# Patient Record
Sex: Male | Born: 1935
Health system: Southern US, Community
[De-identification: ages and names within clinical notes are randomized; demographics above are authoritative.]

## PROBLEM LIST (undated history)

## (undated) DIAGNOSIS — I10 Essential (primary) hypertension: Secondary | ICD-10-CM

## (undated) DIAGNOSIS — I509 Heart failure, unspecified: Secondary | ICD-10-CM

## (undated) DIAGNOSIS — R310 Gross hematuria: Secondary | ICD-10-CM

## (undated) DIAGNOSIS — R55 Syncope and collapse: Secondary | ICD-10-CM

## (undated) DIAGNOSIS — I2510 Atherosclerotic heart disease of native coronary artery without angina pectoris: Secondary | ICD-10-CM

## (undated) DIAGNOSIS — N402 Nodular prostate without lower urinary tract symptoms: Secondary | ICD-10-CM

## (undated) DIAGNOSIS — E785 Hyperlipidemia, unspecified: Secondary | ICD-10-CM

## (undated) DIAGNOSIS — E119 Type 2 diabetes mellitus without complications: Secondary | ICD-10-CM

## (undated) DIAGNOSIS — K4090 Unilateral inguinal hernia, without obstruction or gangrene, not specified as recurrent: Secondary | ICD-10-CM

## (undated) HISTORY — DX: Syncope and collapse: R55

## (undated) HISTORY — DX: Gross hematuria: R31.0

## (undated) HISTORY — DX: Hyperlipidemia, unspecified: E78.5

## (undated) HISTORY — DX: Heart failure, unspecified: I50.9

## (undated) HISTORY — DX: Essential (primary) hypertension: I10

## (undated) HISTORY — PX: OTHER SURGICAL HISTORY: SHX169

## (undated) HISTORY — DX: Type 2 diabetes mellitus without complications: E11.9

## (undated) HISTORY — DX: Atherosclerotic heart disease of native coronary artery without angina pectoris: I25.10

## (undated) HISTORY — DX: Nodular prostate without lower urinary tract symptoms: N40.2

## (undated) HISTORY — DX: Unilateral inguinal hernia, without obstruction or gangrene, not specified as recurrent: K40.90

---

## 2005-01-07 ENCOUNTER — Inpatient Hospital Stay: Payer: Self-pay | Admitting: Internal Medicine

## 2005-01-07 ENCOUNTER — Other Ambulatory Visit: Payer: Self-pay

## 2006-09-11 ENCOUNTER — Other Ambulatory Visit: Payer: Self-pay

## 2006-09-11 ENCOUNTER — Inpatient Hospital Stay: Payer: Self-pay | Admitting: Vascular Surgery

## 2010-01-25 ENCOUNTER — Ambulatory Visit: Payer: Self-pay | Admitting: Family Medicine

## 2011-06-24 ENCOUNTER — Observation Stay: Payer: Self-pay | Admitting: *Deleted

## 2011-06-24 LAB — COMPREHENSIVE METABOLIC PANEL
Albumin: 4.2 g/dL (ref 3.4–5.0)
Anion Gap: 12 (ref 7–16)
BUN: 20 mg/dL — ABNORMAL HIGH (ref 7–18)
Bilirubin,Total: 0.6 mg/dL (ref 0.2–1.0)
Chloride: 101 mmol/L (ref 98–107)
Co2: 29 mmol/L (ref 21–32)
Creatinine: 1.43 mg/dL — ABNORMAL HIGH (ref 0.60–1.30)
EGFR (African American): >60
EGFR (Non-African Amer.): 51 — ABNORMAL LOW
Potassium: 4.1 mmol/L (ref 3.5–5.1)
SGPT (ALT): 24 U/L
Total Protein: 7.4 g/dL (ref 6.4–8.2)

## 2011-06-24 LAB — TROPONIN I
Troponin-I: 0.11 ng/mL — ABNORMAL HIGH
Troponin-I: 0.09 ng/mL — ABNORMAL HIGH

## 2011-06-24 LAB — CK TOTAL AND CKMB (NOT AT ARMC)
CK, Total: 144 U/L (ref 35–232)
CK, Total: 73 U/L (ref 35–232)
CK-MB: 4.3 ng/mL — ABNORMAL HIGH (ref 0.5–3.6)
CK-MB: 2.5 ng/mL (ref 0.5–3.6)

## 2011-06-24 LAB — CBC
HGB: 16.3 g/dL (ref 13.0–18.0)
MCH: 30.5 pg (ref 26.0–34.0)
MCHC: 33.8 g/dL (ref 32.0–36.0)
Platelet: 119 10*3/uL — ABNORMAL LOW (ref 150–440)
RBC: 5.32 10*6/uL (ref 4.40–5.90)
RDW: 13.5 % (ref 11.5–14.5)

## 2011-06-24 LAB — LIPASE, BLOOD: Lipase: 182 U/L (ref 73–393)

## 2011-06-25 LAB — URINALYSIS, COMPLETE
Leukocyte Esterase: NEGATIVE
Ph: 5 (ref 4.5–8.0)
Protein: 100
RBC,UR: 9 /HPF (ref 0–5)
WBC UR: 3 /HPF (ref 0–5)

## 2011-06-25 LAB — BASIC METABOLIC PANEL
Anion Gap: 10 (ref 7–16)
BUN: 21 mg/dL — ABNORMAL HIGH (ref 7–18)
Chloride: 106 mmol/L (ref 98–107)
Co2: 22 mmol/L (ref 21–32)
Creatinine: 1.43 mg/dL — ABNORMAL HIGH (ref 0.60–1.30)
Osmolality: 281 (ref 275–301)
Potassium: 4 mmol/L (ref 3.5–5.1)
Sodium: 138 mmol/L (ref 136–145)

## 2011-06-25 LAB — LIPID PANEL
Cholesterol: 131 mg/dL (ref 0–200)
HDL Cholesterol: 29 mg/dL — ABNORMAL LOW (ref 40–60)
Triglycerides: 70 mg/dL (ref 0–200)
VLDL Cholesterol, Calc: 14 mg/dL (ref 5–40)

## 2011-06-25 LAB — TROPONIN I: Troponin-I: 0.08 ng/mL — ABNORMAL HIGH

## 2011-06-26 LAB — CBC WITH DIFFERENTIAL/PLATELET
Basophil %: 0.9 %
Eosinophil #: 0.1 10*3/uL (ref 0.0–0.7)
Eosinophil %: 2.3 %
HCT: 43.8 % (ref 40.0–52.0)
HGB: 14.3 g/dL (ref 13.0–18.0)
Lymphocyte %: 9.8 %
MCH: 29.7 pg (ref 26.0–34.0)
MCHC: 32.7 g/dL (ref 32.0–36.0)
MCV: 91 fL (ref 80–100)
Monocyte #: 0.6 10*3/uL (ref 0.0–0.7)
Monocyte %: 9.4 %
Neutrophil #: 4.8 10*3/uL (ref 1.4–6.5)
WBC: 6.2 10*3/uL (ref 3.8–10.6)

## 2011-06-26 LAB — BASIC METABOLIC PANEL
Anion Gap: 11 (ref 7–16)
BUN: 19 mg/dL — ABNORMAL HIGH (ref 7–18)
Calcium, Total: 8.8 mg/dL (ref 8.5–10.1)
Chloride: 104 mmol/L (ref 98–107)
EGFR (African American): >60
EGFR (Non-African Amer.): 54 — ABNORMAL LOW
Glucose: 211 mg/dL — ABNORMAL HIGH (ref 65–99)
Osmolality: 284 (ref 275–301)

## 2012-03-11 ENCOUNTER — Ambulatory Visit: Payer: Self-pay | Admitting: Surgery

## 2012-03-11 LAB — CBC WITH DIFFERENTIAL/PLATELET
Basophil %: 0.6 %
Eosinophil #: 0.1 10*3/uL (ref 0.0–0.7)
HCT: 41.2 % (ref 40.0–52.0)
HGB: 14.6 g/dL (ref 13.0–18.0)
MCH: 31.1 pg (ref 26.0–34.0)
MCHC: 35.4 g/dL (ref 32.0–36.0)
MCV: 88 fL (ref 80–100)
Monocyte #: 0.6 x10 3/mm (ref 0.2–1.0)
Neutrophil #: 4.8 10*3/uL (ref 1.4–6.5)
Neutrophil %: 73.8 %
RDW: 13.9 % (ref 11.5–14.5)

## 2012-03-23 ENCOUNTER — Ambulatory Visit: Payer: Self-pay | Admitting: Surgery

## 2012-11-02 ENCOUNTER — Ambulatory Visit: Payer: Self-pay | Admitting: Family Medicine

## 2013-10-04 DIAGNOSIS — I10 Essential (primary) hypertension: Secondary | ICD-10-CM | POA: Insufficient documentation

## 2013-10-04 DIAGNOSIS — I714 Abdominal aortic aneurysm, without rupture, unspecified: Secondary | ICD-10-CM | POA: Insufficient documentation

## 2013-10-04 DIAGNOSIS — E785 Hyperlipidemia, unspecified: Secondary | ICD-10-CM | POA: Insufficient documentation

## 2013-10-04 DIAGNOSIS — R351 Nocturia: Secondary | ICD-10-CM | POA: Insufficient documentation

## 2013-10-04 DIAGNOSIS — I6529 Occlusion and stenosis of unspecified carotid artery: Secondary | ICD-10-CM | POA: Insufficient documentation

## 2014-05-05 DIAGNOSIS — R7309 Other abnormal glucose: Secondary | ICD-10-CM | POA: Diagnosis not present

## 2014-05-05 DIAGNOSIS — R972 Elevated prostate specific antigen [PSA]: Secondary | ICD-10-CM | POA: Diagnosis not present

## 2014-05-23 DIAGNOSIS — I2510 Atherosclerotic heart disease of native coronary artery without angina pectoris: Secondary | ICD-10-CM | POA: Diagnosis not present

## 2014-05-23 DIAGNOSIS — R972 Elevated prostate specific antigen [PSA]: Secondary | ICD-10-CM | POA: Diagnosis not present

## 2014-05-23 DIAGNOSIS — N402 Nodular prostate without lower urinary tract symptoms: Secondary | ICD-10-CM | POA: Diagnosis not present

## 2014-08-08 NOTE — Op Note (Signed)
PATIENT NAME:  Kenneth Mitchell, Kenneth Mitchell MR#:  [id number] DATE OF BIRTH:  November 11, 1933  DATE OF PROCEDURE:  03/23/2012  PREOPERATIVE DIAGNOSIS: Right inguinal hernia.   POSTOPERATIVE DIAGNOSIS: Bilateral inguinal hernias.   SURGERY: Bilateral laparoscopic inguinal hernia repair.   ANESTHESIA: General.   SURGEON: Quentin Orealph L. Ely, III, MD    ASSISTANT: Warren Danesarpenter, PA student   OPERATIVE PROCEDURE: With the patient in the supine position after induction of appropriate general anesthesia and placement of Foley catheter, the patient's abdomen was prepped with ChloraPrep and draped with sterile towels. An infraumbilical midline incision was made in the standard fashion and carried down bluntly through subcutaneous tissue. I had a great deal of difficulty cannulating the abdominal cavity. Veress needle was placed without difficulty and CO2 insufflated satisfactorily. Hanging drop test was normal. I had difficulty with the blunt 11 mm port. For that reason I abandoned that attempt and moved to a Visiport apparatus which was then used to cannulate the abdomen under direct vision. Intraperitoneal position was confirmed. No evidence of injury was identified. There did not appear to be any evidence of significant entrance into the abdominal cavity other than through the Visiport apparatus. Two lateral ports 5 mm in size were inserted. Reviewing the anatomy, there was a clear direct defect on the right side and a small indirect defect on the right side. Looking at the left side, there did appear to be a fairly moderate size direct defect with no indirect defect. The symptomatic right side was approached first. The peritoneum was taken down from lateral umbilical fold laterally across the epigastric vessels to just beyond the internal ring. Peritoneum was dissected back out of the direct defect and the indirect defect creating a window behind the cord structures. Atrium ProLite mesh was brought to the table, appropriately fashioned,  and inserted through the umbilical port. It was placed in the free peritoneal space covering by several centimeters the direct defect and reaching behind the cord structures overlapping on Cooper's ligament. It was secured with the ProTack device. The peritoneum was then tacked back over the mesh without difficulty.   Attention was turned to the left side where the peritoneum was again taken down laterally from the lateral umbilical fold across the epigastric vessels. No attempt was made to dissect the cord structures on the left side. The sac was dissected from the defect and the edges cleaned. Atrium ProLite mesh was inserted into the preperitoneal space, secured in place with the ProTack device, and then the peritoneum tacked back over the defect and mesh with the ProTack device. The abdomen was then desufflated. Midline fascia was closed with figure-of-eight suture of 0 Vicryl. Skin was closed with 5-0 nylon. The area was infiltrated with 0.25% Marcaine for postoperative pain control. Sterile dressings were applied.  ____________________________ Quentin Orealph L. Ely III, MD rle:drc D: 03/23/2012 14:03:59 ET T: 03/23/2012 14:38:16 ET JOB#: [id number]  cc: Carmie Endalph L. Ely III, MD, <Dictator> Duanne Limerickeanna C. Jones, MD Quentin OreALPH L ELY MD ELECTRONICALLY SIGNED 03/23/2012 18:38

## 2014-08-13 NOTE — H&P (Signed)
PATIENT NAME:  Kenneth Mitchell, KRATOCHVIL MR#:  [id number] DATE OF BIRTH:  December 22, 1933  DATE OF ADMISSION:  06/24/2011  PRIMARY CARE PHYSICIAN: Elizabeth Sauer, MD   CHIEF COMPLAINT: Right upper quadrant abdominal pain.   HISTORY OF PRESENT ILLNESS: This is a 81 year old male who presents to the hospital complaining of sharp abdominal pain that began while he was golfing. He was on his 12th hole when he started developing just sharp pain. It was located almost in the epigastric and the right upper quadrant area and it was radiating to his back. It progressively started to get worse and, therefore, he was a bit concerned and was brought to the ER for further evaluation. The patient does have a history of a previous aortic aneurysm repair in 2008 done by Dr. Earnestine Leys. The patient underwent a CT scan of the chest with contrast which showed a descending thoracic upper abdominal aortic dissection. Given the fact of his symptoms and he was also noted to have significantly elevated blood pressures with systolic blood pressures over 045'W to 200's, hospitalist services were contacted for further treatment and evaluation. The patient denied any true chest pain. He denied any shortness of breath, any nausea, any vomiting, any fevers, chills, cough, dizziness, any other associated symptoms presently.   REVIEW OF SYSTEMS: CONSTITUTIONAL: No documented fever. No weight gain, no weight loss. EYES: No blurry or double vision. ENT: No tinnitus, no postnasal drip, no redness of the oropharynx. RESPIRATORY: No cough, no wheeze, no hemoptysis. CARDIOVASCULAR: No chest pain, no orthopnea, no palpitations, no syncope. GI: No nausea, no vomiting, no diarrhea. Positive epigastric right upper quadrant abdominal pain. No melena, no hematochezia. GU: No dysuria, no hematuria. ENDOCRINE: No polyuria or nocturia. No heat or cold intolerance. HEME: No anemia, no bruising, no bleeding. INTEGUMENTARY: No rashes, no lesions. MUSCULOSKELETAL: No arthritis, no  swelling, no gout. NEUROLOGIC: No numbness, no tingling, no ataxia, no seizure-type activity. PSYCH: No anxiety, no insomnia, no ADD.   PAST MEDICAL HISTORY:  1. Hypertension. 2. Coronary disease. 3. History of carotid artery stenosis status post right carotid endarterectomy done 2006. 4. History of abdominal aortic aneurysm with endovascular repair done by Dr. Earnestine Leys in 2008.   ALLERGIES: Keflex.   SOCIAL HISTORY: Used to be a smoker, quit many years ago. No alcohol abuse. No illicit drug abuse. Lives at home by himself.   FAMILY HISTORY: No significant family history of coronary disease or diabetes.   CURRENT MEDICATIONS: He is currently taking no medications.   PHYSICAL EXAMINATION ON ADMISSION:    VITAL SIGNS: Temperature 98.5, pulse 55, respirations 16, blood pressure 139/65, sats 96% on room air.   GENERAL: He is a pleasant appearing male in no apparent distress.   HEENT: Atraumatic, normocephalic. Extraocular muscles are intact. Pupils equal and reactive to light. Sclerae anicteric. No conjunctival injection. No pharyngeal erythema.   NECK: Supple. No jugular venous distention, no bruits, no lymphadenopathy, no thyromegaly.   HEART: Regular rate and rhythm. No murmurs, no rubs, no clicks.   LUNGS: Clear to auscultation bilaterally. No rales, no rhonchi, no wheezes.   ABDOMEN: Soft, flat, nontender, nondistended. Has good bowel sounds. No hepatosplenomegaly appreciated.   EXTREMITIES: No evidence of any cyanosis, clubbing, or peripheral edema. Has +2 pedal and radial pulses bilaterally.   NEUROLOGIC: The patient is alert, awake, and oriented x3 with no focal motor or sensory deficits appreciated bilaterally.   SKIN: Moist and warm with no rashes appreciated.   LYMPHATIC: There is no cervical or axillary  lymphadenopathy.   LABORATORY, DIAGNOSTIC, AND RADIOLOGICAL DATA: Serum glucose 119, BUN 20, creatinine 1.43, sodium 142, potassium 4.1, chloride 101, bicarb 29. The  patient's LFTs are within normal limits. Troponin 0.11. White cell count 6.1, hemoglobin 16.3, hematocrit 48, platelet count 119. D-dimer is elevated at 2.19.   The patient did have a CT scan of the chest done with contrast showing no CT evidence of pulmonary embolus. Findings consistent with a descending thoracic upper abdominal aortic dissection. Extensive dissection is difficult to ascertain. Focal aneurysm at the level of the right renal artery stable when compared to previous study. Cardiomegaly.   ASSESSMENT AND PLAN: This is a 81 year old male with past medical history of coronary disease, history of carotid artery stenosis status post right-sided carotid endarterectomy in 2006, hypertension, history of abdominal aortic aneurysm status post repair in 2008 who came to the hospital with sharp epigastric pain while he was golfing and CT scan showing a descending thoracic and upper abdominal aortic dissection with mural thrombus.  1. Descending thoracic aneurysm with dissection. This is likely the cause of the patient's abdominal pain that happened this afternoon. The patient has been seen by Vascular Surgery by Dr. Wyn Quakerew who does not think that the patient needs urgent surgical intervention. The acute dissection is in the supraceliac area, although there is no evidence of any bowel ischemia. His mural thrombus is actually old. As per Vascular, right now we need to do aggressive medical therapy and blood pressure control as the patient has not been on any blood pressure meds for over a year. I will go ahead and start him on some Norvasc, Toprol, and low dose Imdur and follow his hemodynamics. Vascular Surgery has been consulted. They will continue to follow. If the patient's symptoms persist or get worse, will repeat the CT scan in the next 24 to 48 hours.  2. Malignant hypertension. This is secondary to noncompliance as the patient has not taken any meds in the past year. As mentioned, I will start him on  low dose Toprol, Norvasc, and Imdur. Also, add some p.r.n. hydralazine. Goal blood pressure should be systolics in the 130's, diastolics in the 80's.  3. Elevated troponin. This is likely in the setting of malignant hypertension. He does not complain of any chest pain. He has no acute ST changes on his EKG. For now will continue aspirin, nitro, beta-blocker, get a two-dimensional echocardiogram. Also, give him some morphine and oxygen. I have consulted Cardiology. Dr. Juliann Paresallwood will see the patient. Place him on telemetry. Follow serial cardiac markers for now.  4. History of coronary disease. Continue aspirin, nitro, beta-blocker. I will go ahead and check a lipid profile in the morning.   The plan was discussed with the patient and I also updated the patient's daughter on the plan of action and she was in agreement.   TIME SPENT: 60 minutes.   ____________________________ Rolly PancakeVivek J. Cherlynn KaiserSainani, MD vjs:drc D: 06/24/2011 20:03:14 ET T: 06/25/2011 05:39:51 ET JOB#: [id number]  cc: Rolly PancakeVivek J. Cherlynn KaiserSainani, MD, <Dictator> Duanne Limerickeanna C. Jones, MD Houston SirenVIVEK J Jenaye Rickert MD ELECTRONICALLY SIGNED 06/26/2011 8:15

## 2014-08-13 NOTE — Consult Note (Signed)
Asked to see patient regarding a descending thoracic aortic dissection.  He is s/p repair of aortoiliac aneurysm about 5 years ago.  He is severely hypertensive with SBP >200.  I have reviewed his scan independently.  This shows what appears to be a chronic thoracic aortic aneurysm/dissection with mural thrombus.  These is a small, short segment of active dissection in the supraceliac aorta.  The viscerals are not involved.  He has no evidence of LE ischemia or bowel ischemia.  Would recommend medical management with aggressive BP control.  If symptoms of pain persist, would consider reCT in 24-48 hours.  Will follow  Electronic Signatures: Annice Needyew, Jason S (MD)  (Signed on 05-Mar-13 17:57)  Authored  Last Updated: 05-Mar-13 17:57 by Annice Needyew, Jason S (MD)

## 2014-08-13 NOTE — Discharge Summary (Signed)
PATIENT NAME:  [name], Haseeb P MR#:  [id number] DATE OF BIRTH:  1933/10/28  DATE OF ADMISSION:  06/24/2011 DATE OF DISCHARGE:  06/26/2011  ADDENDUM   LABS AT TIME OF DISCHARGE: His creatinine was improved to 1.37. His hemoglobin is stable around 14.3. His platelet count is low, but stable in the range of 111,000-119,000. His glucose is a little bit high at 200. This can be followed up as outpatient. If his glucose remains high above 200, then he needs to do a hemoglobin A1c as outpatient, but this is only one reading.    ____________________________ Fredia SorrowAbhinav Jeanpierre Thebeau, MD ag:ap D: 06/26/2011 10:47:43 ET T: 06/26/2011 11:42:31 ET JOB#: [id number]  cc: Fredia SorrowAbhinav Jennalynn Rivard, MD, <Dictator> Duanne Limerickeanna C. Jones, MD Fredia SorrowABHINAV Prarthana Parlin MD ELECTRONICALLY SIGNED 06/30/2011 11:34

## 2014-08-13 NOTE — Consult Note (Signed)
PATIENT NAME:  Kenneth Mitchell, Kenneth Mitchell#:  [id number] DATE OF BIRTH:  [birth year]  DATE OF CONSULTATION:  06/25/2011  REFERRING PHYSICIAN:  Elizabeth Sauereanna Jones, MD CONSULTING PHYSICIAN:  Kimoni Pickerill D. Iosefa Weintraub, MD  INDICATION:  Possible non-Q-wave myocardial infarction, chest pain, and aortic dissection.   HISTORY OF PRESENT ILLNESS: Mitchell. Zacarias PontesBedell is a 81 year old male with history of coronary artery disease, coronary artery bypass surgery, abdominal aortic aneurysm, hypertension, hyperlipidemia, and carotid endarterectomy who was playing golf and started having significant chest pain. He felt washed out and had no energy. He had right upper quadrant discomfort radiating to his back. He came to the emergency room. CT of the chest and abdomen suggested a dissecting aortic aneurysm, descending. Blood pressure was 200 at the time. Troponins were also elevated so he was admitted for further evaluation and care.   REVIEW OF SYSTEMS: No blackout spells or syncope. Denies nausea or vomiting. Denies fever, chills, or sweats. No weight loss. No weight gain. No hemoptysis or hematemesis. No bright red blood per rectum. No vision change or hearing change. Denies sputum production. Denies cough.   PAST MEDICAL HISTORY:  1. Hypertension. 2. Coronary artery disease. 3. Peripheral vascular disease. 4. Abdominal aortic aneurysm.  5. Hyperlipidemia.  6. History of angina. 7. Shortness of breath.   PAST SURGICAL HISTORY:  1. Carotid endarterectomy in 2006.  [facility] 3. Abdominal aortic aneurysm endovascular repair in 2008.   FAMILY HISTORY: Negative.   SOCIAL HISTORY: Quit smoking years ago. No alcohol consumption. Retired.   ALLERGIES: Keflex.  MEDICATIONS: He is on blood pressure medicines, but quit taking them.    PHYSICAL EXAMINATION:   VITAL SIGNS: Blood pressure 140/70, pulse 60, respiratory rate 18, afebrile.   HEENT: Normocephalic, atraumatic. Pupils equal and reactive to  light.   NECK: Supple. No jugular venous distention, bruits, or adenopathy.   LUNGS: Clear to auscultation and percussion. No significant wheeze, rhonchi, or rales.   HEART: Regular rate and rhythm. Positive S4. Systolic ejection murmur at the apex.   ABDOMEN: Benign.   EXTREMITIES: Within normal limits.  NEUROLOGIC: Intact.  SKIN: Normal.  LABS/STUDIES: Glucose 119, BUN 20, creatinine 1.43, sodium 142, potassium 4.1, chloride 101. LFTs normal. Troponin 0.11. White count 6.1, hemoglobin 16.3, hematocrit 48, platelet count 119. Dimer 2.18.   EKG: Normal sinus rhythm, nonspecific findings.  CT of the chest and abdomen showed a descending aortic aneurysm dissection.   ASSESSMENT:  1. Thoracic aneurysm dissection.  2. Peripheral vascular disease. 3. Malignant hypertension. 4. Elevated troponin, possible non-Q-wave myocardial infarction.  5. Coronary artery disease.  6. Hyperlipidemia.  7. Peripheral vascular disease.   PLAN: Agree with admit. Follow cardiac enzymes. Follow-up EKGs. Anticoagulation for now. Recommend further evaluation. Consult Vascular. Blood pressure control, beta blockers and nitrates. Anticoagulation as per Vascular. Echocardiogram may be helpful. Lipid therapy. Raise doses of blood pressure medications. Aspirin therapy I think will also be helpful. Recommend conservative cardiac care if no further evaluation will be done for the dissecting aneurysm. We will probably hold off on cardiac catheterization for now. ____________________________ Cedar Roseman D. Juliann Paresallwood, MD ddc:slb D: 06/26/2011 10:09:00 ET T: 06/26/2011 10:30:56 ET JOB#: [id number]  cc: Daphne Karrer D. Juliann Paresallwood, MD, <Dictator> Alwyn PeaWAYNE D Langston Summerfield MD ELECTRONICALLY SIGNED 07/09/2011 8:29

## 2014-08-13 NOTE — Consult Note (Signed)
PATIENT NAME:  Kenneth Mitchell, Kenneth Mitchell MR#:  [id number] DATE OF BIRTH:  16-Apr-1934  DATE OF CONSULTATION:  06/24/2011  REFERRING PHYSICIAN:  Not dictated CONSULTING PHYSICIAN:  Annice NeedyJason S. Skyleen Bentley, MD  REASON FOR CONSULTATION: Thoracic aortic dissection.   HISTORY OF PRESENT ILLNESS: This is a 81 year old male who is status post repair of abdominal aortic aneurysm and iliac aneurysm with a stent graft approximately five years ago. He presents today with abdominal pain and some chest pain. He was found to have blood pressures greater than 200 systolic on admission and in discussion with the patient apparently he stopped taking his blood pressure medicines over a year ago because he felt good.  To evaluate his issues, a CT scan was performed, which I have independently reviewed. This demonstrates a thoracic aortic dissection with aneurysm and mural thrombus that appears reasonably chronic. This is small and well below the size for typical repair and only in the 4-cm range. There is a small short segment of active dissection in the supraceliac aorta. The visceral vessels do not appear to be involved with this dissection. There is no evidence of lower extremity ischemia at this time. His feet are warm and he has not had any foot pain and he does not have any signs of bowel ischemia.  He has not had followup for his aortic stent graft in some time and it is not entirely clear when this was last checked. The physician who performed this procedure has left the community.  PAST MEDICAL HISTORY: 1. Hypertension.  2. Abdominal aortic aneurysm.  3. Carotid artery stenosis status post endarterectomy on the right.  4. Coronary disease.   PAST SURGICAL HISTORY:  1. Abdominal aortic aneurysm repair with endovascular stent graft in 2008. 2. Right carotid endarterectomy in 2006.   ALLERGIES: Keflex.  MEDICATIONS:  Currently he is taking none. Apparently he was on blood pressure medicines, which he stopped himself a year ago.    FAMILY HISTORY: No significant history of coronary disease or diabetes.   SOCIAL HISTORY: Previous smoker, has now quit. No alcohol abuse. He lives at home by himself.   REVIEW OF SYSTEMS: GENERAL:  He has no fevers or chills. No intentional weight loss or gain. EYES: No blurry or double vision. EARS: No tinnitus or ear pain. CARDIOVASCULAR: No palpitations. No true chest pain. Most of his pain is in the upper abdomen and a little in his back. GASTROINTESTINAL: As just discussed. No melena or bright red blood per rectum.  GENITOURINARY: No dysuria or hematuria. ENDOCRINE: No heat or cold intolerance. HEME: No anemia or easy bruising. SKIN: No new rash or ulcers. NEUROLOGIC: No transient ischemic attack, stroke, or seizure. PSYCH: No anxiety or depression. MUSCULOSKELETAL: No joint pain or swelling.   PHYSICAL EXAMINATION:  GENERAL: This is a well-developed, well-nourished white male who is not in acute distress, sitting on the Emergency Room bed reasonably comfortably.   VITAL SIGNS:  His highest blood pressure in the Emergency Room was recorded as 220/171. His most recent blood pressure is 117/58 and he has been started on esmolol drip. Pulse is 50 currently. Temperature is 98.5, saturations greater than 95%.   HEAD: Normocephalic, atraumatic.   EYES: Sclerae anicteric. Conjunctivae are clear.   EARS: Normal external appearance. Hearing appears intact.   NECK: Supple without adenopathy or jugular venous distention. Right carotid endarterectomy scar is well healed. Soft right carotid bruit.   HEART: Regular rate and rhythm.   LUNGS: Clear to auscultation bilaterally.   ABDOMEN:  Soft, nondistended. He has some mild epigastric and right upper quadrant tenderness. No rebound or guarding.   EXTREMITIES: Warm and well perfused. He has 1+ dorsalis pedis and posterior tibial pulses  and good capillary refill.   NEUROLOGIC: Normal strength and tone in all four extremities.   SKIN: Warm and  dry.   PSYCH: Normal affect and mood.  LABORATORY DATA: Sodium 142, potassium 4.1, chloride 101, CO2 29, BUN 20, creatinine 1.43, glucose 119. Troponin was 0.11, CK 144, CK-MB 4.3. White blood cell count 6.1, hemoglobin 16.3, platelet count 119,000. D-dimer was 2.19. Imaging studies as described above.   ASSESSMENT AND PLAN: This is a 81 year old white male with what appears to be a chronic thoracic aortic dissection associated with a small aneurysm. There is an area of active dissection seen on the CT scan today in the supraceliac aorta. This does not involve the visceral vessels. I suspect that his significant hypertension in association with this could be contributing to his pain but is likely not a true acute dissection. He also is complaining of less pain now that he is less hypertensive, which is not surprising. At this point he has no signs of bowel, kidney, or lower extremity malperfusion. There would be no indication for acute intervention.  He does have chronic enlargement of his descending thoracic aorta, but this is below the size for typical repair. Again, I would suspect much of this issue is chronic from his not taking his blood pressure medicines as need be and could have been present as far back as his previous abdominal aortic aneurysm repair back in 2008. This will need to be followed as well and I will be happy to do that on an outpatient basis. He has no indication for acute intervention. I would recommend aggressive blood pressure control with antihypertensives via IV if necessary. If he still has persistent pain or other symptoms in 24 to 48 hours, a repeat CT angiogram would be recommended. I will follow along and help as needed.  This is level-4 consultation. ____________________________ Annice Needy, MD jsd:bjt D: 07/16/2011 16:36:22 ET T: 07/17/2011 08:41:54 ET JOB#: [id number]  cc: Annice Needy, MD, <Dictator> Annice Needy MD ELECTRONICALLY SIGNED 07/23/2011 15:03

## 2014-08-13 NOTE — Discharge Summary (Signed)
PATIENT NAME:  Kenneth Mitchell, Kenneth Mitchell MR#:  [id number] DATE OF BIRTH:  Sep 06, 1933  DATE OF ADMISSION:  06/24/2011 DATE OF DISCHARGE:  06/26/2011  DISCHARGE DIAGNOSES:  1. Abdominal pain secondary to abdominal aortic aneurysm dissection, which is chronic with mural thrombus and with some component of focal dissection. 2. Malignant hypertension, resolved.  3. Coronary artery disease. 4. Chronic kidney disease, stage II to III. 5. Elevated troponins most likely secondary to malignant hypertension. 6. Carotid artery disease.   CONSULTANTS:  1. Festus Barren, MD - Vascular Surgery. 2. Dorothyann Peng, MD - Cardiology.  HOSPITAL COURSE: This is a 81 year old male who is a vasculopath with previous history of coronary artery disease, previous coronary artery bypass graft. He has hypertension, carotid artery disease with previous right carotid endarterectomy and history of abdominal aortic aneurysm status post endovascular repair by Dr. Earnestine Leys in 2008. He has stopped taking all his blood pressure medications for about one year. He presented with right upper quadrant abdominal pain and epigastric pain. When he came to the Emergency Room, he had an elevated d-dimer of 2.19. His creatinine was around 1.43. He had a CT of the chest done with contrast which showed that the patient had no PE but findings consistent with a descending thoracic/ upper abdominal aortic dissection. The extent of dissection is difficult to ascertain and there is a focal aneurysm at the level of the right renal artery, stable when compared to previous study. Vascular surgery was consulted because of this dissection with a history of abdominal aortic aneurysm and status post repair. Dr. Wyn Quaker saw him at the time of admission. He had repair of an aortoiliac aneurysm about five years ago. When he presented he was very hypertensive with a blood pressure greater than 200. He reviewed his scan and the scan showed a chronic thoracic aortic aneurysm dissection  with a mural thrombus and there was a small short segment of active dissection in the supraceliac aorta. He has no evidence of lower extremity ischemia or bowel ischemia. He advised medical management with aggressive blood pressure control and if his pain persists he advised to reconsider CT in 24 to 48 hours. So the patient was admitted as acute on chronic abdominal aortic aneurysm dissection along with malignant hypertension. He was started on blood pressure medications. He was started on Norvasc 10 mg daily, Imdur 30 mg daily, and Toprol-XL 12.5 mg daily. His blood pressure was fairly well controlled on these blood pressure medications. He had no further abdominal pain or epigastric pain. Vascular Surgery wants to follow him as an outpatient for his abdominal aortic aneurysmal dissection. He is going to keep a watch on his thoracic aorta.   When he came in, he had some mildly elevated troponins in the range of 0.11 and that has improved to 0.08 and 0.9. No cardiac work-up was needed. He had an echocardiogram done which showed that he has an ejection fraction of greater than 55%, mild concentric left ventricular hypertrophy, right ventricular function is normal, mild to moderate TR, and discrete nodular thickening of the noncoronary cusp. His EKG when he came in showed that he had sinus bradycardia at 57 with right bundle branch block. His creatinine remained stable in the range of 1.43, so most likely he has a component of chronic kidney disease, stage II. His lipid profile shows a LDL of 88.  DISCHARGE MEDICATIONS:   1. Ecotrin 81 mg p.o. once daily. 2. Amlodipine 10 mg p.o. once daily. 3. Imdur 30 mg p.o. once  daily. 4. Metoprolol ER 25 mg p.o. once daily.   DISCHARGE INSTRUCTIONS: I advised a low sodium, low cholesterol diet. Advised to be compliant with his medications.   CONDITION AT DISCHARGE: He is comfortable. T-max is 98, heart rate 70, blood pressure 148/69, and saturating 95% on room air.  He had no further abdominal pain. No chest pain. He is ambulating around. Chest is clear. Abdomen is soft, nontender.   DISCHARGE FOLLOWUP: The patient should follow up with Dr. Elizabeth Sauereanna Jones in 1 to 2 weeks. Have followup hemoglobin and BMP in Dr. Yetta BarreJones' office. Followup with Dr. Wyn Quakerew, vascular surgery.   TIME SPENT ON DISCHARGE: 40 minutes.  ____________________________ Fredia SorrowAbhinav Sem Mccaughey, MD ag:slb D: 06/26/2011 10:12:38 ET T: 06/26/2011 12:30:56 ET JOB#: [id number]  cc: Fredia SorrowAbhinav Gini Caputo, MD, <Dictator> Duanne Limerickeanna C. Jones, MD Annice NeedyJason S. Dew, MD Fredia SorrowABHINAV Hajime Asfaw MD ELECTRONICALLY SIGNED 06/30/2011 11:36

## 2014-11-29 ENCOUNTER — Ambulatory Visit (INDEPENDENT_AMBULATORY_CARE_PROVIDER_SITE_OTHER): Payer: Medicare Other | Admitting: Family Medicine

## 2014-11-29 ENCOUNTER — Encounter: Payer: Self-pay | Admitting: Family Medicine

## 2014-11-29 VITALS — BP 130/70 | HR 64 | Ht 71.0 in | Wt 198.0 lb

## 2014-11-29 DIAGNOSIS — J0100 Acute maxillary sinusitis, unspecified: Secondary | ICD-10-CM | POA: Diagnosis not present

## 2014-11-29 DIAGNOSIS — I10 Essential (primary) hypertension: Secondary | ICD-10-CM

## 2014-11-29 DIAGNOSIS — E785 Hyperlipidemia, unspecified: Secondary | ICD-10-CM

## 2014-11-29 DIAGNOSIS — I2510 Atherosclerotic heart disease of native coronary artery without angina pectoris: Secondary | ICD-10-CM | POA: Diagnosis not present

## 2014-11-29 MED ORDER — LOVASTATIN 20 MG PO TABS: 20.0000 mg | ORAL_TABLET | Freq: Every day | ORAL | Status: DC

## 2014-11-29 MED ORDER — ISOSORBIDE MONONITRATE ER 30 MG PO TB24: 30.0000 mg | ORAL_TABLET | Freq: Every day | ORAL | Status: DC

## 2014-11-29 MED ORDER — HYDROCHLOROTHIAZIDE 25 MG PO TABS: 25.0000 mg | ORAL_TABLET | Freq: Every day | ORAL | Status: DC

## 2014-11-29 MED ORDER — AMLODIPINE BESYLATE 10 MG PO TABS: 10.0000 mg | ORAL_TABLET | Freq: Every day | ORAL | Status: DC

## 2014-11-29 MED ORDER — AMOXICILLIN 500 MG PO CAPS: 500.0000 mg | ORAL_CAPSULE | Freq: Three times a day (TID) | ORAL | Status: DC

## 2014-11-29 MED ORDER — METOPROLOL SUCCINATE ER 25 MG PO TB24: 25.0000 mg | ORAL_TABLET | Freq: Every day | ORAL | Status: DC

## 2014-11-29 MED ORDER — CLOPIDOGREL BISULFATE 75 MG PO TABS: 75.0000 mg | ORAL_TABLET | Freq: Every day | ORAL | Status: DC

## 2014-11-29 NOTE — Progress Notes (Signed)
Name: Kenneth Mitchell   MRN: [id number]    DOB: [birth year]   Date:11/29/2014       Progress Note  Subjective  Chief Complaint  Chief Complaint  Patient presents with  . Hyperlipidemia  . Hypertension    Hyperlipidemia This is a chronic problem. The current episode started more than 1 year ago. The problem is controlled. Recent lipid tests were reviewed and are normal. He has no history of chronic renal disease, diabetes, hypothyroidism, liver disease, obesity or nephrotic syndrome. There are no known factors aggravating his hyperlipidemia. Pertinent negatives include no chest pain, focal sensory loss, focal weakness, leg pain, myalgias or shortness of breath. He is currently on no antihyperlipidemic treatment. The current treatment provides mild improvement of lipids. There are no compliance problems.  Risk factors for coronary artery disease include diabetes mellitus, hypertension and male sex.  Hypertension This is a recurrent problem. The current episode started in the past 7 days. The problem has been waxing and waning since onset. The problem is controlled. Pertinent negatives include no anxiety, blurred vision, chest pain, headaches, malaise/fatigue, neck pain, orthopnea, palpitations, peripheral edema, PND, shortness of breath or sweats. There are no associated agents to hypertension. There are no known risk factors for coronary artery disease. There are no compliance problems.  There is no history of angina, kidney disease, CAD/MI, CVA, heart failure, left ventricular hypertrophy, PVD, renovascular disease or retinopathy. There is no history of chronic renal disease.    No problem-specific assessment & plan notes found for this encounter.   Past Medical History  Diagnosis Date  . CAD (coronary artery disease)   . Hypertension   . Hyperlipidemia     Past Surgical History  Procedure Laterality Date  . Quadrupal bypass    . Aneurysm surgery      x 2    Family History  Problem  Relation Age of Onset  . Diabetes Mother   . Heart disease Father   . Stroke Father     Social History   Social History  . Marital Status: Married    Spouse Name: N/A  . Number of Children: N/A  . Years of Education: N/A   Occupational History  . Not on file.   Social History Main Topics  . Smoking status: Former Games developer  . Smokeless tobacco: Not on file  . Alcohol Use: No  . Drug Use: No  . Sexual Activity: No   Other Topics Concern  . Not on file   Social History Narrative  . No narrative on file    Not on File   Review of Systems  Constitutional: Negative for fever, chills, weight loss and malaise/fatigue.  HENT: Negative for ear discharge, ear pain and sore throat.   Eyes: Negative for blurred vision.  Respiratory: Negative for cough, sputum production, shortness of breath and wheezing.   Cardiovascular: Negative for chest pain, palpitations, orthopnea, leg swelling and PND.  Gastrointestinal: Negative for heartburn, nausea, abdominal pain, diarrhea, constipation, blood in stool and melena.  Genitourinary: Negative for dysuria, urgency, frequency and hematuria.  Musculoskeletal: Negative for myalgias, back pain, joint pain and neck pain.  Skin: Negative for rash.  Neurological: Negative for dizziness, tingling, sensory change, focal weakness and headaches.  Endo/Heme/Allergies: Negative for environmental allergies and polydipsia. Does not bruise/bleed easily.  Psychiatric/Behavioral: Negative for depression and suicidal ideas. The patient is not nervous/anxious and does not have insomnia.      Objective  Filed Vitals:   11/29/14 0817  BP:  130/70  Pulse: 64  Height: 5\' 11"  (1.803 m)  Weight: 198 lb (89.812 kg)    Physical Exam  Constitutional: He is oriented to person, place, and time and well-developed, well-nourished, and in no distress.  HENT:  Head: Normocephalic.  Right Ear: External ear normal.  Left Ear: External ear normal.  Nose: Nose  normal.  Mouth/Throat: Oropharynx is clear and moist.  Eyes: Conjunctivae and EOM are normal. Pupils are equal, round, and reactive to light. Right eye exhibits no discharge. Left eye exhibits no discharge. No scleral icterus.  Neck: Normal range of motion. Neck supple. No JVD present. No tracheal deviation present. No thyromegaly present.  Cardiovascular: Normal rate, regular rhythm, normal heart sounds and intact distal pulses.  Exam reveals no gallop and no friction rub.   No murmur heard. Pulmonary/Chest: Breath sounds normal. No respiratory distress. He has no wheezes. He has no rales.  Abdominal: Soft. Bowel sounds are normal. He exhibits no mass. There is no hepatosplenomegaly. There is no tenderness. There is no rebound, no guarding and no CVA tenderness.  Musculoskeletal: Normal range of motion. He exhibits no edema or tenderness.  Lymphadenopathy:    He has no cervical adenopathy.  Neurological: He is alert and oriented to person, place, and time. He has normal sensation, normal strength, normal reflexes and intact cranial nerves. No cranial nerve deficit.  Skin: Skin is warm. No rash noted.  Psychiatric: Mood and affect normal.      Assessment & Plan  Problem List Items Addressed This Visit      Cardiovascular and Mediastinum   Arteriosclerosis of coronary artery   Relevant Medications   amLODipine (NORVASC) 10 MG tablet   clopidogrel (PLAVIX) 75 MG tablet   hydrochlorothiazide (HYDRODIURIL) 25 MG tablet   isosorbide mononitrate (IMDUR) 30 MG 24 hr tablet   lovastatin (MEVACOR) 20 MG tablet   metoprolol succinate (TOPROL-XL) 25 MG 24 hr tablet   BP (high blood pressure) - Primary   Relevant Medications   amLODipine (NORVASC) 10 MG tablet   hydrochlorothiazide (HYDRODIURIL) 25 MG tablet   isosorbide mononitrate (IMDUR) 30 MG 24 hr tablet   lovastatin (MEVACOR) 20 MG tablet   metoprolol succinate (TOPROL-XL) 25 MG 24 hr tablet   Other Relevant Orders   Renal Function  Panel     Other   Dyslipidemia   Relevant Medications   lovastatin (MEVACOR) 20 MG tablet   Other Relevant Orders   Lipid Profile    Other Visit Diagnoses    Acute maxillary sinusitis, recurrence not specified        Relevant Medications    amoxicillin (AMOXIL) 500 MG capsule         Dr. [facility]  11/29/2014

## 2014-11-30 LAB — RENAL FUNCTION PANEL
ALBUMIN: 4.8 g/dL (ref 3.5–4.8)
BUN/Creatinine Ratio: 15 (ref 10–22)
BUN: 23 mg/dL (ref 8–27)
CALCIUM: 9.7 mg/dL (ref 8.6–10.2)
CO2: 24 mmol/L (ref 18–29)
Chloride: 95 mmol/L — ABNORMAL LOW (ref 97–108)
Creatinine, Ser: 1.51 mg/dL — ABNORMAL HIGH (ref 0.76–1.27)
GFR calc Af Amer: 50 mL/min/{1.73_m2} — ABNORMAL LOW (ref >59)
GFR, EST NON AFRICAN AMERICAN: 44 mL/min/{1.73_m2} — AB (ref >59)
GLUCOSE: 274 mg/dL — AB (ref 65–99)
PHOSPHORUS: 2 mg/dL — AB (ref 2.5–4.5)
Potassium: 3.8 mmol/L (ref 3.5–5.2)
SODIUM: 138 mmol/L (ref 134–144)

## 2014-11-30 LAB — LIPID PANEL
Chol/HDL Ratio: 5.7 ratio units — ABNORMAL HIGH (ref 0.0–5.0)
Cholesterol, Total: 155 mg/dL (ref 100–199)
HDL: 27 mg/dL — ABNORMAL LOW (ref >39)
LDL Calculated: 74 mg/dL (ref 0–99)
Triglycerides: 271 mg/dL — ABNORMAL HIGH (ref 0–149)
VLDL CHOLESTEROL CAL: 54 mg/dL — AB (ref 5–40)

## 2014-12-08 ENCOUNTER — Encounter: Payer: Self-pay | Admitting: Family Medicine

## 2014-12-08 ENCOUNTER — Ambulatory Visit (INDEPENDENT_AMBULATORY_CARE_PROVIDER_SITE_OTHER): Payer: Medicare Other | Admitting: Family Medicine

## 2014-12-08 VITALS — BP 128/80 | HR 64 | Ht 71.0 in | Wt 184.0 lb

## 2014-12-08 DIAGNOSIS — T148 Other injury of unspecified body region: Secondary | ICD-10-CM | POA: Diagnosis not present

## 2014-12-08 DIAGNOSIS — W57XXXA Bitten or stung by nonvenomous insect and other nonvenomous arthropods, initial encounter: Secondary | ICD-10-CM | POA: Diagnosis not present

## 2014-12-08 MED ORDER — TRIAMCINOLONE ACETONIDE 0.1 % EX CREA: 1.0000 "application " | TOPICAL_CREAM | Freq: Two times a day (BID) | CUTANEOUS | Status: DC

## 2014-12-08 MED ORDER — PREDNISONE 10 MG PO TABS
ORAL_TABLET | ORAL | Status: DC
Start: 2014-12-08 — End: 2015-03-19

## 2014-12-08 NOTE — Progress Notes (Signed)
Name: Kenneth Mitchell   MRN: [id number]    DOB: 10-17-1933   Date:12/08/2014       Progress Note  Subjective  Chief Complaint  Chief Complaint  Patient presents with  . Rash    itches- all over body    Rash This is a new problem. The current episode started 1 to 4 weeks ago. The problem has been gradually worsening since onset. The affected locations include the neck, left ankle, left lower leg, right ankle, right lowerleg, torso, right arm and left arm. The rash is characterized by itchiness and swelling. He was exposed to an insect bite/sting. Pertinent negatives include no anorexia, cough, diarrhea, fatigue, fever, joint pain, shortness of breath or sore throat. Past treatments include anti-itch cream. The treatment provided mild relief.    No problem-specific assessment & plan notes found for this encounter.   Past Medical History  Diagnosis Date  . CAD (coronary artery disease)   . Hypertension   . Hyperlipidemia     Past Surgical History  Procedure Laterality Date  . Quadrupal bypass    . Aneurysm surgery      x 2    Family History  Problem Relation Age of Onset  . Diabetes Mother   . Heart disease Father   . Stroke Father     Social History   Social History  . Marital Status: Married    Spouse Name: N/A  . Number of Children: N/A  . Years of Education: N/A   Occupational History  . Not on file.   Social History Main Topics  . Smoking status: Former Games developer  . Smokeless tobacco: Not on file  . Alcohol Use: No  . Drug Use: No  . Sexual Activity: No   Other Topics Concern  . Not on file   Social History Narrative    No Known Allergies   Review of Systems  Constitutional: Negative for fever, chills, weight loss, malaise/fatigue and fatigue.  HENT: Negative for ear discharge, ear pain and sore throat.   Eyes: Negative for blurred vision.  Respiratory: Negative for cough, sputum production, shortness of breath and wheezing.   Cardiovascular:  Negative for chest pain, palpitations and leg swelling.  Gastrointestinal: Negative for heartburn, nausea, abdominal pain, diarrhea, constipation, blood in stool, melena and anorexia.  Genitourinary: Negative for dysuria, urgency, frequency and hematuria.  Musculoskeletal: Negative for myalgias, back pain, joint pain and neck pain.  Skin: Negative for rash.  Neurological: Negative for dizziness, tingling, sensory change, focal weakness and headaches.  Endo/Heme/Allergies: Negative for environmental allergies and polydipsia. Does not bruise/bleed easily.  Psychiatric/Behavioral: Negative for depression and suicidal ideas. The patient is not nervous/anxious and does not have insomnia.      Objective  Filed Vitals:   12/08/14 1413  BP: 128/80  Pulse: 64  Height: 5\' 11"  (1.803 m)  Weight: 184 lb (83.462 kg)    Physical Exam  Constitutional: He is oriented to person, place, and time and well-developed, well-nourished, and in no distress.  HENT:  Head: Normocephalic.  Right Ear: External ear normal.  Left Ear: External ear normal.  Nose: Nose normal.  Mouth/Throat: Oropharynx is clear and moist.  Eyes: Conjunctivae and EOM are normal. Pupils are equal, round, and reactive to light. Right eye exhibits no discharge. Left eye exhibits no discharge. No scleral icterus.  Neck: Normal range of motion. Neck supple. No JVD present. No tracheal deviation present. No thyromegaly present.  Cardiovascular: Normal rate, regular rhythm, normal heart sounds and  intact distal pulses.  Exam reveals no gallop and no friction rub.   No murmur heard. Pulmonary/Chest: Breath sounds normal. No respiratory distress. He has no wheezes. He has no rales.  Abdominal: Soft. Bowel sounds are normal. He exhibits no mass. There is no hepatosplenomegaly. There is no tenderness. There is no rebound, no guarding and no CVA tenderness.  Musculoskeletal: Normal range of motion. He exhibits no edema or tenderness.   Lymphadenopathy:    He has no cervical adenopathy.  Neurological: He is alert and oriented to person, place, and time. He has normal sensation, normal strength, normal reflexes and intact cranial nerves. No cranial nerve deficit.  Skin: Skin is warm. Rash noted. There is erythema.  Psychiatric: Mood and affect normal.      Assessment & Plan  Problem List Items Addressed This Visit      Other   Insect bite - Primary   Relevant Medications   predniSONE (DELTASONE) 10 MG tablet   triamcinolone cream (KENALOG) 0.1 %        Dr. [facility]  12/08/2014

## 2014-12-14 ENCOUNTER — Other Ambulatory Visit: Payer: Self-pay

## 2014-12-14 DIAGNOSIS — W57XXXA Bitten or stung by nonvenomous insect and other nonvenomous arthropods, initial encounter: Secondary | ICD-10-CM

## 2014-12-14 MED ORDER — TRIAMCINOLONE ACETONIDE 0.1 % EX CREA: 1.0000 "application " | TOPICAL_CREAM | Freq: Two times a day (BID) | CUTANEOUS | Status: DC

## 2015-01-12 ENCOUNTER — Other Ambulatory Visit: Payer: Self-pay

## 2015-01-22 ENCOUNTER — Other Ambulatory Visit: Payer: Self-pay

## 2015-01-22 DIAGNOSIS — I2510 Atherosclerotic heart disease of native coronary artery without angina pectoris: Secondary | ICD-10-CM

## 2015-01-22 DIAGNOSIS — E785 Hyperlipidemia, unspecified: Secondary | ICD-10-CM

## 2015-01-22 DIAGNOSIS — I10 Essential (primary) hypertension: Secondary | ICD-10-CM

## 2015-01-22 MED ORDER — HYDROCHLOROTHIAZIDE 25 MG PO TABS: 25.0000 mg | ORAL_TABLET | Freq: Every day | ORAL | Status: DC

## 2015-01-22 MED ORDER — CLOPIDOGREL BISULFATE 75 MG PO TABS: 75.0000 mg | ORAL_TABLET | Freq: Every day | ORAL | Status: DC

## 2015-01-22 MED ORDER — LOVASTATIN 20 MG PO TABS: 20.0000 mg | ORAL_TABLET | Freq: Every day | ORAL | Status: DC

## 2015-01-22 MED ORDER — METOPROLOL SUCCINATE ER 25 MG PO TB24: 25.0000 mg | ORAL_TABLET | Freq: Every day | ORAL | Status: DC

## 2015-01-22 MED ORDER — AMLODIPINE BESYLATE 10 MG PO TABS: 10.0000 mg | ORAL_TABLET | Freq: Every day | ORAL | Status: DC

## 2015-01-22 MED ORDER — ISOSORBIDE MONONITRATE ER 30 MG PO TB24: 30.0000 mg | ORAL_TABLET | Freq: Every day | ORAL | Status: DC

## 2015-03-02 ENCOUNTER — Other Ambulatory Visit: Payer: Self-pay

## 2015-03-19 ENCOUNTER — Encounter: Payer: Self-pay | Admitting: Family Medicine

## 2015-03-19 ENCOUNTER — Ambulatory Visit (INDEPENDENT_AMBULATORY_CARE_PROVIDER_SITE_OTHER): Payer: Medicare Other | Admitting: Family Medicine

## 2015-03-19 VITALS — BP 120/80 | HR 72 | Ht 70.0 in | Wt 187.0 lb

## 2015-03-19 DIAGNOSIS — E119 Type 2 diabetes mellitus without complications: Secondary | ICD-10-CM | POA: Diagnosis not present

## 2015-03-19 DIAGNOSIS — R319 Hematuria, unspecified: Secondary | ICD-10-CM | POA: Diagnosis not present

## 2015-03-19 DIAGNOSIS — Z1211 Encounter for screening for malignant neoplasm of colon: Secondary | ICD-10-CM | POA: Diagnosis not present

## 2015-03-19 DIAGNOSIS — I10 Essential (primary) hypertension: Secondary | ICD-10-CM | POA: Diagnosis not present

## 2015-03-19 DIAGNOSIS — R310 Gross hematuria: Secondary | ICD-10-CM

## 2015-03-19 DIAGNOSIS — R739 Hyperglycemia, unspecified: Secondary | ICD-10-CM | POA: Diagnosis not present

## 2015-03-19 DIAGNOSIS — N183 Chronic kidney disease, stage 3 unspecified: Secondary | ICD-10-CM

## 2015-03-19 DIAGNOSIS — N390 Urinary tract infection, site not specified: Secondary | ICD-10-CM | POA: Diagnosis not present

## 2015-03-19 LAB — POCT URINALYSIS DIPSTICK
Bilirubin, UA: NEGATIVE
Glucose, UA: 500
KETONES UA: NEGATIVE
Nitrite, UA: NEGATIVE
PH UA: 6
PROTEIN UA: NEGATIVE
SPEC GRAV UA: 1.01
UROBILINOGEN UA: 0.2

## 2015-03-19 LAB — HEMOCCULT GUIAC POC 1CARD (OFFICE): Fecal Occult Blood, POC: NEGATIVE

## 2015-03-19 LAB — POCT CBG (FASTING - GLUCOSE)-MANUAL ENTRY: GLUCOSE FASTING, POC: 237 mg/dL — AB (ref 70–99)

## 2015-03-19 MED ORDER — SULFAMETHOXAZOLE-TRIMETHOPRIM 800-160 MG PO TABS: 1.0000 | ORAL_TABLET | Freq: Two times a day (BID) | ORAL | Status: DC

## 2015-03-19 NOTE — Progress Notes (Signed)
Name: Kenneth Mitchell P Dykema   MRN: [id number]    DOB: 02/13/1934   Date:03/19/2015       Progress Note  Subjective  Chief Complaint  Chief Complaint  Patient presents with  . Hematuria    urinated x 2 weeks ago- urine was "purple" and had a painful sensation. For the past 2 days- urine has been clear with no pain    Hematuria This is a recurrent problem. The current episode started in the past 7 days. The problem has been waxing and waning since onset. He describes the hematuria as gross hematuria. The hematuria occurs during the initial portion of his urinary stream. He reports clotting at the middle of his urine stream. Pain scale: "strange sensation" The pain is mild. He describes his urine color as dark red. Irritative symptoms do not include frequency, nocturia or urgency. Obstructive symptoms do not include dribbling, incomplete emptying, an intermittent stream, a slower stream, straining or a weak stream. Pertinent negatives include no abdominal pain, chills, dysuria, fever, inability to urinate or nausea. There is no history of BPH or kidney stones.  Diabetes He presents for his initial diabetic visit. He has type 2 diabetes mellitus. Pertinent negatives for hypoglycemia include no confusion, dizziness, headaches, hunger, mood changes, nervousness/anxiousness, pallor, seizures, sleepiness, speech difficulty or sweats. Associated symptoms include polydipsia, polyphagia and polyuria. Pertinent negatives for diabetes include no blurred vision, no chest pain, no fatigue, no foot paresthesias, no foot ulcerations, no visual change, no weakness and no weight loss. Pertinent negatives for hypoglycemia complications include no blackouts, no hospitalization, no nocturnal hypoglycemia, no required assistance and no required glucagon injection. Symptoms are improving. Diabetic complications include heart disease. Pertinent negatives for diabetic complications include no autonomic neuropathy, CVA, impotence,  nephropathy, peripheral neuropathy, PVD or retinopathy. Risk factors for coronary artery disease include dyslipidemia, male sex, hypertension and tobacco exposure. When asked about current treatments, none were reported. He participates in exercise daily.    No problem-specific assessment & plan notes found for this encounter.   Past Medical History  Diagnosis Date  . CAD (coronary artery disease)   . Hypertension   . Hyperlipidemia     Past Surgical History  Procedure Laterality Date  . Quadrupal bypass    . Aneurysm surgery      x 2    Family History  Problem Relation Age of Onset  . Diabetes Mother   . Heart disease Father   . Stroke Father     Social History   Social History  . Marital Status: Married    Spouse Name: N/A  . Number of Children: N/A  . Years of Education: N/A   Occupational History  . Not on file.   Social History Main Topics  . Smoking status: Former Games developermoker  . Smokeless tobacco: Not on file  . Alcohol Use: No  . Drug Use: No  . Sexual Activity: No   Other Topics Concern  . Not on file   Social History Narrative    No Known Allergies   Review of Systems  Constitutional: Negative for fever, chills, weight loss, malaise/fatigue and fatigue.  HENT: Negative for ear discharge, ear pain and sore throat.   Eyes: Negative for blurred vision.  Respiratory: Negative for cough, sputum production, shortness of breath and wheezing.   Cardiovascular: Negative for chest pain, palpitations and leg swelling.  Gastrointestinal: Negative for heartburn, nausea, abdominal pain, diarrhea, constipation, blood in stool and melena.  Genitourinary: Positive for hematuria. Negative for dysuria, urgency,  frequency, impotence, incomplete emptying and nocturia.  Musculoskeletal: Negative for myalgias, back pain, joint pain and neck pain.  Skin: Negative for pallor and rash.  Neurological: Negative for dizziness, tingling, sensory change, focal weakness, seizures,  speech difficulty, weakness and headaches.  Endo/Heme/Allergies: Positive for polydipsia and polyphagia. Negative for environmental allergies. Does not bruise/bleed easily.  Psychiatric/Behavioral: Negative for depression, suicidal ideas and confusion. The patient is not nervous/anxious and does not have insomnia.      Objective  Filed Vitals:   03/19/15 0958  BP: 120/80  Pulse: 72  Height:  (1.778 m)  Weight: 187 lb (84.823 kg)    Physical Exam  Constitutional: He is oriented to person, place, and time and well-developed, well-nourished, and in no distress.  HENT:  Head: Normocephalic.  Right Ear: External ear normal.  Left Ear: External ear normal.  Nose: Nose normal.  Mouth/Throat: Oropharynx is clear and moist.  Eyes: Conjunctivae and EOM are normal. Pupils are equal, round, and reactive to light. Right eye exhibits no discharge. Left eye exhibits no discharge. No scleral icterus.  Neck: Normal range of motion. Neck supple. No JVD present. No tracheal deviation present. No thyromegaly present.  Cardiovascular: Normal rate, regular rhythm, normal heart sounds and intact distal pulses.  Exam reveals no gallop and no friction rub.   No murmur heard. Pulmonary/Chest: Breath sounds normal. No respiratory distress. He has no wheezes. He has no rales.  Abdominal: Soft. Bowel sounds are normal. He exhibits no mass. There is no hepatosplenomegaly. There is no tenderness. There is no rebound, no guarding and no CVA tenderness.  Genitourinary: Rectum normal, prostate normal and testes/scrotum normal. Guaiac negative stool.  Musculoskeletal: Normal range of motion. He exhibits no edema or tenderness.  Lymphadenopathy:    He has no cervical adenopathy.  Neurological: He is alert and oriented to person, place, and time. He has normal sensation, normal strength, normal reflexes and intact cranial nerves. No cranial nerve deficit.  Skin: Skin is warm. No rash noted.  Psychiatric: Mood  and affect normal.  Nursing note and vitals reviewed.     Assessment & Plan  Problem List Items Addressed This Visit      Cardiovascular and Mediastinum   BP (high blood pressure)   Relevant Medications   aspirin 81 MG tablet    Other Visit Diagnoses    Hematuria, gross    -  Primary    Relevant Orders    POCT Urinalysis Dipstick (Completed)    Ambulatory referral to Urology    Hyperglycemia        Relevant Orders    POCT CBG (Fasting - Glucose) (Completed)    HgB A1c    Renal Function Panel    CKD (chronic kidney disease) stage 3, GFR 30-59 ml/min        Relevant Orders    Renal Function Panel    Colon cancer screening        Relevant Orders    POCT Occult Blood Stool (Completed)    Urinary tract infection with hematuria, site unspecified        Relevant Medications    sulfamethoxazole-trimethoprim (BACTRIM DS,SEPTRA DS) 800-160 MG tablet         Dr. [facility]  03/19/2015

## 2015-03-20 ENCOUNTER — Encounter: Payer: Self-pay | Admitting: Urology

## 2015-03-20 ENCOUNTER — Ambulatory Visit: Payer: Medicare Other | Admitting: Urology

## 2015-03-20 LAB — HEMOGLOBIN A1C
Est. average glucose Bld gHb Est-mCnc: 275 mg/dL
Hgb A1c MFr Bld: 11.2 % — ABNORMAL HIGH (ref 4.8–5.6)

## 2015-03-20 LAB — RENAL FUNCTION PANEL: Phosphorus: 2.5 mg/dL (ref 2.5–4.5)

## 2015-03-20 NOTE — Addendum Note (Signed)
Addended by: Everitt AmberLYNCH, Blessings Inglett L on: 03/20/2015 12:56 PM   Modules accepted: Orders

## 2015-03-21 MED ORDER — METFORMIN HCL 500 MG PO TABS: 500.0000 mg | ORAL_TABLET | Freq: Two times a day (BID) | ORAL | Status: DC

## 2015-03-21 NOTE — Addendum Note (Signed)
Addended by: Everitt AmberLYNCH, TARA L on: 03/21/2015 08:33 AM   Modules accepted: Orders

## 2015-03-23 ENCOUNTER — Ambulatory Visit: Payer: Medicare Other | Admitting: Urology

## 2015-03-26 ENCOUNTER — Ambulatory Visit (INDEPENDENT_AMBULATORY_CARE_PROVIDER_SITE_OTHER): Payer: Medicare Other | Admitting: Urology

## 2015-03-26 ENCOUNTER — Encounter: Payer: Self-pay | Admitting: Urology

## 2015-03-26 VITALS — BP 156/81 | HR 60 | Ht 70.0 in | Wt 190.1 lb

## 2015-03-26 DIAGNOSIS — Z87898 Personal history of other specified conditions: Secondary | ICD-10-CM

## 2015-03-26 DIAGNOSIS — R310 Gross hematuria: Secondary | ICD-10-CM | POA: Diagnosis not present

## 2015-03-26 DIAGNOSIS — R3129 Other microscopic hematuria: Secondary | ICD-10-CM | POA: Diagnosis not present

## 2015-03-26 DIAGNOSIS — N402 Nodular prostate without lower urinary tract symptoms: Secondary | ICD-10-CM | POA: Diagnosis not present

## 2015-03-26 LAB — MICROSCOPIC EXAMINATION
Bacteria, UA: NONE SEEN
Epithelial Cells (non renal): NONE SEEN /hpf (ref 0–10)

## 2015-03-26 NOTE — Progress Notes (Signed)
03/26/2015 3:27 PM   Kenneth Mitchell 1933-11-08 [id number]  Referring provider: Duanne Limerick, MD [address] [place], [place] [address]  Chief Complaint  Patient presents with  . Hematuria    referred by Dr. Yetta Barre (gross hematuria)    HPI: Patient is a 81 year old white male who had an episode of gross hematuria 2 weeks ago who is referred by his primary care physician, Dr. Yetta Barre, for further evaluation and management.  Patient states about 2 weeks ago he went outside to urinate and noticed a purple hue to his urine. He states it was uncomfortable to void during the discoloration of his urine.  As the day progressed, the urine cleared to a yellow.  The uncomfortableness during voiding also abated.  He did not experience suprapubic pain, back pain, abdominal pain or flank pain.  He does not have a prior history of nephrolithiasis, UTI's or GU malignancies.    Patient was last seen here in February for elevated PSA of 7.2 ng/mL with a nodule and a biopsy was recommended at that time, but the patient needed cardiac clearance.  The clearance was obtained, but we are unable to contact the patient to schedule the biopsy.      PMH: Past Medical History  Diagnosis Date  . CAD (coronary artery disease)   . Hypertension   . Hyperlipidemia   . Diabetes mellitus (HCC)   . Gross hematuria   . Syncope   . Inguinal hernia   . Nodular prostate     Surgical History: Past Surgical History  Procedure Laterality Date  . Quadrupal bypass    . Aneurysm surgery      x 2    Home Medications:    Medication List       This list is accurate as of: 03/26/15  3:27 PM.  Always use your most recent med list.               amLODipine 10 MG tablet  Commonly known as:  NORVASC  Take 1 tablet (10 mg total) by mouth daily at 6 (six) AM.     aspirin 81 MG tablet  Take 81 mg by mouth daily.     clopidogrel 75 MG tablet  Commonly known as:  PLAVIX  Take 1 tablet (75 mg total) by  mouth daily at 6 (six) AM.     hydrochlorothiazide 25 MG tablet  Commonly known as:  HYDRODIURIL  Take 1 tablet (25 mg total) by mouth daily at 6 (six) AM.     isosorbide mononitrate 30 MG 24 hr tablet  Commonly known as:  IMDUR  Take 1 tablet (30 mg total) by mouth daily at 6 (six) AM.     lovastatin 20 MG tablet  Commonly known as:  MEVACOR  Take 1 tablet (20 mg total) by mouth daily at 6 (six) AM.     metFORMIN 500 MG tablet  Commonly known as:  GLUCOPHAGE  Take 1 tablet (500 mg total) by mouth 2 (two) times daily with a meal.     metoprolol succinate 25 MG 24 hr tablet  Commonly known as:  TOPROL-XL  Take 1 tablet (25 mg total) by mouth daily at 6 (six) AM.     sulfamethoxazole-trimethoprim 800-160 MG tablet  Commonly known as:  BACTRIM DS,SEPTRA DS  Take 1 tablet by mouth 2 (two) times daily.        Allergies: No Known Allergies  Family History: Family History  Problem Relation Age  of Onset  . Diabetes Mother   . Heart disease Father   . Stroke Father   . Kidney disease Neg Hx   . Prostate cancer Neg Hx   . Cancer Brother     Social History:  reports that he has quit smoking. He does not have any smokeless tobacco history on file. He reports that he does not drink alcohol or use illicit drugs.  ROS: UROLOGY Frequent Urination?: No Hard to postpone urination?: No Burning/pain with urination?: No Get up at night to urinate?: No Leakage of urine?: No Urine stream starts and stops?: No Trouble starting stream?: No Do you have to strain to urinate?: No Blood in urine?: No Urinary tract infection?: No Sexually transmitted disease?: No Injury to kidneys or bladder?: No Painful intercourse?: No Weak stream?: No Erection problems?: No Penile pain?: No  Gastrointestinal Nausea?: No Vomiting?: No Indigestion/heartburn?: No Diarrhea?: No Constipation?: No  Constitutional Fever: No Night sweats?: No Weight loss?: No Fatigue?: No  Skin Skin  rash/lesions?: No Itching?: No  Eyes Blurred vision?: No Double vision?: No  Ears/Nose/Throat Sore throat?: No Sinus problems?: No  Hematologic/Lymphatic Swollen glands?: No Easy bruising?: No  Cardiovascular Leg swelling?: No Chest pain?: No  Respiratory Cough?: No Shortness of breath?: No  Endocrine Excessive thirst?: No  Musculoskeletal Back pain?: No Joint pain?: No  Neurological Headaches?: No Dizziness?: No  Psychologic Depression?: No Anxiety?: No  Physical Exam: BP 156/81 mmHg  Pulse 60  Ht  (1.778 m)  Wt 190 lb 1.6 oz (86.229 kg)  BMI 27.28 kg/m2  Constitutional: Well nourished. Alert and oriented, No acute distress. HEENT:  AT, moist mucus membranes. Trachea midline, no masses. Cardiovascular: No clubbing, cyanosis, or edema. Respiratory: Normal respiratory effort, no increased work of breathing. GI: Abdomen is soft, non tender, non distended, no abdominal masses. Liver and spleen not palpable.  No hernias appreciated.  Stool sample for occult testing is not indicated.   GU: No CVA tenderness.  No bladder fullness or masses.  Patient with circumcised phallus.   Urethral meatus is patent.  No penile discharge. No penile lesions or rashes. Scrotum without lesions, cysts, rashes and/or edema.  Testicles are located scrotally bilaterally. No masses are appreciated in the testicles. Left and right epididymis are normal. Rectal: Patient with  normal sphincter tone. Anus and perineum without scarring or rashes. No rectal masses are appreciated. Prostate is approximately 60 grams, firm area in the left lobe is appreciated. Seminal vesicles are normal. Skin: No rashes, bruises or suspicious lesions. Lymph: No cervical or inguinal adenopathy. Neurologic: Grossly intact, no focal deficits, moving all 4 extremities. Psychiatric: Normal mood and affect.  Laboratory Data: Lab Results  Component Value Date   WBC 6.5 03/11/2012   HGB 14.6 03/11/2012    HCT 41.2 03/11/2012   MCV 88 03/11/2012   PLT 140* 03/11/2012    Lab Results  Component Value Date   CREATININE 1.51* 11/29/2014    Lab Results  Component Value Date   HGBA1C 11.2* 03/19/2015    Urinalysis Results for orders placed or performed in visit on 03/26/15  CULTURE, URINE COMPREHENSIVE  Result Value Ref Range   Urine Culture, Comprehensive Final report (A)    Result 1 Comment (A)    ANTIMICROBIAL SUSCEPTIBILITY Comment   Microscopic Examination  Result Value Ref Range   WBC, UA 0-5 0 -  5 /hpf   RBC, UA 0-2 0 -  2 /hpf   Epithelial Cells (non renal) None seen 0 -  10 /hpf   Mucus, UA Present (A) Not Estab.   Bacteria, UA None seen None seen/Few  Urinalysis, Complete  Result Value Ref Range   pH, UA 6.0 5.0 - 7.5     Assessment & Plan:    1. Gross hematuria:    Explained to patient the causes of blood in the urine are as follows: stones, BPH, UTI's, damage to the urinary tract and/or cancer.  It is explained to the patient that they will be scheduled for a CT Urogram with contrast material and that in rare instances, an allergic reaction can be serious and even life threatening with the injection of contrast material.   The patient denies any allergies to contrast, iodine and/or seafood and he is  taking metformin.   I have explained to the patient that they will  be scheduled for a cystoscopy in our office to evaluate their bladder.  The cystoscopy consists of passing a tube with a lens up through their urethra and into their urinary bladder.   We will inject the urethra with a lidocaine gel prior to introducing the cystoscope to help with any discomfort during the procedure.   After the procedure, they might experience blood in the urine and discomfort with urination.  This will abate after the first few voids.  I have  encouraged the patient to increase water intake  during this time.  Patient denies any allergies to lidocaine.   - Urinalysis, Complete - CULTURE,  URINE COMPREHENSIVE  2. History of elevated PSA:   Patient was found to have an elevated PSA of 7.2 ng/mL on 05/06/2014.  He also had a nodular left base on exam.  He was scheduled for a biopsy at that time, but we were unable to contact the patient to schedule the procedure.    3. Nodular prostate:   Patient was found to have a nodular area in the left lobe of his prostate.  I did not obtain a PSA today because of the gross hematuria.  I have sent the urine for culture.  Pending the results of the CT Urogram and cystoscopy, we will decide on what course to take concerning his history of elevated PSA and the nodular prostate.     Return for CT Urogram report and cystoscopy.  Michiel CowboySHANNON Yarisbel Miranda, PA-C  [facility] [address], [address] [place], [place] [address] [phone]

## 2015-03-27 LAB — URINALYSIS, COMPLETE: PH UA: 6 (ref 5.0–7.5)

## 2015-03-28 ENCOUNTER — Telehealth: Payer: Self-pay | Admitting: Urology

## 2015-03-28 NOTE — Telephone Encounter (Signed)
Pt LM on voice mail, asked if Carollee HerterShannon could call him back at [phone].  Pt did not state what he needed.

## 2015-03-28 NOTE — Telephone Encounter (Signed)
No vm set up. 

## 2015-03-29 LAB — CULTURE, URINE COMPREHENSIVE

## 2015-03-29 NOTE — Telephone Encounter (Signed)
Spoke with pt who stated he was wanting to know the purpose of the CT scan next week. Nurse reinforced the hematuria work up with pt. Pt voiced understanding.

## 2015-03-30 ENCOUNTER — Telehealth: Payer: Self-pay

## 2015-03-30 DIAGNOSIS — E1165 Type 2 diabetes mellitus with hyperglycemia: Secondary | ICD-10-CM | POA: Diagnosis not present

## 2015-03-30 NOTE — Telephone Encounter (Signed)
-----   Message from Harle BattiestShannon A McGowan, PA-C sent at 03/29/2015 12:02 PM EST ----- Patient does not have an UTI.  This is a skin contaminate.  He still needs his CT Urogram and cystoscopy.

## 2015-03-30 NOTE — Telephone Encounter (Signed)
No vm set up. 

## 2015-04-02 NOTE — Telephone Encounter (Signed)
Spoke with pt in reference to UTI, CT, and cysto. Pt voiced understanding.

## 2015-04-04 ENCOUNTER — Ambulatory Visit
Admission: RE | Admit: 2015-04-04 | Discharge: 2015-04-04 | Disposition: A | Discharge status: Home / Self Care | Payer: Medicare Other | Source: Ambulatory Visit | Attending: Urology | Admitting: Urology

## 2015-04-04 ENCOUNTER — Telehealth: Payer: Self-pay

## 2015-04-04 DIAGNOSIS — N400 Enlarged prostate without lower urinary tract symptoms: Secondary | ICD-10-CM | POA: Insufficient documentation

## 2015-04-04 DIAGNOSIS — I714 Abdominal aortic aneurysm, without rupture: Secondary | ICD-10-CM | POA: Insufficient documentation

## 2015-04-04 DIAGNOSIS — I700 Atherosclerosis of aorta: Secondary | ICD-10-CM | POA: Insufficient documentation

## 2015-04-04 DIAGNOSIS — R310 Gross hematuria: Secondary | ICD-10-CM

## 2015-04-04 DIAGNOSIS — K760 Fatty (change of) liver, not elsewhere classified: Secondary | ICD-10-CM | POA: Diagnosis not present

## 2015-04-04 MED ORDER — IOHEXOL 300 MG/ML  SOLN
125.0000 mL | Freq: Once | INTRAMUSCULAR | Status: AC | PRN
  Administered 2015-04-04: 125 mL via INTRAVENOUS

## 2015-04-04 NOTE — Telephone Encounter (Signed)
Spoke with pt in reference to vascular surgeon. Pt stated that he is not seeing a vascular surgeon and does not plan to.

## 2015-04-04 NOTE — Telephone Encounter (Signed)
-----   Message from Harle BattiestShannon A McGowan, PA-C sent at 04/04/2015 10:25 AM EST ----- Does the patient have a vascular surgeon that is following his aneurysms?  If so, we need to send this report to them for their records.

## 2015-04-09 ENCOUNTER — Other Ambulatory Visit: Payer: Self-pay

## 2015-04-09 DIAGNOSIS — E119 Type 2 diabetes mellitus without complications: Secondary | ICD-10-CM

## 2015-04-09 MED ORDER — METFORMIN HCL 500 MG PO TABS: 500.0000 mg | ORAL_TABLET | Freq: Two times a day (BID) | ORAL | Status: DC

## 2015-04-11 ENCOUNTER — Other Ambulatory Visit: Payer: Medicare Other | Admitting: Urology

## 2015-04-17 ENCOUNTER — Ambulatory Visit: Payer: Medicare Other | Admitting: Family Medicine

## 2015-04-18 ENCOUNTER — Other Ambulatory Visit: Payer: Medicare Other | Admitting: Urology

## 2015-04-27 DIAGNOSIS — I10 Essential (primary) hypertension: Secondary | ICD-10-CM | POA: Diagnosis not present

## 2015-04-27 DIAGNOSIS — Z794 Long term (current) use of insulin: Secondary | ICD-10-CM | POA: Diagnosis not present

## 2015-04-27 DIAGNOSIS — E1165 Type 2 diabetes mellitus with hyperglycemia: Secondary | ICD-10-CM | POA: Diagnosis not present

## 2015-05-04 ENCOUNTER — Telehealth: Payer: Self-pay

## 2015-05-04 NOTE — Telephone Encounter (Signed)
-----   Message from Harle BattiestShannon A McGowan, PA-C sent at 05/03/2015 10:26 PM EST ----- Patient never had his cystoscopy.  He needs to reschedule.

## 2015-05-07 NOTE — Telephone Encounter (Signed)
I called to reschedule and the patient does not want to have the cysto done. He said everything was normal and he did not want to come back in.  Kenneth Mitchell

## 2015-05-07 NOTE — Telephone Encounter (Signed)
We will have to send the patient a certified letter informing him of the possible sequelae of blood in the urine.

## 2015-05-08 NOTE — Telephone Encounter (Signed)
Letter sent.

## 2015-05-14 ENCOUNTER — Other Ambulatory Visit: Payer: Self-pay

## 2015-05-25 DIAGNOSIS — E1165 Type 2 diabetes mellitus with hyperglycemia: Secondary | ICD-10-CM | POA: Diagnosis not present

## 2015-05-25 DIAGNOSIS — E781 Pure hyperglyceridemia: Secondary | ICD-10-CM | POA: Diagnosis not present

## 2015-05-25 DIAGNOSIS — Z794 Long term (current) use of insulin: Secondary | ICD-10-CM | POA: Diagnosis not present

## 2015-05-25 DIAGNOSIS — I10 Essential (primary) hypertension: Secondary | ICD-10-CM | POA: Diagnosis not present

## 2015-06-14 ENCOUNTER — Other Ambulatory Visit: Payer: Self-pay

## 2015-06-14 DIAGNOSIS — I10 Essential (primary) hypertension: Secondary | ICD-10-CM

## 2015-06-14 DIAGNOSIS — I2510 Atherosclerotic heart disease of native coronary artery without angina pectoris: Secondary | ICD-10-CM

## 2015-06-14 DIAGNOSIS — E785 Hyperlipidemia, unspecified: Secondary | ICD-10-CM

## 2015-06-14 DIAGNOSIS — E119 Type 2 diabetes mellitus without complications: Secondary | ICD-10-CM

## 2015-06-14 MED ORDER — HYDROCHLOROTHIAZIDE 25 MG PO TABS: 25.0000 mg | ORAL_TABLET | Freq: Every day | ORAL | Status: DC

## 2015-06-14 MED ORDER — LOVASTATIN 20 MG PO TABS: 20.0000 mg | ORAL_TABLET | Freq: Every day | ORAL | Status: DC

## 2015-06-14 MED ORDER — METOPROLOL SUCCINATE ER 25 MG PO TB24: 25.0000 mg | ORAL_TABLET | Freq: Every day | ORAL | Status: DC

## 2015-06-14 MED ORDER — AMLODIPINE BESYLATE 10 MG PO TABS: 10.0000 mg | ORAL_TABLET | Freq: Every day | ORAL | Status: DC

## 2015-06-14 MED ORDER — CLOPIDOGREL BISULFATE 75 MG PO TABS
75.0000 mg | ORAL_TABLET | Freq: Every day | ORAL | Status: DC
Start: 2015-06-14 — End: 2016-01-11

## 2015-06-14 MED ORDER — ISOSORBIDE MONONITRATE ER 30 MG PO TB24: 30.0000 mg | ORAL_TABLET | Freq: Every day | ORAL | Status: DC

## 2015-08-13 DIAGNOSIS — I10 Essential (primary) hypertension: Secondary | ICD-10-CM | POA: Diagnosis not present

## 2015-08-13 DIAGNOSIS — Z794 Long term (current) use of insulin: Secondary | ICD-10-CM | POA: Diagnosis not present

## 2015-08-13 DIAGNOSIS — E1165 Type 2 diabetes mellitus with hyperglycemia: Secondary | ICD-10-CM | POA: Diagnosis not present

## 2015-08-17 DIAGNOSIS — E119 Type 2 diabetes mellitus without complications: Secondary | ICD-10-CM | POA: Diagnosis not present

## 2015-10-12 ENCOUNTER — Other Ambulatory Visit: Payer: Self-pay | Admitting: Family Medicine

## 2015-11-03 ENCOUNTER — Emergency Department
Admission: EM | Admit: 2015-11-03 | Discharge: 2015-11-03 | Disposition: A | Discharge status: Home / Self Care | Payer: Medicare Other | Attending: Emergency Medicine | Admitting: Emergency Medicine

## 2015-11-03 DIAGNOSIS — Z8679 Personal history of other diseases of the circulatory system: Secondary | ICD-10-CM | POA: Insufficient documentation

## 2015-11-03 DIAGNOSIS — Z792 Long term (current) use of antibiotics: Secondary | ICD-10-CM | POA: Diagnosis not present

## 2015-11-03 DIAGNOSIS — Z79899 Other long term (current) drug therapy: Secondary | ICD-10-CM | POA: Diagnosis not present

## 2015-11-03 DIAGNOSIS — I10 Essential (primary) hypertension: Secondary | ICD-10-CM | POA: Diagnosis not present

## 2015-11-03 DIAGNOSIS — Z87891 Personal history of nicotine dependence: Secondary | ICD-10-CM | POA: Diagnosis not present

## 2015-11-03 DIAGNOSIS — Z7982 Long term (current) use of aspirin: Secondary | ICD-10-CM | POA: Insufficient documentation

## 2015-11-03 DIAGNOSIS — B029 Zoster without complications: Secondary | ICD-10-CM | POA: Diagnosis not present

## 2015-11-03 DIAGNOSIS — E119 Type 2 diabetes mellitus without complications: Secondary | ICD-10-CM | POA: Insufficient documentation

## 2015-11-03 DIAGNOSIS — I2510 Atherosclerotic heart disease of native coronary artery without angina pectoris: Secondary | ICD-10-CM | POA: Insufficient documentation

## 2015-11-03 DIAGNOSIS — L539 Erythematous condition, unspecified: Secondary | ICD-10-CM | POA: Diagnosis present

## 2015-11-03 DIAGNOSIS — Z7984 Long term (current) use of oral hypoglycemic drugs: Secondary | ICD-10-CM | POA: Diagnosis not present

## 2015-11-03 DIAGNOSIS — E785 Hyperlipidemia, unspecified: Secondary | ICD-10-CM | POA: Insufficient documentation

## 2015-11-03 MED ORDER — VALACYCLOVIR HCL 1 G PO TABS: 1000.0000 mg | ORAL_TABLET | Freq: Three times a day (TID) | ORAL | Status: AC

## 2015-11-03 MED ORDER — VALACYCLOVIR HCL 500 MG PO TABS
1000.0000 mg | ORAL_TABLET | Freq: Once | ORAL | Status: AC
  Administered 2015-11-03: 1000 mg via ORAL
  Filled 2015-11-03: qty 2

## 2015-11-03 MED ORDER — OXYCODONE-ACETAMINOPHEN 5-325 MG PO TABS: 1.0000 | ORAL_TABLET | Freq: Four times a day (QID) | ORAL | Status: DC | PRN

## 2015-11-03 NOTE — Discharge Instructions (Signed)
Shingles °Shingles, which is also known as herpes zoster, is an infection that causes a painful skin rash and fluid-filled blisters. Shingles is not related to genital herpes, which is a sexually transmitted infection.  ° °Shingles only develops in people who: °· Have had chickenpox. °· Have received the chickenpox vaccine. (This is rare.) °CAUSES °Shingles is caused by varicella-zoster virus (VZV). This is the same virus that causes chickenpox. After exposure to VZV, the virus stays in the body in an inactive (dormant) state. Shingles develops if the virus reactivates. This can happen many years after the initial exposure to VZV. It is not known what causes this virus to reactivate. °RISK FACTORS °People who have had chickenpox or received the chickenpox vaccine are at risk for shingles. Infection is more common in people who: °· Are older than age 50. °· Have a weakened defense (immune) system, such as those with HIV, AIDS, or cancer. °· Are taking medicines that weaken the immune system, such as transplant medicines. °· Are under great stress. °SYMPTOMS °Early symptoms of this condition include itching, tingling, and pain in an area on your skin. Pain may be described as burning, stabbing, or throbbing. °A few days or weeks after symptoms start, a painful red rash appears, usually on one side of the body in a bandlike or beltlike pattern. The rash eventually turns into fluid-filled blisters that break open, scab over, and dry up in about 2-3 weeks. °At any time during the infection, you may also develop: °· A fever. °· Chills. °· A headache. °· An upset stomach. °DIAGNOSIS °This condition is diagnosed with a skin exam. Sometimes, skin or fluid samples are taken from the blisters before a diagnosis is made. These samples are examined under a microscope or sent to a lab for testing. °TREATMENT °There is no specific cure for this condition. Your health care provider will probably prescribe medicines to help you  manage pain, recover more quickly, and avoid long-term problems. Medicines may include: °· Antiviral drugs. °· Anti-inflammatory drugs. °· Pain medicines. °If the area involved is on your face, you may be referred to a specialist, such as an eye doctor (ophthalmologist) or an ear, nose, and throat (ENT) doctor to help you avoid eye problems, chronic pain, or disability. °HOME CARE INSTRUCTIONS °Medicines °· Take medicines only as directed by your health care provider. °· Apply an anti-itch or numbing cream to the affected area as directed by your health care provider. °Blister and Rash Care °· Take a cool bath or apply cool compresses to the area of the rash or blisters as directed by your health care provider. This may help with pain and itching. °· Keep your rash covered with a loose bandage (dressing). Wear loose-fitting clothing to help ease the pain of material rubbing against the rash. °· Keep your rash and blisters clean with mild soap and cool water or as directed by your health care provider. °· Check your rash every day for signs of infection. These include redness, swelling, and pain that lasts or increases. °· Do not pick your blisters. °· Do not scratch your rash. °General Instructions °· Rest as directed by your health care provider. °· Keep all follow-up visits as directed by your health care provider. This is important. °· Until your blisters scab over, your infection can cause chickenpox in people who have never had it or been vaccinated against it. To prevent this from happening, avoid contact with other people, especially: °· Babies. °· Pregnant women. °· Children   who have eczema. °· Elderly people who have transplants. °· People who have chronic illnesses, such as leukemia or AIDS. °SEEK MEDICAL CARE IF: °· Your pain is not relieved with prescribed medicines. °· Your pain does not get better after the rash heals. °· Your rash looks infected. Signs of infection include redness, swelling, and pain  that lasts or increases. °SEEK IMMEDIATE MEDICAL CARE IF: °· The rash is on your face or nose. °· You have facial pain, pain around your eye area, or loss of feeling on one side of your face. °· You have ear pain or you have ringing in your ear. °· You have loss of taste. °· Your condition gets worse. °  °This information is not intended to replace advice given to you by your health care provider. Make sure you discuss any questions you have with your health care provider. °  °Document Released: 04/07/2005 Document Revised: 04/28/2014 Document Reviewed: 02/16/2014 °Elsevier Interactive Patient Education ©2016 [employer] ° °Neuropathic Pain °Neuropathic pain is pain caused by damage to the nerves that are responsible for certain sensations in your body (sensory nerves). The pain can be caused by damage to:  °· The sensory nerves that send signals to your spinal cord and brain (peripheral nervous system). °· The sensory nerves in your brain or spinal cord (central nervous system). °Neuropathic pain can make you more sensitive to pain. What would be a minor sensation for most people may feel very painful if you have neuropathic pain. This is usually a long-term condition that can be difficult to treat. The type of pain can differ from person to person. It may start suddenly (acute), or it may develop slowly and last for a long time (chronic). Neuropathic pain may come and go as damaged nerves heal or may stay at the same level for years. It often causes emotional distress, loss of sleep, and a lower quality of life. °CAUSES  °The most common cause of damage to a sensory nerve is diabetes. Many other diseases and conditions can also cause neuropathic pain. Causes of neuropathic pain can be classified as: °· Toxic. Many drugs and chemicals can cause toxic damage. The most common cause of toxic neuropathic pain is damage from drug treatment for cancer (chemotherapy). °· Metabolic. This type of pain can happen when a  disease causes imbalances that damage nerves. Diabetes is the most common of these diseases. Vitamin B deficiency caused by long-term alcohol abuse is another common cause. °· Traumatic. Any injury that cuts, crushes, or stretches a nerve can cause damage and pain. A common example is feeling pain after losing an arm or leg (phantom limb pain). °· Compression-related. If a sensory nerve gets trapped or compressed for a long period of time, the blood supply to the nerve can be cut off. °· Vascular. Many blood vessel diseases can cause neuropathic pain by decreasing blood supply and oxygen to nerves. °· Autoimmune. This type of pain results from diseases in which the body's defense system mistakenly attacks sensory nerves. Examples of autoimmune diseases that can cause neuropathic pain include lupus and multiple sclerosis. °· Infectious. Many types of viral infections can damage sensory nerves and cause pain. Shingles infection is a common cause of this type of pain. °· Inherited. Neuropathic pain can be a symptom of many diseases that are passed down through families (genetic). °SIGNS AND SYMPTOMS  °The main symptom is pain. Neuropathic pain is often described as: °· Burning. °· Shock-like. °· Stinging. °· Hot or cold. °·   Itching. °DIAGNOSIS  °No single test can diagnose neuropathic pain. Your health care provider will do a physical exam and ask you about your pain. You may use a pain scale to describe how bad your pain is. You may also have tests to see if you have a high sensitivity to pain and to help find the cause and location of any sensory nerve damage. These tests may include: °· Imaging studies, such as: °¨ X-rays. °¨ CT scan. °¨ MRI. °· Nerve conduction studies to test how well nerve signals travel through your sensory nerves (electrodiagnostic testing). °· Stimulating your sensory nerves through electrodes on your skin and measuring the response in your spinal cord and brain (somatosensory evoked  potentials). °TREATMENT  °Treatment for neuropathic pain may change over time. You may need to try different treatment options or a combination of treatments. Some options include: °· Over-the-counter pain relievers. °· Prescription medicines. Some medicines used to treat other conditions may also help neuropathic pain. These include medicines to: °¨ Control seizures (anticonvulsants). °¨ Relieve depression (antidepressants). °· Prescription-strength pain relievers (narcotics). These are usually used when other pain relievers do not help. °· Transcutaneous nerve stimulation (TENS). This uses electrical currents to block painful nerve signals. The treatment is painless. °· Topical and local anesthetics. These are medicines that numb the nerves. They can be injected as a nerve block or applied to the skin. °· Alternative treatments, such as: °¨ Acupuncture. °¨ Meditation. °¨ Massage. °¨ Physical therapy. °¨ Pain management programs. °¨ Counseling. °HOME CARE INSTRUCTIONS °· Learn as much as you can about your condition. °· Take medicines only as directed by your health care provider. °· Work closely with all your health care providers to find what works best for you. °· Have a good support system at home. °· Consider joining a chronic pain support group. °SEEK MEDICAL CARE IF: °· Your pain treatments are not helping. °· You are having side effects from your medicines. °· You are struggling with fatigue, mood changes, depression, or anxiety. °  °This information is not intended to replace advice given to you by your health care provider. Make sure you discuss any questions you have with your health care provider. °  °Document Released: 01/03/2004 Document Revised: 04/28/2014 Document Reviewed: 09/15/2013 °Elsevier Interactive Patient Education ©2016 [employer] ° °

## 2015-11-03 NOTE — ED Notes (Addendum)
Pt c/o rash to upper left buttock and groin area x 2-3 days; both areas itching and sore; denies urinary s/s; pt concerned he has shingles

## 2015-11-03 NOTE — ED Provider Notes (Signed)
[facility] Emergency Department Provider Note  ____________________________________________  Time seen: Approximately 11:07 PM  I have reviewed the triage vital signs and the nursing notes.   HISTORY  Chief Complaint Rash    HPI Kenneth Mitchell is a 82 y.o. male who presents to emergency department complaining of a rash spreading from service back along his left upper buttocks into the left groin area. Patient reports that area has been there for 2 days. He states first he thought he had chigger bites to his back as he had been outside but he states that the rash has continued to spread into his groin area in a dermatomal distribution. Area in his groin is turning out to be very painful. He denies any other complaints at this time. No fevers or chills, dull pain, nausea or vomiting. Patient states that areas start out as small blisters in turn into larger lesions.   Past Medical History  Diagnosis Date  . CAD (coronary artery disease)   . Hypertension   . Hyperlipidemia   . Diabetes mellitus (HCC)   . Gross hematuria   . Syncope   . Inguinal hernia   . Nodular prostate     Patient Active Problem List   Diagnosis Date Noted  . Gross hematuria 03/26/2015  . History of elevated PSA 03/26/2015  . Nodular prostate 03/26/2015  . Insect bite 12/08/2014  . Arteriosclerosis of coronary artery 11/29/2014  . AAA (abdominal aortic aneurysm) (HCC) 10/04/2013  . Carotid artery narrowing 10/04/2013  . Dyslipidemia 10/04/2013  . BP (high blood pressure) 10/04/2013  . Excessive urination at night 10/04/2013    Past Surgical History  Procedure Laterality Date  . Quadrupal bypass    . Aneurysm surgery      x 2    Current Outpatient Rx  Name  Route  Sig  Dispense  Refill  . amLODipine (NORVASC) 10 MG tablet   Oral   Take 1 tablet (10 mg total) by mouth daily at 6 (six) AM.   90 tablet   0   . aspirin 81 MG tablet   Oral   Take 81 mg by mouth daily.          . clopidogrel (PLAVIX) 75 MG tablet   Oral   Take 1 tablet (75 mg total) by mouth daily at 6 (six) AM.   90 tablet   0   . hydrochlorothiazide (HYDRODIURIL) 25 MG tablet   Oral   Take 1 tablet (25 mg total) by mouth daily at 6 (six) AM.   90 tablet   0   . isosorbide mononitrate (IMDUR) 30 MG 24 hr tablet   Oral   Take 1 tablet (30 mg total) by mouth daily at 6 (six) AM.   90 tablet   0   . lovastatin (MEVACOR) 20 MG tablet   Oral   Take 1 tablet (20 mg total) by mouth daily at 6 (six) AM.   90 tablet   0   . metFORMIN (GLUCOPHAGE) 500 MG tablet   Oral   Take 1 tablet (500 mg total) by mouth 2 (two) times daily with a meal.   60 tablet   0   . metoprolol succinate (TOPROL-XL) 25 MG 24 hr tablet   Oral   Take 1 tablet (25 mg total) by mouth daily at 6 (six) AM.   90 tablet   0   . nitroGLYCERIN (NITRODUR - DOSED IN MG/24 HR) 0.2 mg/hr patch  UNWRAP AND APPLY 1 PATCH ONCE DAILY, LEAVE ON FOR 12-14 HOURS(WAIT 10-12 HOURS BEFORE NEXT PATCH)   90 patch   0     **Patient requests 90 days supply**   . oxyCODONE-acetaminophen (ROXICET) 5-325 MG tablet   Oral   Take 1 tablet by mouth every 6 (six) hours as needed for severe pain.   20 tablet   0   . sulfamethoxazole-trimethoprim (BACTRIM DS,SEPTRA DS) 800-160 MG tablet   Oral   Take 1 tablet by mouth 2 (two) times daily.   20 tablet   0   . valACYclovir (VALTREX) 1000 MG tablet   Oral   Take 1 tablet (1,000 mg total) by mouth 3 (three) times daily.   21 tablet   0     Allergies Review of patient's allergies indicates no known allergies.  Family History  Problem Relation Age of Onset  . Diabetes Mother   . Heart disease Father   . Stroke Father   . Kidney disease Neg Hx   . Prostate cancer Neg Hx   . Cancer Brother     Social History Social History  Substance Use Topics  . Smoking status: Former Games developer  . Smokeless tobacco: Not on file     Comment: quit 20 years  . Alcohol Use: No      Review of Systems  Constitutional: No fever/chills Cardiovascular: no chest pain. Respiratory: no cough. No SOB. Musculoskeletal: Negative for musculoskeletal pain. Skin: Positive for erythematous vesicular lesions in dermatomal distribution along the left buttocks into the left groin. Neurological: Negative for headaches, focal weakness or numbness. 10-point ROS otherwise negative.  ____________________________________________   PHYSICAL EXAM:  VITAL SIGNS: ED Triage Vitals  Enc Vitals Group     BP 11/03/15 2239 199/81 mmHg     Pulse Rate 11/03/15 2239 83     Resp 11/03/15 2239 18     Temp 11/03/15 2239 97.7 F (36.5 C)     Temp Source 11/03/15 2239 Oral     SpO2 11/03/15 2239 100 %     Weight 11/03/15 2239 195 lb (88.451 kg)     Height 11/03/15 2239  (1.778 m)     Head Cir --      Peak Flow --      Pain Score 11/03/15 2239 8     Pain Loc --      Pain Edu? --      Excl. in GC? --      Constitutional: Alert and oriented. Well appearing and in no acute distress. Eyes: Conjunctivae are normal. PERRL. EOMI. Head: Atraumatic. Cardiovascular: Normal rate, regular rhythm. Normal S1 and S2.  Good peripheral circulation. Respiratory: Normal respiratory effort without tachypnea or retractions. Lungs CTAB. Good air entry to the bases with no decreased or absent breath sounds. Musculoskeletal: Full range of motion to all extremities. No gross deformities appreciated. Neurologic:  Normal speech and language. No gross focal neurologic deficits are appreciated.  Skin:  Skin is warm, dry and intact. No rash noted. Patient has a vesicular rash in dermatomal distribution from the left buttocks into the left groin. Area is mildly tender to palpation. No drainage at this time. Psychiatric: Mood and affect are normal. Speech and behavior are normal. Patient exhibits appropriate insight and judgement.   ____________________________________________   LABS (all labs ordered are  listed, but only abnormal results are displayed)  Labs Reviewed - No data to display ____________________________________________  EKG   ____________________________________________  RADIOLOGY   No results  found.  ____________________________________________    PROCEDURES  Procedure(s) performed:       Medications  valACYclovir (VALTREX) tablet 1,000 mg (not administered)     ____________________________________________   INITIAL IMPRESSION / ASSESSMENT AND PLAN / ED COURSE  Pertinent labs & imaging results that were available during my care of the patient were reviewed by me and considered in my medical decision making (see chart for details).  Patient's diagnosis is consistent with shingles. Patient is given instructions on keeping area clean and covered to avoid spread of shingles. Patient will be discharged home with prescriptions for antiviral and pain medication. Patient is to follow up with her care provider as needed or otherwise directed. Patient is given ED precautions to return to the ED for any worsening or new symptoms.     ____________________________________________  FINAL CLINICAL IMPRESSION(S) / ED DIAGNOSES  Final diagnoses:  Shingles      NEW MEDICATIONS STARTED DURING THIS VISIT:  New Prescriptions   OXYCODONE-ACETAMINOPHEN (ROXICET) 5-325 MG TABLET    Take 1 tablet by mouth every 6 (six) hours as needed for severe pain.   VALACYCLOVIR (VALTREX) 1000 MG TABLET    Take 1 tablet (1,000 mg total) by mouth 3 (three) times daily.        This chart was dictated using voice recognition software/Dragon. Despite best efforts to proofread, errors can occur which can change the meaning. Any change was purely unintentional.    Racheal PatchesJonathan D Kolbe Delmonaco, PA-C 11/03/15 2313  Myrna Blazeravid Matthew Schaevitz, MD 11/04/15 [phone]

## 2015-11-05 ENCOUNTER — Telehealth: Payer: Self-pay

## 2015-11-05 NOTE — Telephone Encounter (Signed)
Patient called he states that he has contracted Shingles, and would like to discuss this with you.

## 2015-11-09 ENCOUNTER — Telehealth: Payer: Self-pay

## 2015-11-09 NOTE — Telephone Encounter (Signed)
Advised patient to finish Valcycl.  that he has and then this should start healing.

## 2015-11-16 DIAGNOSIS — E119 Type 2 diabetes mellitus without complications: Secondary | ICD-10-CM | POA: Diagnosis not present

## 2015-11-23 DIAGNOSIS — E119 Type 2 diabetes mellitus without complications: Secondary | ICD-10-CM | POA: Diagnosis not present

## 2015-12-20 ENCOUNTER — Other Ambulatory Visit: Payer: Self-pay

## 2016-01-11 ENCOUNTER — Encounter: Payer: Self-pay | Admitting: Family Medicine

## 2016-01-11 ENCOUNTER — Ambulatory Visit (INDEPENDENT_AMBULATORY_CARE_PROVIDER_SITE_OTHER): Payer: Medicare Other | Admitting: Family Medicine

## 2016-01-11 VITALS — BP 128/80 | HR 80 | Ht 70.0 in | Wt 183.0 lb

## 2016-01-11 DIAGNOSIS — I25810 Atherosclerosis of coronary artery bypass graft(s) without angina pectoris: Secondary | ICD-10-CM

## 2016-01-11 DIAGNOSIS — I10 Essential (primary) hypertension: Secondary | ICD-10-CM

## 2016-01-11 DIAGNOSIS — I2510 Atherosclerotic heart disease of native coronary artery without angina pectoris: Secondary | ICD-10-CM

## 2016-01-11 DIAGNOSIS — E785 Hyperlipidemia, unspecified: Secondary | ICD-10-CM

## 2016-01-11 MED ORDER — ISOSORBIDE MONONITRATE ER 30 MG PO TB24: 30.0000 mg | ORAL_TABLET | Freq: Every day | ORAL | 1 refills | Status: DC

## 2016-01-11 MED ORDER — LOVASTATIN 20 MG PO TABS: 20.0000 mg | ORAL_TABLET | Freq: Every day | ORAL | 1 refills | Status: DC

## 2016-01-11 MED ORDER — CLOPIDOGREL BISULFATE 75 MG PO TABS: 75.0000 mg | ORAL_TABLET | Freq: Every day | ORAL | 1 refills | Status: DC

## 2016-01-11 MED ORDER — METOPROLOL SUCCINATE ER 25 MG PO TB24: 25.0000 mg | ORAL_TABLET | Freq: Every day | ORAL | 1 refills | Status: DC

## 2016-01-11 MED ORDER — AMLODIPINE BESYLATE 10 MG PO TABS: 10.0000 mg | ORAL_TABLET | Freq: Every day | ORAL | 1 refills | Status: DC

## 2016-01-11 MED ORDER — HYDROCHLOROTHIAZIDE 25 MG PO TABS: 25.0000 mg | ORAL_TABLET | Freq: Every day | ORAL | 1 refills | Status: DC

## 2016-01-11 NOTE — Progress Notes (Signed)
Name: Kenneth Mitchell   MRN: [id number]    DOB: 07-Jan-1934   Date:01/11/2016       Progress Note  Subjective  Chief Complaint  Chief Complaint  Patient presents with  . Hyperlipidemia  . Hypertension  . Coronary Artery Disease    plavix refill    Hyperlipidemia  This is a chronic problem. The current episode started more than 1 year ago. The problem is controlled. Recent lipid tests were reviewed and are normal. He has no history of chronic renal disease, diabetes, hypothyroidism, liver disease, obesity or nephrotic syndrome. Factors aggravating his hyperlipidemia include thiazides. Associated symptoms include shortness of breath. Pertinent negatives include no chest pain, focal sensory loss, focal weakness, leg pain or myalgias. Current antihyperlipidemic treatment includes statins. The current treatment provides moderate improvement of lipids. There are no compliance problems.   Hypertension  This is a chronic problem. The current episode started more than 1 year ago. The problem is unchanged. The problem is controlled. Associated symptoms include shortness of breath. Pertinent negatives include no anxiety, blurred vision, chest pain, headaches, malaise/fatigue, neck pain, orthopnea, palpitations, peripheral edema, PND or sweats. There are no associated agents to hypertension. There are no known risk factors for coronary artery disease. Past treatments include calcium channel blockers, beta blockers and diuretics. The current treatment provides mild improvement. There are no compliance problems.  There is no history of angina, kidney disease, CAD/MI, CVA, heart failure, left ventricular hypertrophy, PVD or retinopathy. There is no history of chronic renal disease.  Coronary Artery Disease  Presents for follow-up visit. Symptoms include shortness of breath. Pertinent negatives include no chest pain, chest pressure, chest tightness, dizziness, leg swelling, muscle weakness, palpitations or weight  gain. ("no energy") Risk factors include hyperlipidemia and hypertension. Risk factors do not include obesity. The symptoms have been stable. Compliance with diet is good. Compliance with exercise is poor. Compliance with medications is good.    No problem-specific Assessment & Plan notes found for this encounter.   Past Medical History:  Diagnosis Date  . CAD (coronary artery disease)   . Diabetes mellitus (HCC)   . Gross hematuria   . Hyperlipidemia   . Hypertension   . Inguinal hernia   . Nodular prostate   . Syncope     Past Surgical History:  Procedure Laterality Date  . aneurysm surgery     x 2  . quadrupal bypass      Family History  Problem Relation Age of Onset  . Diabetes Mother   . Heart disease Father   . Stroke Father   . Cancer Brother   . Kidney disease Neg Hx   . Prostate cancer Neg Hx     Social History   Social History  . Marital status: Married    Spouse name: N/A  . Number of children: N/A  . Years of education: N/A   Occupational History  . Not on file.   Social History Main Topics  . Smoking status: Former Games developer  . Smokeless tobacco: Not on file     Comment: quit 20 years  . Alcohol use No  . Drug use: No  . Sexual activity: No   Other Topics Concern  . Not on file   Social History Narrative  . No narrative on file    No Known Allergies   Review of Systems  Constitutional: Negative for chills, fever, malaise/fatigue, weight gain and weight loss.  HENT: Negative for ear discharge, ear pain and sore throat.  Eyes: Negative for blurred vision.  Respiratory: Positive for shortness of breath. Negative for cough, sputum production, chest tightness and wheezing.   Cardiovascular: Negative for chest pain, palpitations, orthopnea, leg swelling and PND.  Gastrointestinal: Negative for abdominal pain, blood in stool, constipation, diarrhea, heartburn, melena and nausea.  Genitourinary: Negative for dysuria, frequency, hematuria and  urgency.  Musculoskeletal: Negative for back pain, joint pain, myalgias, muscle weakness and neck pain.  Skin: Negative for rash.  Neurological: Negative for dizziness, tingling, sensory change, focal weakness and headaches.  Endo/Heme/Allergies: Negative for environmental allergies and polydipsia. Does not bruise/bleed easily.  Psychiatric/Behavioral: Negative for depression and suicidal ideas. The patient is not nervous/anxious and does not have insomnia.      Objective  Vitals:   01/11/16 0900  BP: 128/80  Pulse: 80  Weight: 183 lb (83 kg)  Height: 5\' 10"  (1.778 m)    Physical Exam  Constitutional: He is oriented to person, place, and time and well-developed, well-nourished, and in no distress.  HENT:  Head: Normocephalic.  Right Ear: External ear normal.  Left Ear: External ear normal.  Nose: Nose normal.  Mouth/Throat: Oropharynx is clear and moist.  Eyes: Conjunctivae and EOM are normal. Pupils are equal, round, and reactive to light. Right eye exhibits no discharge. Left eye exhibits no discharge. No scleral icterus.  Neck: Normal range of motion. Neck supple. No JVD present. No tracheal deviation present. No thyromegaly present.  Cardiovascular: Normal rate, regular rhythm, normal heart sounds and intact distal pulses.  Exam reveals no gallop and no friction rub.   No murmur heard. Pulmonary/Chest: Breath sounds normal. No respiratory distress. He has no wheezes. He has no rales.  Abdominal: Soft. Bowel sounds are normal. He exhibits no mass. There is no hepatosplenomegaly. There is no tenderness. There is no rebound, no guarding and no CVA tenderness.  Musculoskeletal: Normal range of motion. He exhibits no edema or tenderness.  Lymphadenopathy:    He has no cervical adenopathy.  Neurological: He is alert and oriented to person, place, and time. He has normal sensation, normal strength, normal reflexes and intact cranial nerves. No cranial nerve deficit.  Skin: Skin is  warm. No rash noted.  Psychiatric: Mood and affect normal.  Nursing note and vitals reviewed.     Assessment & Plan  Problem List Items Addressed This Visit      Cardiovascular and Mediastinum   Arteriosclerosis of coronary artery   Relevant Medications   metoprolol succinate (TOPROL-XL) 25 MG 24 hr tablet   hydrochlorothiazide (HYDRODIURIL) 25 MG tablet   amLODipine (NORVASC) 10 MG tablet   lovastatin (MEVACOR) 20 MG tablet   isosorbide mononitrate (IMDUR) 30 MG 24 hr tablet   clopidogrel (PLAVIX) 75 MG tablet   BP (high blood pressure) - Primary   Relevant Medications   metoprolol succinate (TOPROL-XL) 25 MG 24 hr tablet   hydrochlorothiazide (HYDRODIURIL) 25 MG tablet   amLODipine (NORVASC) 10 MG tablet   lovastatin (MEVACOR) 20 MG tablet   isosorbide mononitrate (IMDUR) 30 MG 24 hr tablet   Other Relevant Orders   Renal Function Panel     Other   Dyslipidemia   Relevant Medications   lovastatin (MEVACOR) 20 MG tablet    Other Visit Diagnoses    Hyperlipidemia       Relevant Medications   metoprolol succinate (TOPROL-XL) 25 MG 24 hr tablet   hydrochlorothiazide (HYDRODIURIL) 25 MG tablet   amLODipine (NORVASC) 10 MG tablet   lovastatin (MEVACOR) 20 MG tablet  isosorbide mononitrate (IMDUR) 30 MG 24 hr tablet   Other Relevant Orders   Lipid Profile   Coronary artery disease involving coronary bypass graft of native heart without angina pectoris       Relevant Medications   metoprolol succinate (TOPROL-XL) 25 MG 24 hr tablet   hydrochlorothiazide (HYDRODIURIL) 25 MG tablet   amLODipine (NORVASC) 10 MG tablet   lovastatin (MEVACOR) 20 MG tablet   isosorbide mononitrate (IMDUR) 30 MG 24 hr tablet        Dr. [facility]  01/11/16

## 2016-03-24 DIAGNOSIS — E119 Type 2 diabetes mellitus without complications: Secondary | ICD-10-CM | POA: Diagnosis not present

## 2016-06-13 DIAGNOSIS — R809 Proteinuria, unspecified: Secondary | ICD-10-CM | POA: Diagnosis not present

## 2016-06-13 DIAGNOSIS — E1129 Type 2 diabetes mellitus with other diabetic kidney complication: Secondary | ICD-10-CM | POA: Diagnosis not present

## 2016-06-16 ENCOUNTER — Encounter: Payer: Self-pay | Admitting: Family Medicine

## 2016-06-16 ENCOUNTER — Ambulatory Visit (INDEPENDENT_AMBULATORY_CARE_PROVIDER_SITE_OTHER): Payer: Medicare Other | Admitting: Family Medicine

## 2016-06-16 VITALS — BP 130/90 | HR 78 | Ht 70.0 in | Wt 193.0 lb

## 2016-06-16 DIAGNOSIS — S61001A Unspecified open wound of right thumb without damage to nail, initial encounter: Secondary | ICD-10-CM | POA: Diagnosis not present

## 2016-06-16 DIAGNOSIS — Z23 Encounter for immunization: Secondary | ICD-10-CM

## 2016-06-16 MED ORDER — CEPHALEXIN 500 MG PO CAPS: 500.0000 mg | ORAL_CAPSULE | Freq: Three times a day (TID) | ORAL | 0 refills | Status: DC

## 2016-06-16 MED ORDER — MUPIROCIN 2 % EX OINT: 1.0000 "application " | TOPICAL_OINTMENT | Freq: Two times a day (BID) | CUTANEOUS | 1 refills | Status: DC

## 2016-06-16 NOTE — Patient Instructions (Signed)
How to Change Your Dressing A dressing is a material that is placed in and over wounds. A dressing helps your wound to heal by protecting it from bacteria, further injury, and becoming too dry or too wet. What are the risks? The adhesive tape that is used with a dressing may make your skin sore or irritated or cause a rash. These are the most common problems. However, more serious problems can develop, such as:  Bleeding.  Infection. How to change your dressing How often you change your dressing will depend on your wound. Change the dressing as often as told by your health care provider. Preparing to Change Your Dressing   Take a shower before you do the first dressing change of the day. If your health care provider does not want your wound to get wet and your dressing is not waterproof, you may need to apply plastic leak-proof sealing wrap to your dressing for protection.  If needed, take pain medicine 30 minutes before the dressing change as prescribed by your health care provider.  Set up a clean station for wound care. You will need:  A disposable garbage bag that is open and ready to use.  Hand sanitizer.  Wound cleanser or salt-water solution (saline) as told by your health care provider.  New dressing material or bandages. Make sure to open the dressing package so the dressing remains on the inside of the package. You may also need the following in your clean station:  A box of vinyl gloves.  Tape.  Skin protectant. This may be a wipe, film, or spray.  Clean or germ-free (sterile) scissors.  A cotton-tipped applicator. Removing Your Old Dressing   Wash your hands with soap and water. Dry your hands with a clean towel. If soap and water are not available, use hand sanitizer.  If you are using gloves, put the gloves on before you remove the dressing.  Gently remove any adhesive or tape by pulling it off in the direction of your hair growth. Only touch the outside edges  of the dressing.  Take off the dressing. If the dressing sticks to your skin, use a sterile salt-water solution to wet the dressing. This helps it to come off more easily.  Remove any gauze or packing in your wound.  Throw the old dressing supplies into the ready garbage bag.  Remove each glove by grabbing the cuff with the opposite hand and turning the glove inside out. Place the gloves in the trash immediately.  Wash your hands with soap and water. Dry your hands with a clean towel. If soap and water are not available, use hand sanitizer. Cleaning Your Wound   Follow instructions from your health care provider about how to clean your wound. This may include using a saline or recommended wound cleanser.  Do not use over-the-counter medicated or antiseptic creams, sprays, liquids, or dressings unless told to do so by your health care provider.  Use a clean gauze pad to clean the area thoroughly with the recommended saline solution or wound cleanser.  Throw the gauze pad into the garbage bag.  Wash your hands with soap and water. Dry your hands with a clean towel. If soap and water are not available, use hand sanitizer. Applying the Dressing   If your health care provider recommended a skin protectant, apply it to the skin around the wound.  Cover the wound with the recommended dressing, such as a nonstick gauze or bandage. Make sure to touch only   the outside edges of the dressing. Do not touch the inside of the dressing.  Secure the dressing so all sides stay in place. You may do this with the attached medical adhesive, roll gauze, or tape. If you use tape, do not wrap the tape all the way around your arm or leg.  Take off your gloves. Put them in the plastic bag with the old dressing. Tie the bag shut and throw it away.  Wash your hands with soap and water. Dry your hands with a clean towel. If soap and water are not available, use hand sanitizer. Contact a health care provider  if:   You have new pain.  You develop irritation, a rash, or itching around the wound or dressing.  Changing your dressing causes pain or a lot of bleeding. Get help right away if:  You have severe pain.  You have signs of infection, such as:  More redness, swelling, or pain.  More fluid or blood.  Warmth.  Pus or a bad smell.  Red streaks leading from wound.  A fever. This information is not intended to replace advice given to you by your health care provider. Make sure you discuss any questions you have with your health care provider. Document Released: 05/15/2004 Document Revised: 09/05/2015 Document Reviewed: 01/11/2015 [employer] Interactive Patient Education  2017 [employer]  

## 2016-06-16 NOTE — Progress Notes (Signed)
Name: Kenneth Mitchell   MRN: [id number]    DOB: 05/18/1933   Date:06/16/2016       Progress Note  Subjective  Chief Complaint  Chief Complaint  Patient presents with  . Animal Bite    dog bit back of R) thumb- appears to be white, not sore. Happened 2 days ago    Animal Bite   The incident occurred more than 2 days ago. Incident location: golf. There is an injury to the right thumb. The pain is moderate. Pertinent negatives include no chest pain, no abdominal pain, no nausea, no headaches, no neck pain, no focal weakness, no tingling and no cough.    No problem-specific Assessment & Plan notes found for this encounter.   Past Medical History:  Diagnosis Date  . CAD (coronary artery disease)   . Diabetes mellitus (HCC)   . Gross hematuria   . Hyperlipidemia   . Hypertension   . Inguinal hernia   . Nodular prostate   . Syncope     Past Surgical History:  Procedure Laterality Date  . aneurysm surgery     x 2  . quadrupal bypass      Family History  Problem Relation Age of Onset  . Diabetes Mother   . Heart disease Father   . Stroke Father   . Cancer Brother   . Kidney disease Neg Hx   . Prostate cancer Neg Hx     Social History   Social History  . Marital status: Married    Spouse name: N/A  . Number of children: N/A  . Years of education: N/A   Occupational History  . Not on file.   Social History Main Topics  . Smoking status: Former Games developer  . Smokeless tobacco: Never Used     Comment: quit 20 years  . Alcohol use No  . Drug use: No  . Sexual activity: No   Other Topics Concern  . Not on file   Social History Narrative  . No narrative on file    No Known Allergies  Outpatient Medications Prior to Visit  Medication Sig Dispense Refill  . amLODipine (NORVASC) 10 MG tablet Take 1 tablet (10 mg total) by mouth daily at 6 (six) AM. 90 tablet 1  . aspirin 81 MG tablet Take 81 mg by mouth daily.    . clopidogrel (PLAVIX) 75 MG tablet Take 1 tablet  (75 mg total) by mouth daily at 6 (six) AM. 90 tablet 1  . glipiZIDE (GLUCOTROL) 5 MG tablet Take 5 mg by mouth 2 (two) times daily before a meal. Dr Deloria Lair    . hydrochlorothiazide (HYDRODIURIL) 25 MG tablet Take 1 tablet (25 mg total) by mouth daily at 6 (six) AM. 90 tablet 1  . isosorbide mononitrate (IMDUR) 30 MG 24 hr tablet Take 1 tablet (30 mg total) by mouth daily at 6 (six) AM. 90 tablet 1  . lovastatin (MEVACOR) 20 MG tablet Take 1 tablet (20 mg total) by mouth daily at 6 (six) AM. 90 tablet 1  . metFORMIN (GLUCOPHAGE) 500 MG tablet Take 1 tablet (500 mg total) by mouth 2 (two) times daily with a meal. 60 tablet 0  . metoprolol succinate (TOPROL-XL) 25 MG 24 hr tablet Take 1 tablet (25 mg total) by mouth daily at 6 (six) AM. 90 tablet 1  . nitroGLYCERIN (NITRODUR - DOSED IN MG/24 HR) 0.2 mg/hr patch UNWRAP AND APPLY 1 PATCH ONCE DAILY, LEAVE ON FOR 12-14 HOURS(WAIT 10-12 HOURS BEFORE  NEXT PATCH) 90 patch 0   No facility-administered medications prior to visit.     Review of Systems  Constitutional: Negative for chills, fever, malaise/fatigue and weight loss.  HENT: Negative for ear discharge, ear pain and sore throat.   Eyes: Negative for blurred vision.  Respiratory: Negative for cough, sputum production, shortness of breath and wheezing.   Cardiovascular: Negative for chest pain, palpitations and leg swelling.  Gastrointestinal: Negative for abdominal pain, blood in stool, constipation, diarrhea, heartburn, melena and nausea.  Genitourinary: Negative for dysuria, frequency, hematuria and urgency.  Musculoskeletal: Negative for back pain, joint pain, myalgias and neck pain.  Skin: Negative for rash.  Neurological: Negative for dizziness, tingling, sensory change, focal weakness and headaches.  Endo/Heme/Allergies: Negative for environmental allergies and polydipsia. Does not bruise/bleed easily.  Psychiatric/Behavioral: Negative for depression and suicidal ideas. The patient is not  nervous/anxious and does not have insomnia.      Objective  Vitals:   06/16/16 1440  BP: 130/90  Pulse: 78  Weight: 193 lb (87.5 kg)  Height: 5\' 10"  (1.778 m)    Physical Exam  Constitutional: He is oriented to person, place, and time and well-developed, well-nourished, and in no distress.  HENT:  Head: Normocephalic.  Right Ear: External ear normal.  Left Ear: External ear normal.  Nose: Nose normal.  Mouth/Throat: Oropharynx is clear and moist.  Eyes: Conjunctivae and EOM are normal. Pupils are equal, round, and reactive to light. Right eye exhibits no discharge. Left eye exhibits no discharge. No scleral icterus.  Neck: Normal range of motion. Neck supple. No JVD present. No tracheal deviation present. No thyromegaly present.  Cardiovascular: Normal rate, regular rhythm, normal heart sounds and intact distal pulses.  Exam reveals no gallop and no friction rub.   No murmur heard. Pulmonary/Chest: Breath sounds normal. No respiratory distress. He has no wheezes. He has no rales.  Abdominal: Soft. Bowel sounds are normal. He exhibits no mass. There is no hepatosplenomegaly. There is no tenderness. There is no rebound, no guarding and no CVA tenderness.  Musculoskeletal: Normal range of motion. He exhibits no edema or tenderness.       Hands: Devitalize tissue   Lymphadenopathy:    He has no cervical adenopathy.  Neurological: He is alert and oriented to person, place, and time. He has normal sensation, normal strength, normal reflexes and intact cranial nerves.  Skin: Skin is warm. Laceration noted. No rash noted.  Avulsed area of distal right thumb  Psychiatric: Mood and affect normal.  Nursing note and vitals reviewed.     Assessment & Plan  Problem List Items Addressed This Visit    None    Visit Diagnoses    Avulsion of skin of right thumb, initial encounter    -  Primary   discussed procedure with patient / digital block 1% / avulsed area excised/ dressing    Relevant Medications   mupirocin ointment (BACTROBAN) 2 %   cephALEXin (KEFLEX) 500 MG capsule   Need for diphtheria-tetanus-pertussis (Tdap) vaccine       Relevant Orders   Tdap vaccine greater than or equal to 7yo IM (Completed)      Meds ordered this encounter  Medications  . mupirocin ointment (BACTROBAN) 2 %    Sig: Apply 1 application topically 2 (two) times daily.    Dispense:  22 g    Refill:  1  . cephALEXin (KEFLEX) 500 MG capsule    Sig: Take 1 capsule (500 mg total) by mouth  3 (three) times daily.    Dispense:  30 capsule    Refill:  0      Dr. Elizabeth Sauereanna Jones [facility]  06/16/16

## 2016-06-20 ENCOUNTER — Other Ambulatory Visit: Payer: Self-pay | Admitting: Family Medicine

## 2016-06-20 ENCOUNTER — Ambulatory Visit: Payer: Medicare Other | Admitting: Family Medicine

## 2016-06-26 ENCOUNTER — Emergency Department
Admission: EM | Admit: 2016-06-26 | Discharge: 2016-06-26 | Disposition: A | Discharge status: Home / Self Care | Payer: Medicare Other | Attending: Emergency Medicine | Admitting: Emergency Medicine

## 2016-06-26 ENCOUNTER — Emergency Department: Payer: Medicare Other

## 2016-06-26 DIAGNOSIS — S2232XA Fracture of one rib, left side, initial encounter for closed fracture: Secondary | ICD-10-CM

## 2016-06-26 DIAGNOSIS — E119 Type 2 diabetes mellitus without complications: Secondary | ICD-10-CM | POA: Diagnosis not present

## 2016-06-26 DIAGNOSIS — Y929 Unspecified place or not applicable: Secondary | ICD-10-CM | POA: Insufficient documentation

## 2016-06-26 DIAGNOSIS — S0031XA Abrasion of nose, initial encounter: Secondary | ICD-10-CM | POA: Insufficient documentation

## 2016-06-26 DIAGNOSIS — W1839XA Other fall on same level, initial encounter: Secondary | ICD-10-CM | POA: Insufficient documentation

## 2016-06-26 DIAGNOSIS — Y9389 Activity, other specified: Secondary | ICD-10-CM | POA: Insufficient documentation

## 2016-06-26 DIAGNOSIS — I2510 Atherosclerotic heart disease of native coronary artery without angina pectoris: Secondary | ICD-10-CM | POA: Insufficient documentation

## 2016-06-26 DIAGNOSIS — Y999 Unspecified external cause status: Secondary | ICD-10-CM | POA: Insufficient documentation

## 2016-06-26 DIAGNOSIS — I10 Essential (primary) hypertension: Secondary | ICD-10-CM | POA: Diagnosis not present

## 2016-06-26 DIAGNOSIS — Z79899 Other long term (current) drug therapy: Secondary | ICD-10-CM | POA: Diagnosis not present

## 2016-06-26 DIAGNOSIS — Z7984 Long term (current) use of oral hypoglycemic drugs: Secondary | ICD-10-CM | POA: Diagnosis not present

## 2016-06-26 DIAGNOSIS — Z7982 Long term (current) use of aspirin: Secondary | ICD-10-CM | POA: Insufficient documentation

## 2016-06-26 DIAGNOSIS — S2231XA Fracture of one rib, right side, initial encounter for closed fracture: Secondary | ICD-10-CM | POA: Diagnosis not present

## 2016-06-26 DIAGNOSIS — Z87891 Personal history of nicotine dependence: Secondary | ICD-10-CM | POA: Diagnosis not present

## 2016-06-26 DIAGNOSIS — S299XXA Unspecified injury of thorax, initial encounter: Secondary | ICD-10-CM | POA: Diagnosis present

## 2016-06-26 MED ORDER — TRAMADOL HCL 50 MG PO TABS: ORAL_TABLET | ORAL | 0 refills | Status: DC

## 2016-06-26 NOTE — ED Notes (Signed)
See triage note  States he stepped on rock and fell backwards  Having pain  To upper /mid back

## 2016-06-26 NOTE — ED Provider Notes (Signed)
[facility] Emergency Department Provider Note   ____________________________________________   First MD Initiated Contact with Patient 06/26/16 1323     (approximate)  I have reviewed the triage vital signs and the nursing notes.   HISTORY  Chief Complaint Fall    HPI Kenneth Mitchell is a 83 y.o. male is here with complaint of upper back pain. Patient states that he fell yesterday while looking for cough pulse. He states that on some rocks and fell backwards while picking up small golf balls. He denies any head injury or loss of consciousness. He states that pain is increased with deep inspiration and also with movement of his left shoulder. Patient has been taking ibuprofen at home with some relief of his pain. He denies any visual changes since his fall and actually drove himself to the emergency room today. He rates his pain as 5 out of 10. Patient has remained ambulatory since his fall.   Past Medical History:  Diagnosis Date  . CAD (coronary artery disease)   . Diabetes mellitus (HCC)   . Gross hematuria   . Hyperlipidemia   . Hypertension   . Inguinal hernia   . Nodular prostate   . Syncope     Patient Active Problem List   Diagnosis Date Noted  . Gross hematuria 03/26/2015  . History of elevated PSA 03/26/2015  . Nodular prostate 03/26/2015  . Insect bite 12/08/2014  . Arteriosclerosis of coronary artery 11/29/2014  . AAA (abdominal aortic aneurysm) (HCC) 10/04/2013  . Carotid artery narrowing 10/04/2013  . Dyslipidemia 10/04/2013  . BP (high blood pressure) 10/04/2013  . Excessive urination at night 10/04/2013    Past Surgical History:  Procedure Laterality Date  . aneurysm surgery     x 2  . quadrupal bypass      Prior to Admission medications   Medication Sig Start Date End Date Taking? Authorizing Provider  amLODipine (NORVASC) 10 MG tablet Take 1 tablet (10 mg total) by mouth daily at 6 (six) AM. 01/11/16   Duanne Limerickeanna C Jones,  MD  aspirin 81 MG tablet Take 81 mg by mouth daily.    Historical Provider, MD  clopidogrel (PLAVIX) 75 MG tablet Take 1 tablet (75 mg total) by mouth daily at 6 (six) AM. 01/11/16   Duanne Limerickeanna C Jones, MD  glipiZIDE (GLUCOTROL) 5 MG tablet Take 5 mg by mouth 2 (two) times daily before a meal. Dr Deloria LairAbby    Historical Provider, MD  hydrochlorothiazide (HYDRODIURIL) 25 MG tablet Take 1 tablet (25 mg total) by mouth daily at 6 (six) AM. 01/11/16   Duanne Limerickeanna C Jones, MD  isosorbide mononitrate (IMDUR) 30 MG 24 hr tablet Take 1 tablet (30 mg total) by mouth daily at 6 (six) AM. 01/11/16   Duanne Limerickeanna C Jones, MD  lisinopril (PRINIVIL,ZESTRIL) 5 MG tablet Take 1 tablet by mouth daily. 06/13/16 06/13/17  Historical Provider, MD  lovastatin (MEVACOR) 20 MG tablet Take 1 tablet (20 mg total) by mouth daily at 6 (six) AM. 01/11/16   Duanne Limerickeanna C Jones, MD  metFORMIN (GLUCOPHAGE) 500 MG tablet Take 1 tablet (500 mg total) by mouth 2 (two) times daily with a meal. 04/09/15   Duanne Limerickeanna C Jones, MD  metoprolol succinate (TOPROL-XL) 25 MG 24 hr tablet Take 1 tablet (25 mg total) by mouth daily at 6 (six) AM. 01/11/16   Duanne Limerickeanna C Jones, MD  mupirocin ointment (BACTROBAN) 2 % Apply 1 application topically 2 (two) times daily. 06/16/16   Deanna C  Yetta Barre, MD  nitroGLYCERIN (NITRODUR - DOSED IN MG/24 HR) 0.2 mg/hr patch UNWRAP AND APPLY 1 PATCH ONCE DAILY, LEAVE ON FOR 12-14 HOURS(WAIT 10-12 HOURS BEFORE NEXT PATCH) 10/15/15   Duanne Limerick, MD  traMADol Janean Sark) 50 MG tablet 1 tablet every 6-8 hours prn pain 06/26/16   Tommi Rumps, PA-C    Allergies Patient has no known allergies.  Family History  Problem Relation Age of Onset  . Diabetes Mother   . Heart disease Father   . Stroke Father   . Cancer Brother   . Kidney disease Neg Hx   . Prostate cancer Neg Hx     Social History Social History  Substance Use Topics  . Smoking status: Former Games developer  . Smokeless tobacco: Never Used     Comment: quit 20 years  . Alcohol use No     Review of Systems Constitutional: No fever/chills Eyes: No visual changes. ENT: Abrasion to nose. Cardiovascular: Denies chest pain. Respiratory: Denies shortness of breath. Gastrointestinal: No abdominal pain.  No nausea, no vomiting.  Genitourinary: Negative for dysuria. Musculoskeletal: Positive left upper back pain. Skin: Negative for rash. Neurological: Negative for headaches, focal weakness or numbness.  10-point ROS otherwise negative.  ____________________________________________   PHYSICAL EXAM:  VITAL SIGNS: ED Triage Vitals  Enc Vitals Group     BP 06/26/16 1248 (!) 195/98     Pulse Rate 06/26/16 1248 75     Resp 06/26/16 1248 18     Temp 06/26/16 1248 97.1 F (36.2 C)     Temp Source 06/26/16 1248 Oral     SpO2 06/26/16 1248 98 %     Weight 06/26/16 1249 197 lb (89.4 kg)     Height 06/26/16 1249 5\' 11"  (1.803 m)     Head Circumference --      Peak Flow --      Pain Score 06/26/16 1249 5     Pain Loc --      Pain Edu? --      Excl. in GC? --     Constitutional: Alert and oriented. Well appearing and in no acute distress. Eyes: Conjunctivae are normal. PERRL. EOMI. Head: Atraumatic. Nose: No congestion/rhinnorhea.  Nontender to palpation. No gross deformity was noted of the nose. Neck: No stridor.  No cervical tenderness on palpation posteriorly. Range of motion is without restriction. Cardiovascular: Normal rate, regular rhythm. Grossly normal heart sounds.  Good peripheral circulation. Respiratory: Normal respiratory effort.  No retractions. Lungs CTAB. Gastrointestinal: Soft and nontender. No distention.  Musculoskeletal: On examination of the left upper back there is actually some tenderness around the left scapula. There is no tenderness on palpation of the thoracic or lumbar spine. There is minimal soft tissue edema present. Scapular area. Range of motion is decreased in the shoulder secondary to pain. No obvious deformity is noted. No abrasions or  ecchymosis is seen. Neurologic:  Normal speech and language. No gross focal neurologic deficits are appreciated. No gait instability. Skin:  Skin is warm, dry. There is superficial abrasion to the left nasal bone without active bleeding and no evidence of infection. Psychiatric: Mood and affect are normal. Speech and behavior are normal.  ____________________________________________   LABS (all labs ordered are listed, but only abnormal results are displayed)  Labs Reviewed - No data to display   RADIOLOGY Left scapula per radiologist: IMPRESSION:  Normal scapula. Mildly displaced fracture involving posterior  portion of left second rib.     ____________________________________________   PROCEDURES  Procedure(s) performed: None  Procedures  Critical Care performed: No  ____________________________________________   INITIAL IMPRESSION / ASSESSMENT AND PLAN / ED COURSE  Pertinent labs & imaging results that were available during my care of the patient were reviewed by me and considered in my medical decision making (see chart for details).  Patient was made aware that he does have a nondisplaced fracture left second rib posteriorly. Patient states that he hopes that ibuprofen is strong enough to help with his pain. He was given a prescription for tramadol to take 1 every 6-8 hours if needed for pain. He is aware that this medication could cause drowsiness and increase his risk for falling. He was also given a sling to wear for support of his left arm. Patient states that he was driving and he will "tough it out". Patient is encouraged to follow up with his PCP if any continued problems.      ____________________________________________   FINAL CLINICAL IMPRESSION(S) / ED DIAGNOSES  Final diagnoses:  Closed fracture of one rib of left side, initial encounter      NEW MEDICATIONS STARTED DURING THIS VISIT:  Discharge Medication List as of 06/26/2016  2:10 PM      START taking these medications   Details  traMADol (ULTRAM) 50 MG tablet 1 tablet every 6-8 hours prn pain, Print         Note:  This document was prepared using Dragon voice recognition software and may include unintentional dictation errors.    Tommi Rumps, PA-C 06/26/16 1432    Myrna Blazer, MD 06/26/16 [phone]

## 2016-06-26 NOTE — Discharge Instructions (Signed)
Follow-up with Dr. Yetta BarreJones if any continued problems. Wear sling as needed for support. Continue taking ibuprofen with food as needed for pain. May also take tramadol 1 tablet every 6-8 hours as needed for pain. Be aware that this medication could cause drowsiness increase your risk for falling. You may also use ice to the area to help with pain control.

## 2016-06-26 NOTE — ED Triage Notes (Signed)
Pt fell yesterday while looking for golf balls, pt denies LOC or hitting his head, pt complains of back pain

## 2016-06-26 NOTE — ED Notes (Signed)
Pt refused sling and stated it would get in the way and he couldn't drive with it on. He would toughen it out (laughing).

## 2016-06-30 ENCOUNTER — Ambulatory Visit (INDEPENDENT_AMBULATORY_CARE_PROVIDER_SITE_OTHER): Payer: Medicare Other | Admitting: Family Medicine

## 2016-06-30 ENCOUNTER — Encounter: Payer: Self-pay | Admitting: Family Medicine

## 2016-06-30 VITALS — BP 120/80 | HR 72 | Ht 71.0 in | Wt 197.0 lb

## 2016-06-30 DIAGNOSIS — H6121 Impacted cerumen, right ear: Secondary | ICD-10-CM | POA: Diagnosis not present

## 2016-06-30 DIAGNOSIS — S2232XD Fracture of one rib, left side, subsequent encounter for fracture with routine healing: Secondary | ICD-10-CM | POA: Diagnosis not present

## 2016-06-30 MED ORDER — HYDROCODONE-ACETAMINOPHEN 5-325 MG PO TABS: 1.0000 | ORAL_TABLET | Freq: Four times a day (QID) | ORAL | 0 refills | Status: DC | PRN

## 2016-06-30 NOTE — Progress Notes (Signed)
Name: Kenneth Mitchell   MRN: [id number]    DOB: 07/28/1933   Date:06/30/2016       Progress Note  Subjective  Chief Complaint  Chief Complaint  Patient presents with  . Follow-up    fall- hurting "pain medicine not helping"    Other  This is a new (slipped on rock/4 days ago) problem. The current episode started in the past 7 days. The problem occurs constantly. The problem has been waxing and waning. Associated symptoms include chest pain. Pertinent negatives include no abdominal pain, anorexia, arthralgias, change in bowel habit, chills, congestion, coughing, diaphoresis, fatigue, fever, headaches, joint swelling, myalgias, nausea, neck pain, numbness, rash, sore throat, swollen glands, urinary symptoms, vertigo, visual change, vomiting or weakness. Associated symptoms comments: Chest wall pain with cough/deep inspiration. The symptoms are aggravated by coughing, bending, twisting and exertion. He has tried NSAIDs (tramadol) for the symptoms. The treatment provided no relief.    No problem-specific Assessment & Plan notes found for this encounter.   Past Medical History:  Diagnosis Date  . CAD (coronary artery disease)   . Diabetes mellitus (HCC)   . Gross hematuria   . Hyperlipidemia   . Hypertension   . Inguinal hernia   . Nodular prostate   . Syncope     Past Surgical History:  Procedure Laterality Date  . aneurysm surgery     x 2  . quadrupal bypass      Family History  Problem Relation Age of Onset  . Diabetes Mother   . Heart disease Father   . Stroke Father   . Cancer Brother   . Kidney disease Neg Hx   . Prostate cancer Neg Hx     Social History   Social History  . Marital status: Married    Spouse name: N/A  . Number of children: N/A  . Years of education: N/A   Occupational History  . Not on file.   Social History Main Topics  . Smoking status: Former Games developermoker  . Smokeless tobacco: Never Used     Comment: quit 20 years  . Alcohol use No  . Drug  use: No  . Sexual activity: No   Other Topics Concern  . Not on file   Social History Narrative  . No narrative on file    No Known Allergies  Outpatient Medications Prior to Visit  Medication Sig Dispense Refill  . amLODipine (NORVASC) 10 MG tablet Take 1 tablet (10 mg total) by mouth daily at 6 (six) AM. 90 tablet 1  . aspirin 81 MG tablet Take 81 mg by mouth daily.    . clopidogrel (PLAVIX) 75 MG tablet Take 1 tablet (75 mg total) by mouth daily at 6 (six) AM. 90 tablet 1  . glipiZIDE (GLUCOTROL) 5 MG tablet Take 5 mg by mouth 2 (two) times daily before a meal. Dr Deloria LairAbby    . hydrochlorothiazide (HYDRODIURIL) 25 MG tablet Take 1 tablet (25 mg total) by mouth daily at 6 (six) AM. 90 tablet 1  . isosorbide mononitrate (IMDUR) 30 MG 24 hr tablet Take 1 tablet (30 mg total) by mouth daily at 6 (six) AM. 90 tablet 1  . lisinopril (PRINIVIL,ZESTRIL) 5 MG tablet Take 1 tablet by mouth daily.    Marland Kitchen. lovastatin (MEVACOR) 20 MG tablet Take 1 tablet (20 mg total) by mouth daily at 6 (six) AM. 90 tablet 1  . metFORMIN (GLUCOPHAGE) 500 MG tablet Take 1 tablet (500 mg total) by mouth 2 (two)  times daily with a meal. 60 tablet 0  . metoprolol succinate (TOPROL-XL) 25 MG 24 hr tablet Take 1 tablet (25 mg total) by mouth daily at 6 (six) AM. 90 tablet 1  . mupirocin ointment (BACTROBAN) 2 % Apply 1 application topically 2 (two) times daily. 22 g 1  . nitroGLYCERIN (NITRODUR - DOSED IN MG/24 HR) 0.2 mg/hr patch UNWRAP AND APPLY 1 PATCH ONCE DAILY, LEAVE ON FOR 12-14 HOURS(WAIT 10-12 HOURS BEFORE NEXT PATCH) 90 patch 0  . traMADol (ULTRAM) 50 MG tablet 1 tablet every 6-8 hours prn pain 15 tablet 0   No facility-administered medications prior to visit.     Review of Systems  Constitutional: Negative for chills, diaphoresis, fatigue, fever, malaise/fatigue and weight loss.  HENT: Negative for congestion, ear discharge, ear pain and sore throat.   Eyes: Negative for blurred vision.  Respiratory: Negative  for cough, sputum production, shortness of breath and wheezing.   Cardiovascular: Positive for chest pain. Negative for palpitations and leg swelling.  Gastrointestinal: Negative for abdominal pain, anorexia, blood in stool, change in bowel habit, constipation, diarrhea, heartburn, melena, nausea and vomiting.  Genitourinary: Negative for dysuria, frequency, hematuria and urgency.  Musculoskeletal: Negative for arthralgias, back pain, joint pain, joint swelling, myalgias and neck pain.  Skin: Negative for rash.  Neurological: Negative for dizziness, vertigo, tingling, sensory change, focal weakness, weakness, numbness and headaches.  Endo/Heme/Allergies: Negative for environmental allergies and polydipsia. Does not bruise/bleed easily.  Psychiatric/Behavioral: Negative for depression and suicidal ideas. The patient is not nervous/anxious and does not have insomnia.      Objective  Vitals:   06/30/16 0859  BP: 120/80  Pulse: 72  Weight: 197 lb (89.4 kg)  Height: 5\' 11"  (1.803 m)    Physical Exam  Constitutional: He is oriented to person, place, and time and well-developed, well-nourished, and in no distress.  HENT:  Head: Normocephalic.  Right Ear: External ear normal. A foreign body is present.  Left Ear: Tympanic membrane, external ear and ear canal normal.  Nose: Nose normal.  Mouth/Throat: Oropharynx is clear and moist.  Cerumen impaction right   Eyes: Conjunctivae and EOM are normal. Pupils are equal, round, and reactive to light. Right eye exhibits no discharge. Left eye exhibits no discharge. No scleral icterus.  Neck: Normal range of motion. Neck supple. No JVD present. No tracheal deviation present. No thyromegaly present.  Cardiovascular: Normal rate, regular rhythm, normal heart sounds and intact distal pulses.  Exam reveals no gallop and no friction rub.   No murmur heard. Pulmonary/Chest: Effort normal and breath sounds normal. No respiratory distress. He has no  wheezes. He has no rales. He exhibits tenderness and bony tenderness.  Tenderness left posterior 2nd rib  Abdominal: Soft. Bowel sounds are normal. He exhibits no mass. There is no hepatosplenomegaly. There is no tenderness. There is no rebound, no guarding and no CVA tenderness.  Musculoskeletal: Normal range of motion. He exhibits tenderness. He exhibits no edema.  Lymphadenopathy:    He has no cervical adenopathy.  Neurological: He is alert and oriented to person, place, and time. He has normal sensation, normal strength, normal reflexes and intact cranial nerves. No cranial nerve deficit.  Skin: Skin is warm. No rash noted.  Psychiatric: Mood and affect normal.  Nursing note and vitals reviewed.     Assessment & Plan  Problem List Items Addressed This Visit    None    Visit Diagnoses    Closed fracture of one rib of left  side with routine healing, subsequent encounter    -  Primary   Relevant Medications   HYDROcodone-acetaminophen (NORCO/VICODIN) 5-325 MG tablet   Impacted cerumen of right ear       irragation right cerumen       Meds ordered this encounter  Medications  . HYDROcodone-acetaminophen (NORCO/VICODIN) 5-325 MG tablet    Sig: Take 1 tablet by mouth every 6 (six) hours as needed for moderate pain.    Dispense:  30 tablet    Refill:  0      Dr. [facility]  06/30/16

## 2016-07-09 ENCOUNTER — Ambulatory Visit (INDEPENDENT_AMBULATORY_CARE_PROVIDER_SITE_OTHER): Payer: Medicare Other | Admitting: Family Medicine

## 2016-07-09 ENCOUNTER — Encounter: Payer: Self-pay | Admitting: Family Medicine

## 2016-07-09 VITALS — BP 130/70 | HR 64 | Ht 71.0 in | Wt 196.0 lb

## 2016-07-09 DIAGNOSIS — Z23 Encounter for immunization: Secondary | ICD-10-CM

## 2016-07-09 DIAGNOSIS — I10 Essential (primary) hypertension: Secondary | ICD-10-CM | POA: Diagnosis not present

## 2016-07-09 DIAGNOSIS — E785 Hyperlipidemia, unspecified: Secondary | ICD-10-CM

## 2016-07-09 DIAGNOSIS — I2510 Atherosclerotic heart disease of native coronary artery without angina pectoris: Secondary | ICD-10-CM | POA: Diagnosis not present

## 2016-07-09 MED ORDER — LOVASTATIN 20 MG PO TABS: 20.0000 mg | ORAL_TABLET | Freq: Every day | ORAL | 1 refills | Status: DC

## 2016-07-09 MED ORDER — ISOSORBIDE MONONITRATE ER 30 MG PO TB24: 30.0000 mg | ORAL_TABLET | Freq: Every day | ORAL | 1 refills | Status: DC

## 2016-07-09 MED ORDER — CLOPIDOGREL BISULFATE 75 MG PO TABS: 75.0000 mg | ORAL_TABLET | Freq: Every day | ORAL | 1 refills | Status: DC

## 2016-07-09 MED ORDER — AMLODIPINE BESYLATE 10 MG PO TABS: 10.0000 mg | ORAL_TABLET | Freq: Every day | ORAL | 1 refills | Status: DC

## 2016-07-09 MED ORDER — LISINOPRIL 5 MG PO TABS: 5.0000 mg | ORAL_TABLET | Freq: Every day | ORAL | 1 refills | Status: DC

## 2016-07-09 MED ORDER — HYDROCHLOROTHIAZIDE 25 MG PO TABS: 25.0000 mg | ORAL_TABLET | Freq: Every day | ORAL | 1 refills | Status: DC

## 2016-07-09 NOTE — Progress Notes (Signed)
Name: Kenneth Mitchell   MRN: [id number]    DOB: 1933-07-01   Date:07/09/2016       Progress Note  Subjective  Chief Complaint  Chief Complaint  Patient presents with  . Hypertension  . Hyperlipidemia  . Coronary Artery Disease    takes plavix    Hypertension  This is a chronic problem. The current episode started more than 1 year ago. The problem has been gradually improving since onset. The problem is controlled. Pertinent negatives include no anxiety, blurred vision, chest pain, headaches, malaise/fatigue, neck pain, orthopnea, palpitations, peripheral edema, PND, shortness of breath or sweats. There are no associated agents to hypertension. Risk factors for coronary artery disease include diabetes mellitus, dyslipidemia and male gender. Past treatments include calcium channel blockers, beta blockers, diuretics and ACE inhibitors. The current treatment provides moderate improvement. There are no compliance problems.  Hypertensive end-organ damage includes CAD/MI and CVA. There is no history of angina, kidney disease, heart failure, left ventricular hypertrophy, PVD or retinopathy. There is no history of chronic renal disease, a hypertension causing med or renovascular disease.  Hyperlipidemia  This is a chronic problem. The current episode started more than 1 year ago. The problem is controlled. Recent lipid tests were reviewed and are normal. He has no history of chronic renal disease, diabetes, hypothyroidism, liver disease, obesity or nephrotic syndrome. Factors aggravating his hyperlipidemia include thiazides. Pertinent negatives include no chest pain, focal sensory loss, focal weakness, leg pain, myalgias or shortness of breath. Current antihyperlipidemic treatment includes statins. The current treatment provides mild improvement of lipids. There are no compliance problems.  Risk factors for coronary artery disease include male sex, dyslipidemia and diabetes mellitus.  Coronary Artery Disease   Presents for follow-up visit. Pertinent negatives include no chest pain, chest pressure, chest tightness, dizziness, leg swelling, muscle weakness, palpitations, shortness of breath or weight gain. Risk factors include hyperlipidemia and hypertension. Risk factors do not include obesity. The symptoms have been stable. Compliance with diet is good. Compliance with exercise is good. Compliance with medications is good.    No problem-specific Assessment & Plan notes found for this encounter.   Past Medical History:  Diagnosis Date  . CAD (coronary artery disease)   . Diabetes mellitus (HCC)   . Gross hematuria   . Hyperlipidemia   . Hypertension   . Inguinal hernia   . Nodular prostate   . Syncope     Past Surgical History:  Procedure Laterality Date  . aneurysm surgery     x 2  . quadrupal bypass      Family History  Problem Relation Age of Onset  . Diabetes Mother   . Heart disease Father   . Stroke Father   . Cancer Brother   . Kidney disease Neg Hx   . Prostate cancer Neg Hx     Social History   Social History  . Marital status: Married    Spouse name: N/A  . Number of children: N/A  . Years of education: N/A   Occupational History  . Not on file.   Social History Main Topics  . Smoking status: Former Games developer  . Smokeless tobacco: Never Used     Comment: quit 20 years  . Alcohol use No  . Drug use: No  . Sexual activity: No   Other Topics Concern  . Not on file   Social History Narrative  . No narrative on file    No Known Allergies  Outpatient Medications Prior to  Visit  Medication Sig Dispense Refill  . aspirin 81 MG tablet Take 81 mg by mouth daily.    Marland Kitchen. glipiZIDE (GLUCOTROL) 5 MG tablet Take 5 mg by mouth 2 (two) times daily before a meal. Dr Deloria LairAbby    . HYDROcodone-acetaminophen (NORCO/VICODIN) 5-325 MG tablet Take 1 tablet by mouth every 6 (six) hours as needed for moderate pain. 30 tablet 0  . metFORMIN (GLUCOPHAGE) 500 MG tablet Take 1  tablet (500 mg total) by mouth 2 (two) times daily with a meal. (Patient taking differently: Take 500 mg by mouth 2 (two) times daily with a meal. Dr Deloria LairAbby) 60 tablet 0  . metoprolol succinate (TOPROL-XL) 25 MG 24 hr tablet Take 1 tablet (25 mg total) by mouth daily at 6 (six) AM. 90 tablet 1  . nitroGLYCERIN (NITRODUR - DOSED IN MG/24 HR) 0.2 mg/hr patch UNWRAP AND APPLY 1 PATCH ONCE DAILY, LEAVE ON FOR 12-14 HOURS(WAIT 10-12 HOURS BEFORE NEXT PATCH) 90 patch 0  . amLODipine (NORVASC) 10 MG tablet Take 1 tablet (10 mg total) by mouth daily at 6 (six) AM. 90 tablet 1  . clopidogrel (PLAVIX) 75 MG tablet Take 1 tablet (75 mg total) by mouth daily at 6 (six) AM. 90 tablet 1  . hydrochlorothiazide (HYDRODIURIL) 25 MG tablet Take 1 tablet (25 mg total) by mouth daily at 6 (six) AM. 90 tablet 1  . isosorbide mononitrate (IMDUR) 30 MG 24 hr tablet Take 1 tablet (30 mg total) by mouth daily at 6 (six) AM. 90 tablet 1  . lisinopril (PRINIVIL,ZESTRIL) 5 MG tablet Take 1 tablet by mouth daily.    Marland Kitchen. lovastatin (MEVACOR) 20 MG tablet Take 1 tablet (20 mg total) by mouth daily at 6 (six) AM. 90 tablet 1  . mupirocin ointment (BACTROBAN) 2 % Apply 1 application topically 2 (two) times daily. 22 g 1   No facility-administered medications prior to visit.     Review of Systems  Constitutional: Negative for chills, fever, malaise/fatigue, weight gain and weight loss.  HENT: Negative for ear discharge, ear pain and sore throat.   Eyes: Negative for blurred vision.  Respiratory: Negative for cough, sputum production, chest tightness, shortness of breath and wheezing.   Cardiovascular: Negative for chest pain, palpitations, orthopnea, leg swelling and PND.  Gastrointestinal: Negative for abdominal pain, blood in stool, constipation, diarrhea, heartburn, melena and nausea.  Genitourinary: Negative for dysuria, frequency, hematuria and urgency.  Musculoskeletal: Negative for back pain, joint pain, myalgias, muscle  weakness and neck pain.  Skin: Negative for rash.  Neurological: Negative for dizziness, tingling, sensory change, focal weakness and headaches.  Endo/Heme/Allergies: Negative for environmental allergies and polydipsia. Does not bruise/bleed easily.  Psychiatric/Behavioral: Negative for depression and suicidal ideas. The patient is not nervous/anxious and does not have insomnia.      Objective  Vitals:   07/09/16 0837  BP: 130/70  Pulse: 64  Weight: 196 lb (88.9 kg)  Height: 5\' 11"  (1.803 m)    Physical Exam  Constitutional: He is oriented to person, place, and time and well-developed, well-nourished, and in no distress.  HENT:  Head: Normocephalic.  Right Ear: External ear normal.  Left Ear: External ear normal.  Nose: Nose normal.  Mouth/Throat: Oropharynx is clear and moist.  Eyes: Conjunctivae and EOM are normal. Pupils are equal, round, and reactive to light. Right eye exhibits no discharge. Left eye exhibits no discharge. No scleral icterus.  Neck: Normal range of motion. Neck supple. No JVD present. No tracheal deviation present.  No thyromegaly present.  Cardiovascular: Normal rate, regular rhythm, normal heart sounds and intact distal pulses.  Exam reveals no gallop and no friction rub.   No murmur heard. Pulmonary/Chest: Breath sounds normal. No respiratory distress. He has no wheezes. He has no rales.  Abdominal: Soft. Bowel sounds are normal. He exhibits no mass. There is no hepatosplenomegaly. There is no tenderness. There is no rebound, no guarding and no CVA tenderness.  Musculoskeletal: Normal range of motion. He exhibits no edema or tenderness.  Lymphadenopathy:    He has no cervical adenopathy.  Neurological: He is alert and oriented to person, place, and time. He has normal motor skills, normal sensation, normal strength, normal reflexes and intact cranial nerves. No cranial nerve deficit.  monofilament  Skin: Skin is warm. No rash noted.  Psychiatric: Mood and  affect normal.  Nursing note and vitals reviewed.     Assessment & Plan  Problem List Items Addressed This Visit      Cardiovascular and Mediastinum   Arteriosclerosis of coronary artery   Relevant Medications   clopidogrel (PLAVIX) 75 MG tablet   lovastatin (MEVACOR) 20 MG tablet   hydrochlorothiazide (HYDRODIURIL) 25 MG tablet   amLODipine (NORVASC) 10 MG tablet   lisinopril (PRINIVIL,ZESTRIL) 5 MG tablet   isosorbide mononitrate (IMDUR) 30 MG 24 hr tablet   BP (high blood pressure) - Primary   Relevant Medications   lovastatin (MEVACOR) 20 MG tablet   hydrochlorothiazide (HYDRODIURIL) 25 MG tablet   amLODipine (NORVASC) 10 MG tablet   lisinopril (PRINIVIL,ZESTRIL) 5 MG tablet   isosorbide mononitrate (IMDUR) 30 MG 24 hr tablet     Other   Dyslipidemia   Relevant Medications   lovastatin (MEVACOR) 20 MG tablet    Other Visit Diagnoses    Need for Streptococcus pneumoniae vaccination       Relevant Orders   Pneumococcal conjugate vaccine 13-valent IM (Completed)      Meds ordered this encounter  Medications  . clopidogrel (PLAVIX) 75 MG tablet    Sig: Take 1 tablet (75 mg total) by mouth daily at 6 (six) AM.    Dispense:  90 tablet    Refill:  1  . lovastatin (MEVACOR) 20 MG tablet    Sig: Take 1 tablet (20 mg total) by mouth daily at 6 (six) AM.    Dispense:  90 tablet    Refill:  1  . hydrochlorothiazide (HYDRODIURIL) 25 MG tablet    Sig: Take 1 tablet (25 mg total) by mouth daily at 6 (six) AM.    Dispense:  90 tablet    Refill:  1  . amLODipine (NORVASC) 10 MG tablet    Sig: Take 1 tablet (10 mg total) by mouth daily at 6 (six) AM.    Dispense:  90 tablet    Refill:  1  . lisinopril (PRINIVIL,ZESTRIL) 5 MG tablet    Sig: Take 1 tablet (5 mg total) by mouth daily.    Dispense:  90 tablet    Refill:  1  . isosorbide mononitrate (IMDUR) 30 MG 24 hr tablet    Sig: Take 1 tablet (30 mg total) by mouth daily at 6 (six) AM.    Dispense:  90 tablet     Refill:  1      Dr. [facility]  07/09/16

## 2016-09-11 ENCOUNTER — Other Ambulatory Visit: Payer: Self-pay

## 2016-10-03 DIAGNOSIS — E1129 Type 2 diabetes mellitus with other diabetic kidney complication: Secondary | ICD-10-CM | POA: Diagnosis not present

## 2016-10-03 DIAGNOSIS — R809 Proteinuria, unspecified: Secondary | ICD-10-CM | POA: Diagnosis not present

## 2017-01-13 ENCOUNTER — Other Ambulatory Visit: Payer: Self-pay

## 2017-01-13 ENCOUNTER — Other Ambulatory Visit: Payer: Self-pay | Admitting: Family Medicine

## 2017-01-13 DIAGNOSIS — E785 Hyperlipidemia, unspecified: Secondary | ICD-10-CM

## 2017-01-13 DIAGNOSIS — I10 Essential (primary) hypertension: Secondary | ICD-10-CM

## 2017-01-13 MED ORDER — METOPROLOL SUCCINATE ER 25 MG PO TB24: 25.0000 mg | ORAL_TABLET | Freq: Every day | ORAL | 0 refills | Status: DC

## 2017-01-13 MED ORDER — LOVASTATIN 20 MG PO TABS: 20.0000 mg | ORAL_TABLET | Freq: Every day | ORAL | 0 refills | Status: DC

## 2017-01-30 ENCOUNTER — Ambulatory Visit (INDEPENDENT_AMBULATORY_CARE_PROVIDER_SITE_OTHER): Payer: Medicare Other | Admitting: Family Medicine

## 2017-01-30 ENCOUNTER — Encounter: Payer: Self-pay | Admitting: Family Medicine

## 2017-01-30 VITALS — BP 128/68 | HR 63 | Ht 71.0 in | Wt 196.0 lb

## 2017-01-30 DIAGNOSIS — E119 Type 2 diabetes mellitus without complications: Secondary | ICD-10-CM

## 2017-01-30 DIAGNOSIS — I2510 Atherosclerotic heart disease of native coronary artery without angina pectoris: Secondary | ICD-10-CM | POA: Diagnosis not present

## 2017-01-30 DIAGNOSIS — E1159 Type 2 diabetes mellitus with other circulatory complications: Secondary | ICD-10-CM | POA: Diagnosis not present

## 2017-01-30 DIAGNOSIS — I10 Essential (primary) hypertension: Secondary | ICD-10-CM

## 2017-01-30 DIAGNOSIS — E785 Hyperlipidemia, unspecified: Secondary | ICD-10-CM

## 2017-01-30 DIAGNOSIS — E1142 Type 2 diabetes mellitus with diabetic polyneuropathy: Secondary | ICD-10-CM | POA: Diagnosis not present

## 2017-01-30 DIAGNOSIS — E1165 Type 2 diabetes mellitus with hyperglycemia: Secondary | ICD-10-CM | POA: Diagnosis not present

## 2017-01-30 MED ORDER — AMLODIPINE BESYLATE 10 MG PO TABS: 10.0000 mg | ORAL_TABLET | Freq: Every day | ORAL | 1 refills | Status: DC

## 2017-01-30 MED ORDER — METOPROLOL SUCCINATE ER 25 MG PO TB24: 25.0000 mg | ORAL_TABLET | Freq: Every day | ORAL | 0 refills | Status: DC

## 2017-01-30 MED ORDER — HYDROCHLOROTHIAZIDE 25 MG PO TABS
25.0000 mg | ORAL_TABLET | Freq: Every day | ORAL | 1 refills | Status: DC
Start: 2017-01-30 — End: 2017-01-30

## 2017-01-30 MED ORDER — CLOPIDOGREL BISULFATE 75 MG PO TABS: 75.0000 mg | ORAL_TABLET | Freq: Every day | ORAL | 1 refills | Status: DC

## 2017-01-30 MED ORDER — HYDROCHLOROTHIAZIDE 25 MG PO TABS: 25.0000 mg | ORAL_TABLET | Freq: Every day | ORAL | 1 refills | Status: DC

## 2017-01-30 MED ORDER — NITROGLYCERIN 0.2 MG/HR TD PT24: MEDICATED_PATCH | TRANSDERMAL | 0 refills | Status: DC

## 2017-01-30 MED ORDER — LOVASTATIN 20 MG PO TABS: 20.0000 mg | ORAL_TABLET | Freq: Every day | ORAL | 1 refills | Status: DC

## 2017-01-30 MED ORDER — LISINOPRIL 5 MG PO TABS: 5.0000 mg | ORAL_TABLET | Freq: Every day | ORAL | 1 refills | Status: DC

## 2017-01-30 MED ORDER — ISOSORBIDE MONONITRATE ER 30 MG PO TB24: 30.0000 mg | ORAL_TABLET | Freq: Every day | ORAL | 1 refills | Status: DC

## 2017-01-30 MED ORDER — LOVASTATIN 20 MG PO TABS: 20.0000 mg | ORAL_TABLET | Freq: Every day | ORAL | 0 refills | Status: DC

## 2017-01-30 NOTE — Progress Notes (Signed)
Name: Kenneth Mitchell   MRN: [id number]    DOB: 04/27/1933   Date:01/30/2017       Progress Note  Subjective  Chief Complaint  Chief Complaint  Patient presents with  . Hypertension  . Hyperlipidemia    Hypertension  This is a chronic problem. The current episode started more than 1 year ago. The problem has been waxing and waning since onset. The problem is controlled. Pertinent negatives include no anxiety, blurred vision, chest pain, headaches, malaise/fatigue, neck pain, orthopnea, palpitations, peripheral edema, PND, shortness of breath or sweats. There are no associated agents to hypertension. Risk factors for coronary artery disease include dyslipidemia. Past treatments include ACE inhibitors, calcium channel blockers, beta blockers and diuretics. There are no compliance problems.  There is no history of angina, kidney disease, CAD/MI, CVA, heart failure, left ventricular hypertrophy, PVD or retinopathy. There is no history of chronic renal disease, a hypertension causing med or renovascular disease.  Hyperlipidemia  This is a chronic problem. The current episode started more than 1 year ago. The problem is controlled. Recent lipid tests were reviewed and are normal. He has no history of chronic renal disease, diabetes, hypothyroidism, liver disease, obesity or nephrotic syndrome. There are no known factors aggravating his hyperlipidemia. Pertinent negatives include no chest pain, focal sensory loss, focal weakness, leg pain, myalgias or shortness of breath. Current antihyperlipidemic treatment includes statins. The current treatment provides moderate improvement of lipids. There are no compliance problems.  Risk factors for coronary artery disease include obesity and dyslipidemia.  Diabetes  He presents for his follow-up diabetic visit. He has type 2 diabetes mellitus. Pertinent negatives for hypoglycemia include no confusion, dizziness, headaches, hunger, mood changes,  nervousness/anxiousness, pallor, seizures, sleepiness, speech difficulty, sweats or tremors. Pertinent negatives for diabetes include no blurred vision, no chest pain, no fatigue, no foot paresthesias, no foot ulcerations, no polydipsia, no polyphagia, no polyuria, no visual change, no weakness and no weight loss. There are no hypoglycemic complications. Symptoms are stable. There are no diabetic complications. Pertinent negatives for diabetic complications include no CVA, PVD or retinopathy. Risk factors for coronary artery disease include dyslipidemia and hypertension. His weight is stable. He is following a generally healthy diet. He participates in exercise daily. There is no change in his home blood glucose trend. His breakfast blood glucose is taken between 8-9 am. His breakfast blood glucose range is generally 90-110 mg/dl. An ACE inhibitor/angiotensin II receptor blocker is being taken.    No problem-specific Assessment & Plan notes found for this encounter.   Past Medical History:  Diagnosis Date  . CAD (coronary artery disease)   . Diabetes mellitus (HCC)   . Gross hematuria   . Hyperlipidemia   . Hypertension   . Inguinal hernia   . Nodular prostate   . Syncope     Past Surgical History:  Procedure Laterality Date  . aneurysm surgery     x 2  . quadrupal bypass      Family History  Problem Relation Age of Onset  . Diabetes Mother   . Heart disease Father   . Stroke Father   . Cancer Brother   . Kidney disease Neg Hx   . Prostate cancer Neg Hx     Social History   Social History  . Marital status: Married    Spouse name: N/A  . Number of children: N/A  . Years of education: N/A   Occupational History  . Not on file.   Social History  Main Topics  . Smoking status: Former Games developer  . Smokeless tobacco: Never Used     Comment: quit 20 years  . Alcohol use No  . Drug use: No  . Sexual activity: No   Other Topics Concern  . Not on file   Social History  Narrative  . No narrative on file    No Known Allergies  Outpatient Medications Prior to Visit  Medication Sig Dispense Refill  . aspirin 81 MG tablet Take 81 mg by mouth daily.    Marland Kitchen glipiZIDE (GLUCOTROL) 5 MG tablet Take 5 mg by mouth 2 (two) times daily before a meal. Dr Deloria Lair    . HYDROcodone-acetaminophen (NORCO/VICODIN) 5-325 MG tablet Take 1 tablet by mouth every 6 (six) hours as needed for moderate pain. 30 tablet 0  . metFORMIN (GLUCOPHAGE) 500 MG tablet Take 1 tablet (500 mg total) by mouth 2 (two) times daily with a meal. (Patient taking differently: Take 500 mg by mouth 2 (two) times daily with a meal. Dr Deloria Lair) 60 tablet 0  . amLODipine (NORVASC) 10 MG tablet Take 1 tablet (10 mg total) by mouth daily at 6 (six) AM. 90 tablet 1  . clopidogrel (PLAVIX) 75 MG tablet Take 1 tablet (75 mg total) by mouth daily at 6 (six) AM. 90 tablet 1  . hydrochlorothiazide (HYDRODIURIL) 25 MG tablet Take 1 tablet (25 mg total) by mouth daily at 6 (six) AM. 90 tablet 1  . isosorbide mononitrate (IMDUR) 30 MG 24 hr tablet Take 1 tablet (30 mg total) by mouth daily at 6 (six) AM. 90 tablet 1  . lisinopril (PRINIVIL,ZESTRIL) 5 MG tablet Take 1 tablet (5 mg total) by mouth daily. 90 tablet 1  . lovastatin (MEVACOR) 20 MG tablet Take 1 tablet (20 mg total) by mouth daily at 6 (six) AM. 30 tablet 0  . metoprolol succinate (TOPROL-XL) 25 MG 24 hr tablet Take 1 tablet (25 mg total) by mouth daily at 6 (six) AM. 30 tablet 0  . nitroGLYCERIN (NITRODUR - DOSED IN MG/24 HR) 0.2 mg/hr patch UNWRAP AND APPLY 1 PATCH ONCE DAILY, LEAVE ON FOR 12-14 HOURS(WAIT 10-12 HOURS BEFORE NEXT PATCH) 90 patch 0   No facility-administered medications prior to visit.     Review of Systems  Constitutional: Negative for chills, fatigue, fever, malaise/fatigue and weight loss.  HENT: Negative for ear discharge, ear pain and sore throat.   Eyes: Negative for blurred vision.  Respiratory: Negative for cough, sputum production,  shortness of breath and wheezing.   Cardiovascular: Negative for chest pain, palpitations, orthopnea, leg swelling and PND.  Gastrointestinal: Negative for abdominal pain, blood in stool, constipation, diarrhea, heartburn, melena and nausea.  Genitourinary: Negative for dysuria, frequency, hematuria and urgency.  Musculoskeletal: Negative for back pain, joint pain, myalgias and neck pain.  Skin: Negative for pallor and rash.  Neurological: Negative for dizziness, tingling, tremors, sensory change, focal weakness, seizures, speech difficulty, weakness and headaches.  Endo/Heme/Allergies: Negative for environmental allergies, polydipsia and polyphagia. Does not bruise/bleed easily.  Psychiatric/Behavioral: Negative for confusion, depression and suicidal ideas. The patient is not nervous/anxious and does not have insomnia.      Objective  Vitals:   01/30/17 1018  BP: 128/68  Pulse: 63  SpO2: 96%  Weight: 196 lb (88.9 kg)  Height:  (1.803 m)    Physical Exam  Constitutional: He is oriented to person, place, and time and well-developed, well-nourished, and in no distress.  HENT:  Head: Normocephalic.  Right Ear: External  ear normal.  Left Ear: External ear normal.  Nose: Nose normal.  Mouth/Throat: Oropharynx is clear and moist.  Eyes: Pupils are equal, round, and reactive to light. Conjunctivae and EOM are normal. Right eye exhibits no discharge. Left eye exhibits no discharge. No scleral icterus.  Neck: Normal range of motion. Neck supple. No JVD present. No tracheal deviation present. No thyromegaly present.  Cardiovascular: Normal rate, regular rhythm, normal heart sounds and intact distal pulses.  Exam reveals no gallop and no friction rub.   No murmur heard. Pulmonary/Chest: Breath sounds normal. No respiratory distress. He has no wheezes. He has no rales.  Abdominal: Soft. Bowel sounds are normal. He exhibits no mass. There is no hepatosplenomegaly. There is no tenderness.  There is no rebound, no guarding and no CVA tenderness.  Musculoskeletal: Normal range of motion. He exhibits no edema or tenderness.  Lymphadenopathy:    He has no cervical adenopathy.  Neurological: He is alert and oriented to person, place, and time. He has normal sensation, normal strength, normal reflexes and intact cranial nerves. No cranial nerve deficit.  Skin: Skin is warm. No rash noted.  Psychiatric: Mood and affect normal.  Nursing note and vitals reviewed.     Assessment & Plan  Problem List Items Addressed This Visit      Cardiovascular and Mediastinum   Arteriosclerosis of coronary artery   Relevant Medications   metoprolol succinate (TOPROL-XL) 25 MG 24 hr tablet   clopidogrel (PLAVIX) 75 MG tablet   nitroGLYCERIN (NITRODUR - DOSED IN MG/24 HR) 0.2 mg/hr patch   amLODipine (NORVASC) 10 MG tablet   hydrochlorothiazide (HYDRODIURIL) 25 MG tablet   isosorbide mononitrate (IMDUR) 30 MG 24 hr tablet   lisinopril (PRINIVIL,ZESTRIL) 5 MG tablet   lovastatin (MEVACOR) 20 MG tablet   BP (high blood pressure) - Primary   Relevant Medications   metoprolol succinate (TOPROL-XL) 25 MG 24 hr tablet   nitroGLYCERIN (NITRODUR - DOSED IN MG/24 HR) 0.2 mg/hr patch   amLODipine (NORVASC) 10 MG tablet   hydrochlorothiazide (HYDRODIURIL) 25 MG tablet   isosorbide mononitrate (IMDUR) 30 MG 24 hr tablet   lisinopril (PRINIVIL,ZESTRIL) 5 MG tablet   lovastatin (MEVACOR) 20 MG tablet   Other Relevant Orders   Renal Function Panel     Endocrine   Type 2 diabetes mellitus without complication, without long-term current use of insulin (HCC)   Relevant Medications   lisinopril (PRINIVIL,ZESTRIL) 5 MG tablet   lovastatin (MEVACOR) 20 MG tablet     Other   Dyslipidemia   Relevant Medications   lovastatin (MEVACOR) 20 MG tablet   Other Relevant Orders   Lipid Profile      Meds ordered this encounter  Medications  . DISCONTD: lisinopril (PRINIVIL,ZESTRIL) 5 MG tablet    Sig:  Take 1 tablet (5 mg total) by mouth daily.    Dispense:  90 tablet    Refill:  1  . DISCONTD: hydrochlorothiazide (HYDRODIURIL) 25 MG tablet    Sig: Take 1 tablet (25 mg total) by mouth daily at 6 (six) AM.    Dispense:  90 tablet    Refill:  1  . metoprolol succinate (TOPROL-XL) 25 MG 24 hr tablet    Sig: Take 1 tablet (25 mg total) by mouth daily at 6 (six) AM.    Dispense:  30 tablet    Refill:  0  . DISCONTD: amLODipine (NORVASC) 10 MG tablet    Sig: Take 1 tablet (10 mg total) by mouth daily at  6 (six) AM.    Dispense:  90 tablet    Refill:  1  . DISCONTD: lovastatin (MEVACOR) 20 MG tablet    Sig: Take 1 tablet (20 mg total) by mouth daily at 6 (six) AM.    Dispense:  30 tablet    Refill:  0  . clopidogrel (PLAVIX) 75 MG tablet    Sig: Take 1 tablet (75 mg total) by mouth daily at 6 (six) AM.    Dispense:  90 tablet    Refill:  1  . nitroGLYCERIN (NITRODUR - DOSED IN MG/24 HR) 0.2 mg/hr patch    Sig: UNWRAP AND APPLY 1 PATCH ONCE DAILY, LEAVE ON FOR 12-14 HOURS(WAIT 10-12 HOURS BEFORE NEXT PATCH)    Dispense:  90 patch    Refill:  0    **Patient requests 90 days supply**  . amLODipine (NORVASC) 10 MG tablet    Sig: Take 1 tablet (10 mg total) by mouth daily at 6 (six) AM.    Dispense:  90 tablet    Refill:  1  . hydrochlorothiazide (HYDRODIURIL) 25 MG tablet    Sig: Take 1 tablet (25 mg total) by mouth daily at 6 (six) AM.    Dispense:  90 tablet    Refill:  1  . isosorbide mononitrate (IMDUR) 30 MG 24 hr tablet    Sig: Take 1 tablet (30 mg total) by mouth daily at 6 (six) AM.    Dispense:  90 tablet    Refill:  1  . lisinopril (PRINIVIL,ZESTRIL) 5 MG tablet    Sig: Take 1 tablet (5 mg total) by mouth daily.    Dispense:  90 tablet    Refill:  1  . lovastatin (MEVACOR) 20 MG tablet    Sig: Take 1 tablet (20 mg total) by mouth daily at 6 (six) AM.    Dispense:  90 tablet    Refill:  1      Dr. [facility]  01/30/17

## 2017-03-16 ENCOUNTER — Ambulatory Visit
Admission: EM | Admit: 2017-03-16 | Discharge: 2017-03-16 | Disposition: A | Discharge status: Home / Self Care | Payer: Medicare Other | Attending: Family Medicine | Admitting: Family Medicine

## 2017-03-16 ENCOUNTER — Encounter: Payer: Self-pay | Admitting: *Deleted

## 2017-03-16 ENCOUNTER — Ambulatory Visit (INDEPENDENT_AMBULATORY_CARE_PROVIDER_SITE_OTHER): Payer: Medicare Other

## 2017-03-16 DIAGNOSIS — S52201A Unspecified fracture of shaft of right ulna, initial encounter for closed fracture: Secondary | ICD-10-CM | POA: Diagnosis not present

## 2017-03-16 DIAGNOSIS — M25531 Pain in right wrist: Secondary | ICD-10-CM | POA: Diagnosis not present

## 2017-03-16 DIAGNOSIS — S6991XA Unspecified injury of right wrist, hand and finger(s), initial encounter: Secondary | ICD-10-CM | POA: Diagnosis not present

## 2017-03-16 NOTE — Discharge Instructions (Signed)
Call [facility] for [facility] your [facility]. Number is listed.  Take care  Dr. Adriana Simasook
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
## 2017-03-19 DIAGNOSIS — S52601A Unspecified fracture of lower end of right ulna, initial encounter for closed fracture: Secondary | ICD-10-CM | POA: Diagnosis not present

## 2017-03-19 DIAGNOSIS — E1142 Type 2 diabetes mellitus with diabetic polyneuropathy: Secondary | ICD-10-CM | POA: Diagnosis not present

## 2017-04-03 DIAGNOSIS — S52601D Unspecified fracture of lower end of right ulna, subsequent encounter for closed fracture with routine healing: Secondary | ICD-10-CM | POA: Diagnosis not present

## 2017-04-03 DIAGNOSIS — E1142 Type 2 diabetes mellitus with diabetic polyneuropathy: Secondary | ICD-10-CM | POA: Diagnosis not present

## 2017-04-03 DIAGNOSIS — M25531 Pain in right wrist: Secondary | ICD-10-CM | POA: Diagnosis not present

## 2017-04-22 ENCOUNTER — Other Ambulatory Visit: Payer: Self-pay

## 2017-04-23 ENCOUNTER — Ambulatory Visit (INDEPENDENT_AMBULATORY_CARE_PROVIDER_SITE_OTHER): Payer: Medicare Other | Admitting: Family Medicine

## 2017-04-23 ENCOUNTER — Encounter: Payer: Self-pay | Admitting: Family Medicine

## 2017-04-23 VITALS — BP 130/80 | HR 80 | Ht 71.0 in | Wt 202.0 lb

## 2017-04-23 DIAGNOSIS — I25709 Atherosclerosis of coronary artery bypass graft(s), unspecified, with unspecified angina pectoris: Secondary | ICD-10-CM

## 2017-04-23 DIAGNOSIS — I200 Unstable angina: Secondary | ICD-10-CM

## 2017-04-23 DIAGNOSIS — E119 Type 2 diabetes mellitus without complications: Secondary | ICD-10-CM | POA: Diagnosis not present

## 2017-04-23 NOTE — Progress Notes (Signed)
Name: Kenneth Mitchell   MRN: [id number]    DOB: 01/31/1934   Date:04/23/2017       Progress Note  Subjective  Chief Complaint  Chief Complaint  Patient presents with  . Shortness of Breath    wakes up in the middle of night- "hurts in throat"- episodes last approx 5-10 minutes. Sitting up helps it.     Chest Pain   This is a recurrent problem. The current episode started more than 1 month ago (6-8 month). The onset quality is gradual. The problem occurs daily. The problem has been waxing and waning. The pain is present in the substernal region. The pain is moderate. The quality of the pain is described as tightness. The pain radiates to the left neck, right arm, left arm and upper back. Associated symptoms include exertional chest pressure, malaise/fatigue and shortness of breath. Pertinent negatives include no abdominal pain, back pain, cough, diaphoresis, dizziness, fever, headaches, irregular heartbeat, nausea, near-syncope, numbness, palpitations, sputum production or syncope. Treatments tried: forgot nitro. The treatment provided no relief.    No problem-specific Assessment & Plan notes found for this encounter.   Past Medical History:  Diagnosis Date  . CAD (coronary artery disease)   . Diabetes mellitus (HCC)   . Gross hematuria   . Hyperlipidemia   . Hypertension   . Inguinal hernia   . Nodular prostate   . Syncope     Past Surgical History:  Procedure Laterality Date  . aneurysm surgery     x 2  . quadrupal bypass      Family History  Problem Relation Age of Onset  . Diabetes Mother   . Heart disease Father   . Stroke Father   . Cancer Brother   . Kidney disease Neg Hx   . Prostate cancer Neg Hx     Social History   Socioeconomic History  . Marital status: Married    Spouse name: Not on file  . Number of children: Not on file  . Years of education: Not on file  . Highest education level: Not on file  Social Needs  . Financial resource strain: Not on file   . Food insecurity - worry: Not on file  . Food insecurity - inability: Not on file  . Transportation needs - medical: Not on file  . Transportation needs - non-medical: Not on file  Occupational History  . Not on file  Tobacco Use  . Smoking status: Former Games developermoker  . Smokeless tobacco: Never Used  . Tobacco comment: quit 20 years  Substance and Sexual Activity  . Alcohol use: No    Alcohol/week: 0.0 oz  . Drug use: No  . Sexual activity: No  Other Topics Concern  . Not on file  Social History Narrative  . Not on file    No Known Allergies  Outpatient Medications Prior to Visit  Medication Sig Dispense Refill  . amLODipine (NORVASC) 10 MG tablet Take 1 tablet (10 mg total) by mouth daily at 6 (six) AM. 90 tablet 1  . aspirin 81 MG tablet Take 81 mg by mouth daily.    . clopidogrel (PLAVIX) 75 MG tablet Take 1 tablet (75 mg total) by mouth daily at 6 (six) AM. 90 tablet 1  . glipiZIDE (GLUCOTROL) 5 MG tablet Take 5 mg by mouth 2 (two) times daily before a meal. Dr Deloria LairAbby    . hydrochlorothiazide (HYDRODIURIL) 25 MG tablet Take 1 tablet (25 mg total) by mouth daily at 6 (  six) AM. 90 tablet 1  . isosorbide mononitrate (IMDUR) 30 MG 24 hr tablet Take 1 tablet (30 mg total) by mouth daily at 6 (six) AM. 90 tablet 1  . lisinopril (PRINIVIL,ZESTRIL) 5 MG tablet Take 1 tablet (5 mg total) by mouth daily. 90 tablet 1  . lovastatin (MEVACOR) 20 MG tablet Take 1 tablet (20 mg total) by mouth daily at 6 (six) AM. 90 tablet 1  . metFORMIN (GLUCOPHAGE) 500 MG tablet Take 1 tablet (500 mg total) by mouth 2 (two) times daily with a meal. (Patient taking differently: Take 500 mg by mouth 2 (two) times daily with a meal. Dr Deloria Lair) 60 tablet 0  . metoprolol succinate (TOPROL-XL) 25 MG 24 hr tablet Take 1 tablet (25 mg total) by mouth daily at 6 (six) AM. 30 tablet 0  . nitroGLYCERIN (NITRODUR - DOSED IN MG/24 HR) 0.2 mg/hr patch UNWRAP AND APPLY 1 PATCH ONCE DAILY, LEAVE ON FOR 12-14 HOURS(WAIT 10-12  HOURS BEFORE NEXT PATCH) 90 patch 0   No facility-administered medications prior to visit.     Review of Systems  Constitutional: Positive for malaise/fatigue. Negative for chills, diaphoresis, fever and weight loss.  HENT: Negative for ear discharge, ear pain and sore throat.   Eyes: Negative for blurred vision.  Respiratory: Positive for shortness of breath. Negative for cough, sputum production and wheezing.   Cardiovascular: Positive for chest pain. Negative for palpitations, leg swelling, syncope and near-syncope.  Gastrointestinal: Negative for abdominal pain, blood in stool, constipation, diarrhea, heartburn, melena and nausea.  Genitourinary: Negative for dysuria, frequency, hematuria and urgency.  Musculoskeletal: Negative for back pain, joint pain, myalgias and neck pain.  Skin: Negative for rash.  Neurological: Negative for dizziness, tingling, sensory change, focal weakness, numbness and headaches.  Endo/Heme/Allergies: Negative for environmental allergies and polydipsia. Does not bruise/bleed easily.  Psychiatric/Behavioral: Negative for depression and suicidal ideas. The patient is not nervous/anxious and does not have insomnia.      Objective  Vitals:   04/23/17 0957  BP: 130/80  Pulse: 80  Weight: 202 lb (91.6 kg)  Height: 5\' 11"  (1.803 m)    Physical Exam  Constitutional: He is oriented to person, place, and time and well-developed, well-nourished, and in no distress.  HENT:  Head: Normocephalic.  Right Ear: External ear normal.  Left Ear: External ear normal.  Nose: Nose normal.  Mouth/Throat: Oropharynx is clear and moist.  Eyes: Conjunctivae and EOM are normal. Pupils are equal, round, and reactive to light. Right eye exhibits no discharge. Left eye exhibits no discharge. No scleral icterus.  Neck: Normal range of motion. Neck supple. No JVD present. No tracheal deviation present. No thyromegaly present.  Cardiovascular: Normal rate, regular rhythm, normal  heart sounds and intact distal pulses. Exam reveals no gallop and no friction rub.  No murmur heard. Pulmonary/Chest: Breath sounds normal. No respiratory distress. He has no wheezes. He has no rales.  Abdominal: Soft. Bowel sounds are normal. He exhibits no mass. There is no hepatosplenomegaly. There is no tenderness. There is no rebound, no guarding and no CVA tenderness.  Musculoskeletal: Normal range of motion. He exhibits no edema or tenderness.  Lymphadenopathy:    He has no cervical adenopathy.  Neurological: He is alert and oriented to person, place, and time. He has normal sensation, normal strength, normal reflexes and intact cranial nerves. No cranial nerve deficit.  Skin: Skin is warm. No rash noted.  Psychiatric: Mood and affect normal.  Nursing note and vitals reviewed.  Assessment & Plan  Problem List Items Addressed This Visit      Endocrine   Type 2 diabetes mellitus without complication, without long-term current use of insulin (HCC)    Other Visit Diagnoses    Unstable angina pectoris (HCC)    -  Primary   Relevant Orders   EKG 12-Lead (Completed)   Ambulatory referral to Cardiology   Atherosclerosis of coronary artery bypass graft of native heart with angina pectoris (HCC)       Relevant Orders   EKG 12-Lead (Completed)   Ambulatory referral to Cardiology      No orders of the defined types were placed in this encounter.     Dr. [facility]  04/23/17

## 2017-04-27 ENCOUNTER — Other Ambulatory Visit: Payer: Self-pay

## 2017-04-29 DIAGNOSIS — E1142 Type 2 diabetes mellitus with diabetic polyneuropathy: Secondary | ICD-10-CM | POA: Diagnosis not present

## 2017-04-29 DIAGNOSIS — S52601D Unspecified fracture of lower end of right ulna, subsequent encounter for closed fracture with routine healing: Secondary | ICD-10-CM | POA: Diagnosis not present

## 2017-04-29 DIAGNOSIS — M25531 Pain in right wrist: Secondary | ICD-10-CM | POA: Diagnosis not present

## 2017-05-06 DIAGNOSIS — E785 Hyperlipidemia, unspecified: Secondary | ICD-10-CM | POA: Diagnosis not present

## 2017-05-06 DIAGNOSIS — R079 Chest pain, unspecified: Secondary | ICD-10-CM | POA: Diagnosis not present

## 2017-05-06 DIAGNOSIS — I6523 Occlusion and stenosis of bilateral carotid arteries: Secondary | ICD-10-CM | POA: Diagnosis not present

## 2017-05-06 DIAGNOSIS — I2510 Atherosclerotic heart disease of native coronary artery without angina pectoris: Secondary | ICD-10-CM | POA: Diagnosis not present

## 2017-05-06 DIAGNOSIS — R0602 Shortness of breath: Secondary | ICD-10-CM | POA: Diagnosis not present

## 2017-05-06 DIAGNOSIS — E1169 Type 2 diabetes mellitus with other specified complication: Secondary | ICD-10-CM | POA: Diagnosis not present

## 2017-05-06 DIAGNOSIS — R072 Precordial pain: Secondary | ICD-10-CM | POA: Diagnosis not present

## 2017-05-08 DIAGNOSIS — I509 Heart failure, unspecified: Secondary | ICD-10-CM | POA: Diagnosis not present

## 2017-05-08 DIAGNOSIS — E119 Type 2 diabetes mellitus without complications: Secondary | ICD-10-CM | POA: Diagnosis not present

## 2017-05-08 DIAGNOSIS — I10 Essential (primary) hypertension: Secondary | ICD-10-CM | POA: Diagnosis not present

## 2017-05-08 DIAGNOSIS — I2582 Chronic total occlusion of coronary artery: Secondary | ICD-10-CM | POA: Diagnosis not present

## 2017-05-08 DIAGNOSIS — I110 Hypertensive heart disease with heart failure: Secondary | ICD-10-CM | POA: Diagnosis not present

## 2017-05-08 DIAGNOSIS — Z951 Presence of aortocoronary bypass graft: Secondary | ICD-10-CM | POA: Diagnosis not present

## 2017-05-08 DIAGNOSIS — R0602 Shortness of breath: Secondary | ICD-10-CM | POA: Diagnosis not present

## 2017-05-08 DIAGNOSIS — R072 Precordial pain: Secondary | ICD-10-CM | POA: Diagnosis not present

## 2017-05-08 DIAGNOSIS — I25118 Atherosclerotic heart disease of native coronary artery with other forms of angina pectoris: Secondary | ICD-10-CM | POA: Diagnosis not present

## 2017-05-08 DIAGNOSIS — Z5181 Encounter for therapeutic drug level monitoring: Secondary | ICD-10-CM | POA: Diagnosis not present

## 2017-05-08 DIAGNOSIS — E785 Hyperlipidemia, unspecified: Secondary | ICD-10-CM | POA: Diagnosis not present

## 2017-05-08 DIAGNOSIS — T82857A Stenosis of cardiac prosthetic devices, implants and grafts, initial encounter: Secondary | ICD-10-CM | POA: Diagnosis not present

## 2017-05-08 DIAGNOSIS — I25718 Atherosclerosis of autologous vein coronary artery bypass graft(s) with other forms of angina pectoris: Secondary | ICD-10-CM | POA: Diagnosis not present

## 2017-05-08 DIAGNOSIS — I25728 Atherosclerosis of autologous artery coronary artery bypass graft(s) with other forms of angina pectoris: Secondary | ICD-10-CM | POA: Diagnosis not present

## 2017-05-12 ENCOUNTER — Other Ambulatory Visit: Payer: Self-pay

## 2017-05-12 ENCOUNTER — Telehealth: Payer: Self-pay

## 2017-05-12 NOTE — Telephone Encounter (Signed)
Called in today "[facility] didn't do anything and I'm even worse now. The only thing they said was they can't stent me." He wanted Jones to call Allena Katzatel and see what was going on. I spoke with Yetta BarreJones and she told me to advise [facility], given that he was worse with being SOB. I spoke to him about the possibility of PE, needed to be ruled out as well. He refused to go unless we speak to RobertsPatel. I put a call in and got the male nurse on line for Jones to speak to. She was given the cath report verbally while on phone. I contacted pt and told him there is disease there and the idea was to try meds first and come back if not better. Pt says no med was called in, so I asked him if he called pharmacy to check. He said no, I told him while he was waiting on call from Patel's office, to check with pharmacy. The nurse said that they would put a call into the pt today.

## 2017-05-14 DIAGNOSIS — S301XXA Contusion of abdominal wall, initial encounter: Secondary | ICD-10-CM | POA: Diagnosis not present

## 2017-05-14 DIAGNOSIS — I2510 Atherosclerotic heart disease of native coronary artery without angina pectoris: Secondary | ICD-10-CM | POA: Diagnosis not present

## 2017-05-14 DIAGNOSIS — I10 Essential (primary) hypertension: Secondary | ICD-10-CM | POA: Diagnosis not present

## 2017-05-14 DIAGNOSIS — E785 Hyperlipidemia, unspecified: Secondary | ICD-10-CM | POA: Diagnosis not present

## 2017-05-14 DIAGNOSIS — Y33XXXA Other specified events, undetermined intent, initial encounter: Secondary | ICD-10-CM | POA: Diagnosis not present

## 2017-05-14 DIAGNOSIS — M799 Soft tissue disorder, unspecified: Secondary | ICD-10-CM | POA: Diagnosis not present

## 2017-06-26 DIAGNOSIS — R809 Proteinuria, unspecified: Secondary | ICD-10-CM | POA: Diagnosis not present

## 2017-06-26 DIAGNOSIS — E1159 Type 2 diabetes mellitus with other circulatory complications: Secondary | ICD-10-CM | POA: Diagnosis not present

## 2017-06-26 DIAGNOSIS — E1129 Type 2 diabetes mellitus with other diabetic kidney complication: Secondary | ICD-10-CM | POA: Diagnosis not present

## 2017-06-26 DIAGNOSIS — E785 Hyperlipidemia, unspecified: Secondary | ICD-10-CM | POA: Diagnosis not present

## 2017-06-26 DIAGNOSIS — E1169 Type 2 diabetes mellitus with other specified complication: Secondary | ICD-10-CM | POA: Diagnosis not present

## 2017-08-31 ENCOUNTER — Telehealth: Payer: Self-pay

## 2017-08-31 NOTE — Telephone Encounter (Signed)
Called to schedule AWV but pt refused to schedule. States he "has everything he needs" and "doesn't need to be seen by her nurse for some wellness stuff". Pt then hung up phone.

## 2017-10-05 ENCOUNTER — Emergency Department: Payer: Medicare Other

## 2017-10-05 ENCOUNTER — Observation Stay
Admission: EM | Admit: 2017-10-05 | Discharge: 2017-10-06 | Discharge status: Against Medical Advice | Payer: Medicare Other | Attending: Internal Medicine | Admitting: Internal Medicine

## 2017-10-05 ENCOUNTER — Other Ambulatory Visit: Payer: Self-pay

## 2017-10-05 DIAGNOSIS — Z87891 Personal history of nicotine dependence: Secondary | ICD-10-CM | POA: Insufficient documentation

## 2017-10-05 DIAGNOSIS — E785 Hyperlipidemia, unspecified: Secondary | ICD-10-CM | POA: Diagnosis not present

## 2017-10-05 DIAGNOSIS — E119 Type 2 diabetes mellitus without complications: Secondary | ICD-10-CM | POA: Insufficient documentation

## 2017-10-05 DIAGNOSIS — I2510 Atherosclerotic heart disease of native coronary artery without angina pectoris: Secondary | ICD-10-CM | POA: Diagnosis not present

## 2017-10-05 DIAGNOSIS — Z9114 Patient's other noncompliance with medication regimen: Secondary | ICD-10-CM | POA: Insufficient documentation

## 2017-10-05 DIAGNOSIS — T82330A Leakage of aortic (bifurcation) graft (replacement), initial encounter: Secondary | ICD-10-CM | POA: Insufficient documentation

## 2017-10-05 DIAGNOSIS — Y838 Other surgical procedures as the cause of abnormal reaction of the patient, or of later complication, without mention of misadventure at the time of the procedure: Secondary | ICD-10-CM | POA: Diagnosis not present

## 2017-10-05 DIAGNOSIS — Z7982 Long term (current) use of aspirin: Secondary | ICD-10-CM | POA: Diagnosis not present

## 2017-10-05 DIAGNOSIS — R1084 Generalized abdominal pain: Principal | ICD-10-CM | POA: Insufficient documentation

## 2017-10-05 DIAGNOSIS — D696 Thrombocytopenia, unspecified: Secondary | ICD-10-CM | POA: Insufficient documentation

## 2017-10-05 DIAGNOSIS — Z7984 Long term (current) use of oral hypoglycemic drugs: Secondary | ICD-10-CM | POA: Insufficient documentation

## 2017-10-05 DIAGNOSIS — I10 Essential (primary) hypertension: Secondary | ICD-10-CM | POA: Insufficient documentation

## 2017-10-05 DIAGNOSIS — Z66 Do not resuscitate: Secondary | ICD-10-CM | POA: Diagnosis not present

## 2017-10-05 DIAGNOSIS — K760 Fatty (change of) liver, not elsewhere classified: Secondary | ICD-10-CM | POA: Diagnosis not present

## 2017-10-05 DIAGNOSIS — Z79899 Other long term (current) drug therapy: Secondary | ICD-10-CM | POA: Insufficient documentation

## 2017-10-05 DIAGNOSIS — E875 Hyperkalemia: Secondary | ICD-10-CM | POA: Insufficient documentation

## 2017-10-05 DIAGNOSIS — E876 Hypokalemia: Secondary | ICD-10-CM | POA: Diagnosis not present

## 2017-10-05 DIAGNOSIS — R109 Unspecified abdominal pain: Secondary | ICD-10-CM | POA: Diagnosis not present

## 2017-10-05 DIAGNOSIS — Z7902 Long term (current) use of antithrombotics/antiplatelets: Secondary | ICD-10-CM | POA: Diagnosis not present

## 2017-10-05 DIAGNOSIS — Z951 Presence of aortocoronary bypass graft: Secondary | ICD-10-CM | POA: Diagnosis not present

## 2017-10-05 LAB — URINALYSIS, COMPLETE (UACMP) WITH MICROSCOPIC
BILIRUBIN URINE: NEGATIVE
Bacteria, UA: NONE SEEN
Glucose, UA: NEGATIVE mg/dL
KETONES UR: NEGATIVE mg/dL
Leukocytes, UA: NEGATIVE
NITRITE: NEGATIVE
PROTEIN: 100 mg/dL — AB
Specific Gravity, Urine: 1.02 (ref 1.005–1.030)
pH: 6.5 (ref 5.0–8.0)

## 2017-10-05 LAB — COMPREHENSIVE METABOLIC PANEL
ALBUMIN: 4.2 g/dL (ref 3.5–5.0)
ALT: 16 U/L — ABNORMAL LOW (ref 17–63)
AST: 34 U/L (ref 15–41)
Alkaline Phosphatase: 43 U/L (ref 38–126)
Anion gap: 11 (ref 5–15)
BUN: 19 mg/dL (ref 6–20)
CHLORIDE: 106 mmol/L (ref 101–111)
CO2: 18 mmol/L — AB (ref 22–32)
Calcium: 8.6 mg/dL — ABNORMAL LOW (ref 8.9–10.3)
Creatinine, Ser: 1.21 mg/dL (ref 0.61–1.24)
GFR calc Af Amer: >60 mL/min (ref >60)
GFR, EST NON AFRICAN AMERICAN: 54 mL/min — AB (ref >60)
GLUCOSE: 167 mg/dL — AB (ref 65–99)
POTASSIUM: 5.3 mmol/L — AB (ref 3.5–5.1)
SODIUM: 135 mmol/L (ref 135–145)
Total Bilirubin: 1.7 mg/dL — ABNORMAL HIGH (ref 0.3–1.2)
Total Protein: 6.9 g/dL (ref 6.5–8.1)

## 2017-10-05 LAB — CBC
HCT: 46.7 % (ref 40.0–52.0)
Hemoglobin: 15.9 g/dL (ref 13.0–18.0)
MCH: 30.4 pg (ref 26.0–34.0)
MCHC: 34 g/dL (ref 32.0–36.0)
MCV: 89.5 fL (ref 80.0–100.0)
PLATELETS: 97 10*3/uL — AB (ref 150–440)
RBC: 5.22 MIL/uL (ref 4.40–5.90)
RDW: 15.2 % — AB (ref 11.5–14.5)
WBC: 5 10*3/uL (ref 3.8–10.6)

## 2017-10-05 LAB — LIPASE, BLOOD: Lipase: 47 U/L (ref 11–51)

## 2017-10-05 MED ORDER — IOHEXOL 300 MG/ML  SOLN
100.0000 mL | Freq: Once | INTRAMUSCULAR | Status: AC | PRN
  Administered 2017-10-05: 100 mL via INTRAVENOUS

## 2017-10-05 MED ORDER — ISOSORBIDE MONONITRATE ER 30 MG PO TB24: 30.0000 mg | ORAL_TABLET | Freq: Every day | ORAL | Status: DC

## 2017-10-05 MED ORDER — MORPHINE SULFATE (PF) 2 MG/ML IV SOLN
2.0000 mg | INTRAVENOUS | Status: DC | PRN
Start: 2017-10-05 — End: 2017-10-06

## 2017-10-05 MED ORDER — GLIPIZIDE 5 MG PO TABS
5.0000 mg | ORAL_TABLET | Freq: Two times a day (BID) | ORAL | Status: DC
  Filled 2017-10-05 (×2): qty 1

## 2017-10-05 MED ORDER — METOPROLOL SUCCINATE ER 25 MG PO TB24: 25.0000 mg | ORAL_TABLET | Freq: Every day | ORAL | Status: DC

## 2017-10-05 MED ORDER — MORPHINE SULFATE (PF) 4 MG/ML IV SOLN
4.0000 mg | Freq: Once | INTRAVENOUS | Status: AC
  Administered 2017-10-05: 4 mg via INTRAVENOUS
  Filled 2017-10-05: qty 1

## 2017-10-05 MED ORDER — IOPAMIDOL (ISOVUE-300) INJECTION 61%
30.0000 mL | Freq: Once | INTRAVENOUS | Status: AC | PRN
  Administered 2017-10-05: 30 mL via ORAL

## 2017-10-05 MED ORDER — ACETAMINOPHEN 325 MG PO TABS: 650.0000 mg | ORAL_TABLET | Freq: Four times a day (QID) | ORAL | Status: DC | PRN

## 2017-10-05 MED ORDER — ACETAMINOPHEN 650 MG RE SUPP: 650.0000 mg | Freq: Four times a day (QID) | RECTAL | Status: DC | PRN

## 2017-10-05 MED ORDER — AMLODIPINE BESYLATE 10 MG PO TABS: 10.0000 mg | ORAL_TABLET | Freq: Every day | ORAL | Status: DC

## 2017-10-05 MED ORDER — ONDANSETRON HCL 4 MG/2ML IJ SOLN
4.0000 mg | Freq: Once | INTRAMUSCULAR | Status: AC
  Administered 2017-10-05: 4 mg via INTRAVENOUS
  Filled 2017-10-05: qty 2

## 2017-10-05 MED ORDER — ONDANSETRON HCL 4 MG PO TABS: 4.0000 mg | ORAL_TABLET | Freq: Four times a day (QID) | ORAL | Status: DC | PRN

## 2017-10-05 MED ORDER — SODIUM POLYSTYRENE SULFONATE 15 GM/60ML PO SUSP
15.0000 g | Freq: Once | ORAL | Status: AC
  Administered 2017-10-05: 15 g via ORAL
  Filled 2017-10-05: qty 60

## 2017-10-05 MED ORDER — PRAVASTATIN SODIUM 20 MG PO TABS: 20.0000 mg | ORAL_TABLET | Freq: Every day | ORAL | Status: DC

## 2017-10-05 MED ORDER — ONDANSETRON HCL 4 MG/2ML IJ SOLN: 4.0000 mg | Freq: Four times a day (QID) | INTRAMUSCULAR | Status: DC | PRN

## 2017-10-05 MED ORDER — NITROGLYCERIN 0.2 MG/HR TD PT24
0.2000 mg | MEDICATED_PATCH | Freq: Every day | TRANSDERMAL | Status: DC
  Filled 2017-10-05: qty 1

## 2017-10-05 MED ORDER — ALUM & MAG HYDROXIDE-SIMETH 200-200-20 MG/5ML PO SUSP: 30.0000 mL | Freq: Four times a day (QID) | ORAL | Status: DC | PRN

## 2017-10-05 NOTE — H&P (Signed)
[facility] [place] at [facility]   PATIENT NAME: Kenneth Mitchell    MR#:  [id number]  DATE OF BIRTH:  [birth year]  DATE OF ADMISSION:  10/05/2017  PRIMARY CARE PHYSICIAN: Duanne Limerick, MD   REQUESTING/REFERRING PHYSICIAN: Cyril Loosen  CHIEF COMPLAINT:   ABD PAIN HISTORY OF PRESENT ILLNESS:  Kenneth Mitchell  is a 84 y.o. male with a known history of coronary artery disease, diabetes metas, hypertension, hyperlipidemia and other medical problems is presenting to the ED today with a chief complaint of right lower quadrant abdominal pain.  Patient denies any nausea vomiting or bloody bowel movements.  CT abdomen has revealed chronic endoleak of the AAA graft.  ED physician has discussed with the\surgery Dr. Gilda Crease who has recommended no interventions at this time.  Hospitalist team is consulted as patient is still having significant abdominal pain.  PAST MEDICAL HISTORY:   Past Medical History:  Diagnosis Date  . CAD (coronary artery disease)   . Diabetes mellitus (HCC)   . Gross hematuria   . Hyperlipidemia   . Hypertension   . Inguinal hernia   . Nodular prostate   . Syncope     PAST SURGICAL HISTOIRY:   Past Surgical History:  Procedure Laterality Date  . aneurysm surgery     x 2  . quadrupal bypass      SOCIAL HISTORY:   Social History   Tobacco Use  . Smoking status: Former Games developer  . Smokeless tobacco: Never Used  . Tobacco comment: quit 20 years  Substance Use Topics  . Alcohol use: No    Alcohol/week: 0.0 oz    FAMILY HISTORY:   Family History  Problem Relation Age of Onset  . Diabetes Mother   . Heart disease Father   . Stroke Father   . Cancer Brother   . Kidney disease Neg Hx   . Prostate cancer Neg Hx     DRUG ALLERGIES:  No Known Allergies  REVIEW OF SYSTEMS:  CONSTITUTIONAL: No fever, fatigue or weakness.  EYES: No blurred or double vision.  EARS, NOSE, AND THROAT: No tinnitus or ear pain.  RESPIRATORY: No cough,  shortness of breath, wheezing or hemoptysis.  CARDIOVASCULAR: No chest pain, orthopnea, edema.  GASTROINTESTINAL: Right lower quadrant abdominal pain no nausea, vomiting, diarrhea or abdominal pain.  GENITOURINARY: No dysuria, hematuria.  ENDOCRINE: No polyuria, nocturia,  HEMATOLOGY: No anemia, easy bruising or bleeding SKIN: No rash or lesion. MUSCULOSKELETAL: No joint pain or arthritis.   NEUROLOGIC: No tingling, numbness, weakness.  PSYCHIATRY: No anxiety or depression.   MEDICATIONS AT HOME:   Prior to Admission medications   Medication Sig Start Date End Date Taking? Authorizing Provider  aspirin 81 MG tablet Take 81 mg by mouth daily.   Yes [provider]  metFORMIN (GLUCOPHAGE) 500 MG tablet Take 1 tablet (500 mg total) by mouth 2 (two) times daily with a meal. Patient taking differently: Take 500 mg by mouth 2 (two) times daily with a meal. Dr Deloria Lair 04/09/15  Yes Duanne Limerick, MD  nitroGLYCERIN (NITRODUR - DOSED IN MG/24 HR) 0.2 mg/hr patch UNWRAP AND APPLY 1 PATCH ONCE DAILY, LEAVE ON FOR 12-14 HOURS(WAIT 10-12 HOURS BEFORE NEXT PATCH) 01/30/17  Yes Duanne Limerick, MD  amLODipine (NORVASC) 10 MG tablet Take 1 tablet (10 mg total) by mouth daily at 6 (six) AM. Patient not taking: Reported on 10/05/2017 01/30/17   Duanne Limerick, MD  clopidogrel (PLAVIX) 75 MG tablet Take 1 tablet (  75 mg total) by mouth daily at 6 (six) AM. Patient not taking: Reported on 10/05/2017 01/30/17   Duanne LimerickJones, Deanna C, MD  glipiZIDE (GLUCOTROL) 5 MG tablet Take 5 mg by mouth 2 (two) times daily before a meal. Dr Deloria LairAbby    [provider]  hydrochlorothiazide (HYDRODIURIL) 25 MG tablet Take 1 tablet (25 mg total) by mouth daily at 6 (six) AM. Patient not taking: Reported on 10/05/2017 01/30/17   Duanne LimerickJones, Deanna C, MD  isosorbide mononitrate (IMDUR) 30 MG 24 hr tablet Take 1 tablet (30 mg total) by mouth daily at 6 (six) AM. Patient not taking: Reported on 10/05/2017 01/30/17   Duanne LimerickJones, Deanna C,  MD  lisinopril (PRINIVIL,ZESTRIL) 5 MG tablet Take 1 tablet (5 mg total) by mouth daily. Patient not taking: Reported on 10/05/2017 01/30/17 01/30/18  Duanne LimerickJones, Deanna C, MD  lovastatin (MEVACOR) 20 MG tablet Take 1 tablet (20 mg total) by mouth daily at 6 (six) AM. Patient not taking: Reported on 10/05/2017 01/30/17   Duanne LimerickJones, Deanna C, MD  metoprolol succinate (TOPROL-XL) 25 MG 24 hr tablet Take 1 tablet (25 mg total) by mouth daily at 6 (six) AM. Patient not taking: Reported on 10/05/2017 01/30/17   Duanne LimerickJones, Deanna C, MD      VITAL SIGNS:  Blood pressure (!) 159/86, pulse 62, temperature (!) 97.5 F (36.4 C), temperature source Oral, resp. rate 18, height 5\' 11"  (1.803 m), weight 90.7 kg (200 lb), SpO2 92 %.  PHYSICAL EXAMINATION:  GENERAL:  84 y.o.-year-old patient lying in the bed with no acute distress.  EYES: Pupils equal, round, reactive to light and accommodation. No scleral icterus. Extraocular muscles intact.  HEENT: Head atraumatic, normocephalic. Oropharynx and nasopharynx clear.  NECK:  Supple, no jugular venous distention. No thyroid enlargement, no tenderness.  LUNGS: Normal breath sounds bilaterally, no wheezing, rales,rhonchi or crepitation. No use of accessory muscles of respiration.  CARDIOVASCULAR: S1, S2 normal. No murmurs, rubs, or gallops.  ABDOMEN: Soft, right lower quadrant of the abdomen is tender, no rebound tenderness nondistended. Bowel sounds present.  EXTREMITIES: No pedal edema, cyanosis, or clubbing.  NEUROLOGIC: Cranial nerves II through XII are intact. Muscle strength 5/5 in all extremities. Sensation intact. Gait not checked.  PSYCHIATRIC: The patient is alert and oriented x 3.  SKIN: No obvious rash, lesion, or ulcer.   LABORATORY PANEL:   CBC Recent Labs  Lab 10/05/17 0732  WBC 5.0  HGB 15.9  HCT 46.7  PLT 97*   ------------------------------------------------------------------------------------------------------------------  Chemistries  Recent  Labs  Lab 10/05/17 0732  NA 135  K 5.3*  CL 106  CO2 18*  GLUCOSE 167*  BUN 19  CREATININE 1.21  CALCIUM 8.6*  AST 34  ALT 16*  ALKPHOS 43  BILITOT 1.7*   ------------------------------------------------------------------------------------------------------------------  Cardiac Enzymes No results for input(s): TROPONINI in the last 168 hours. ------------------------------------------------------------------------------------------------------------------  RADIOLOGY:  Ct Abdomen Pelvis W Contrast  Result Date: 10/05/2017 CLINICAL DATA:  84 year old male with acute abdominal and pelvic pain. EXAM: CT ABDOMEN AND PELVIS WITH CONTRAST TECHNIQUE: Multidetector CT imaging of the abdomen and pelvis was performed using the standard protocol following bolus administration of intravenous contrast. CONTRAST:  100 cc intravenous Omnipaque 300 COMPARISON:  04/04/2015 prior CTs FINDINGS: Lower chest: No acute abnormality. Cardiomegaly and cardiac surgical changes identified. Hepatobiliary: Hepatic steatosis and a RIGHT hepatic cyst again identified. The gallbladder is unremarkable. No biliary dilatation. Pancreas: Unremarkable Spleen: Unremarkable Adrenals/Urinary Tract: The RIGHT kidney is severely atrophic. There is no evidence of hydronephrosis or  renal mass. The adrenal glands and bladder are unremarkable. Stomach/Bowel: Stomach is within normal limits. Appendix appears normal. No evidence of bowel wall thickening, distention, or inflammatory changes. Colonic diverticulosis noted without evidence of diverticulitis. Vascular/Lymphatic: Abdominal aortic aneurysm and aorto bi-iliac stent graft again noted. Possible type 2 endoleak posteriorly (series 4: Image 44) noted. The native aneurysm sac measures up to 4.5 cm and unchanged from 2016. There is enlargement of a pseudoaneurysm extending off of the anterior RIGHT aspect of the abdominal aorta just above the stent graft measuring 2.7 cm (series 4:32),  previously measuring 2 cm on 04/04/2015. No adjacent hemorrhage is identified. A 3 cm pseudoaneurysm extending off of the RIGHT internal iliac artery (series 4:69) previously measured 2.5 cm in 2016. Atherosclerotic calcification within the renal and mesenteric arteries noted, which are grossly patent. Reproductive: Marked prostate enlargement again noted. Other: No ascites, focal collection, acute hemorrhage or pneumoperitoneum. Moderate to large bilateral inguinal hernias containing fat are present. Musculoskeletal: No acute or suspicious bony abnormalities. IMPRESSION: 1. Abdominal aortic aneurysm and aorto bi-iliac stent graft again noted with possible type 2 endoleak. No significant change in native aneurysm sac size (4.5 cm) since 2016. 2. Enlarging pseudoaneurysms since 2016 extending off of the anterior RIGHT abdominal aorta above the stent graft (now 2.7 cm, previously 2 cm) and extending off of the RIGHT internal iliac artery (now 3 cm, previously 2.5 cm). No evidence of adjacent hemorrhage/hematoma. 3. Hepatic steatosis. 4. Cardiomegaly 5. Marked prostate enlargement 6. Atrophic RIGHT kidney 7. Moderate to large bilateral inguinal hernias containing fat. Electronically Signed   By: Harmon Pier M.D.   On: 10/05/2017 12:09    EKG:   Orders placed or performed in visit on 04/23/17  . EKG 12-Lead    IMPRESSION AND PLAN:     #Acute abdominal pain-unclear etiology Admit in observation and pain management as needed Vascular surgery consulted-as CT has revealed type II endoleak from the AAA graft , as per the ED physicians discussion no interventions needed at this time  #Hyperkalemia Kayexalate and recheck  #Thrombocytopenia Platelet count at 97,000 Hold aspirin Patient stopped taking Plavix  #Diabetes mellitus sliding scale insulin and continue glipizide  #Hypertension blood pressure is elevated Patient stopped taking his blood pressure medications will resume amlodipine Toprol and  Imdur  #Hyperlipidemia check fasting lipid panel    #Coronary artery disease Patient stopped taking Plavix lisinopril, Toprol, Imdur, statin Will resume Toprol, Imdur and statin Holding aspirin and Plavix for now   All the records are reviewed and case discussed with ED provider. Management plans discussed with the patient, family and they are in agreement.  CODE STATUS: dnr   TOTAL TIME TAKING CARE OF THIS PATIENT: 41 minutes.   Note: This dictation was prepared with Dragon dictation along with smaller phrase technology. Any transcriptional errors that result from this process are unintentional.  Ramonita Lab M.D on 10/05/2017 at 2:50 PM  Between 7am to 6pm - Pager - [phone]  After 6pm go to [email or web address] - password [email or web address] [facility]  [facility]  Office  [phone]  CC: Primary care physician; Duanne Limerick, MD

## 2017-10-05 NOTE — ED Triage Notes (Signed)
Patient reports having right sided abdominal pain for several days, worse this morning.

## 2017-10-05 NOTE — ED Notes (Signed)
Pt unable to urinate at this time pt given urinal and asked to provide sample as soon as possible

## 2017-10-05 NOTE — ED Provider Notes (Signed)
[facility] Emergency Department Provider Note   ____________________________________________    I have reviewed the triage vital signs and the nursing notes.   HISTORY  Chief Complaint Abdominal Pain     HPI Kenneth Mitchell is a 84 y.o. male who presents with complaints of abdominal pain.  Patient reports he woke up this morning with pain in his right lower/right upper quadrant which he describes as sharp and moderate to severe.  He notes over the last several days he has felt a fullness in the area.  Does have a history of an appendectomy.  Denies fevers chills nausea vomiting diarrhea or constipation.  Has not taken anything for this.  No history of kidney stones.  No hematuria noted.  No dysuria noted   Past Medical History:  Diagnosis Date  . CAD (coronary artery disease)   . Diabetes mellitus (HCC)   . Gross hematuria   . Hyperlipidemia   . Hypertension   . Inguinal hernia   . Nodular prostate   . Syncope     Patient Active Problem List   Diagnosis Date Noted  . Type 2 diabetes mellitus without complication, without long-term current use of insulin (HCC) 01/30/2017  . Gross hematuria 03/26/2015  . History of elevated PSA 03/26/2015  . Nodular prostate 03/26/2015  . Insect bite 12/08/2014  . Arteriosclerosis of coronary artery 11/29/2014  . AAA (abdominal aortic aneurysm) (HCC) 10/04/2013  . Carotid artery narrowing 10/04/2013  . Dyslipidemia 10/04/2013  . BP (high blood pressure) 10/04/2013  . Excessive urination at night 10/04/2013    Past Surgical History:  Procedure Laterality Date  . aneurysm surgery     x 2  . quadrupal bypass      Prior to Admission medications   Medication Sig Start Date End Date Taking? Authorizing Provider  aspirin 81 MG tablet Take 81 mg by mouth daily.   Yes [provider]  metFORMIN (GLUCOPHAGE) 500 MG tablet Take 1 tablet (500 mg total) by mouth 2 (two) times daily with a meal. Patient  taking differently: Take 500 mg by mouth 2 (two) times daily with a meal. Dr Deloria LairAbby 04/09/15  Yes Duanne LimerickJones, Deanna C, MD  nitroGLYCERIN (NITRODUR - DOSED IN MG/24 HR) 0.2 mg/hr patch UNWRAP AND APPLY 1 PATCH ONCE DAILY, LEAVE ON FOR 12-14 HOURS(WAIT 10-12 HOURS BEFORE NEXT PATCH) 01/30/17  Yes Duanne LimerickJones, Deanna C, MD  amLODipine (NORVASC) 10 MG tablet Take 1 tablet (10 mg total) by mouth daily at 6 (six) AM. Patient not taking: Reported on 10/05/2017 01/30/17   Duanne LimerickJones, Deanna C, MD  clopidogrel (PLAVIX) 75 MG tablet Take 1 tablet (75 mg total) by mouth daily at 6 (six) AM. Patient not taking: Reported on 10/05/2017 01/30/17   Duanne LimerickJones, Deanna C, MD  glipiZIDE (GLUCOTROL) 5 MG tablet Take 5 mg by mouth 2 (two) times daily before a meal. Dr Deloria LairAbby    [provider]  hydrochlorothiazide (HYDRODIURIL) 25 MG tablet Take 1 tablet (25 mg total) by mouth daily at 6 (six) AM. Patient not taking: Reported on 10/05/2017 01/30/17   Duanne LimerickJones, Deanna C, MD  isosorbide mononitrate (IMDUR) 30 MG 24 hr tablet Take 1 tablet (30 mg total) by mouth daily at 6 (six) AM. Patient not taking: Reported on 10/05/2017 01/30/17   Duanne LimerickJones, Deanna C, MD  lisinopril (PRINIVIL,ZESTRIL) 5 MG tablet Take 1 tablet (5 mg total) by mouth daily. Patient not taking: Reported on 10/05/2017 01/30/17 01/30/18  Duanne LimerickJones, Deanna C, MD  lovastatin (  MEVACOR) 20 MG tablet Take 1 tablet (20 mg total) by mouth daily at 6 (six) AM. Patient not taking: Reported on 10/05/2017 01/30/17   Duanne Limerick, MD  metoprolol succinate (TOPROL-XL) 25 MG 24 hr tablet Take 1 tablet (25 mg total) by mouth daily at 6 (six) AM. Patient not taking: Reported on 10/05/2017 01/30/17   Duanne Limerick, MD     Allergies Patient has no known allergies.  Family History  Problem Relation Age of Onset  . Diabetes Mother   . Heart disease Father   . Stroke Father   . Cancer Brother   . Kidney disease Neg Hx   . Prostate cancer Neg Hx     Social History Social History    Tobacco Use  . Smoking status: Former Games developer  . Smokeless tobacco: Never Used  . Tobacco comment: quit 20 years  Substance Use Topics  . Alcohol use: No    Alcohol/week: 0.0 oz  . Drug use: No    Review of Systems  Constitutional: No fever/chills Eyes: No visual changes.  ENT: No sore throat. Cardiovascular: Denies chest pain. Respiratory: Denies shortness of breath. Gastrointestinal: As above Genitourinary: No dysuria. Musculoskeletal: Negative for back pain. Skin: Negative for rash. Neurological: Negative for headaches    ____________________________________________   PHYSICAL EXAM:  VITAL SIGNS: ED Triage Vitals  Enc Vitals Group     BP 10/05/17 0655 (!) 182/104     Pulse Rate 10/05/17 0655 65     Resp 10/05/17 0655 18     Temp 10/05/17 0655 (!) 97.5 F (36.4 C)     Temp Source 10/05/17 0655 Oral     SpO2 10/05/17 0655 96 %     Weight 10/05/17 0654 90.7 kg (200 lb)     Height 10/05/17 0654 1.803 m (5\' 11" )     Head Circumference --      Peak Flow --      Pain Score 10/05/17 0653 7     Pain Loc --      Pain Edu? --      Excl. in GC? --     Constitutional: Alert and oriented.  Pleasant and interactive Eyes: Conjunctivae are normal.   Nose: No congestion/rhinnorhea. Mouth/Throat: Mucous membranes are moist.    Cardiovascular: Normal rate, regular rhythm. Grossly normal heart sounds.  Good peripheral circulation. Respiratory: Normal respiratory effort.  No retractions. Lungs CTAB. Gastrointestinal: Mild tenderness to palpation in the right upper quadrant. No distention.    Musculoskeletal: No lower extremity tenderness nor edema.  Warm and well perfused Neurologic:  Normal speech and language. No gross focal neurologic deficits are appreciated.  Skin:  Skin is warm, dry and intact. No rash noted. Psychiatric: Mood and affect are normal. Speech and behavior are normal.  ____________________________________________   LABS (all labs ordered are  listed, but only abnormal results are displayed)  Labs Reviewed  URINALYSIS, COMPLETE (UACMP) WITH MICROSCOPIC - Abnormal; Notable for the following components:      Result Value   Hgb urine dipstick TRACE (*)    Protein, ur 100 (*)    All other components within normal limits  CBC - Abnormal; Notable for the following components:   RDW 15.2 (*)    Platelets 97 (*)    All other components within normal limits  COMPREHENSIVE METABOLIC PANEL - Abnormal; Notable for the following components:   Potassium 5.3 (*)    CO2 18 (*)    Glucose, Bld 167 (*)  Calcium 8.6 (*)    ALT 16 (*)    Total Bilirubin 1.7 (*)    GFR calc non Af Amer 54 (*)    All other components within normal limits  LIPASE, BLOOD   ____________________________________________  EKG  None ____________________________________________  RADIOLOGY  CT scan demonstrates endoleak and pseudoaneurysms, discussed with Dr. Gilda Crease ____________________________________________   PROCEDURES  Procedure(s) performed: No  Procedures   Critical Care performed: No ____________________________________________   INITIAL IMPRESSION / ASSESSMENT AND PLAN / ED COURSE  Pertinent labs & imaging results that were available during my care of the patient were reviewed by me and considered in my medical decision making (see chart for details).  Patient presents with right-sided abdominal pain.  History of an appendectomy.  Differential includes cholecystitis, colitis/enteritis, diverticulitis, ureterolithiasis.  We will give IV morphine, IV Zofran.  Check labs initially and will reevaluate  CT scan demonstrates type II endoleak, discussed with Dr. Gilda Crease vascular surgery who studied the images and reports no intervention is needed for this finding  ----------------------------------------- 1:59 PM on 10/05/2017 -----------------------------------------  Patient continues to have pain, second dose of IV morphine ordered.   Given continued pain will discuss with the hospitalist for admission    ____________________________________________   FINAL CLINICAL IMPRESSION(S) / ED DIAGNOSES  Final diagnoses:  Generalized abdominal pain        Note:  This document was prepared using Dragon voice recognition software and may include unintentional dictation errors.    Jene Every, MD 10/05/17 1400

## 2017-10-05 NOTE — ED Notes (Signed)
Pt denies any N/V/D. Pt states he is having constant pain in RLQ but occasionally having sharp pain intermittently.

## 2017-10-05 NOTE — Progress Notes (Signed)
Family Meeting Note  Advance Directive:yes  Today a meeting took place with the Patient.    The following clinical team members were present during this meeting:MD  The following were discussed:Patient's diagnosis: Acute abdominal pain, chronic type II endoleak from the graft, hypokalemia, thrombocytopenia Treatment plan of care discussed in detail with the patient  Other comorbidities  . CAD (coronary artery disease)   . Diabetes mellitus (HCC)   . Gross hematuria   . Hyperlipidemia   . Hypertension   . Inguinal hernia   . Nodular prostate   . Syncope     patient's progosis: Unable to determine and Goals for treatment: DNR   Daughter Marcelino DusterMichelle is the healthcare power of attorney  Additional follow-up to be provided: hospitalist, vascular surgery  Time spent during discussion:17 min  Ramonita LabAruna Olly Shiner, MD

## 2017-10-06 NOTE — Progress Notes (Signed)
Pt alert and oriented x 4. Denies pain. Rested all night with no complaints. Pt came out to the nurses station and asked " What is the process to get out of here". Pt was adamant about leaving. He said he was no longer hurting and he did not feel the need to stay. This Clinical research associatewriter notified [employer]. She came to speak with patient in his room but he still wanted to leave. Notified Dr Sheryle Hailiamond that pt wishes to leave AMA. This Clinical research associatewriter had him sign the AMA form. This Clinical research associatewriter then escorted him out through the ER lobby via w/c and made sure he got in his vehicle.

## 2017-10-06 NOTE — Discharge Summary (Signed)
Date of admission 10/05/2017 Date left hospital AGAINST MEDICAL ADVICE 10/06/2017 at 5:10 AM approximately  History and physical :Wilford CornerDean Weiss  is a 84 y.o. male with a known history of coronary artery disease, diabetes metas, hypertension, hyperlipidemia and other medical problems is presenting to the ED today with a chief complaint of right lower quadrant abdominal pain.  Patient denies any nausea vomiting or bloody bowel movements.  CT abdomen has revealed chronic endoleak of the AAA graft.  ED physician has discussed with the\surgery Dr. Gilda CreaseSchnier who has recommended no interventions at this time.  Hospitalist team is consulted as patient is still having significant abdominal pain  #Acute abdominal pain-unclear etiology Admit in observation and pain management as needed Vascular surgery consulted-as CT has revealed type II endoleak from the AAA graft , as per the ED physicians discussion no interventions needed at this time  #Hyperkalemia Kayexalate and recheck  #Thrombocytopenia Platelet count at 97,000 Hold aspirin Patient stopped taking Plavix  #Diabetes mellitus sliding scale insulin and continue glipizide  #Hypertension blood pressure is elevated Patient stopped taking his blood pressure medications will resume amlodipine Toprol and Imdur  #Hyperlipidemia check fasting lipid panel    #Coronary artery disease Patient stopped taking Plavix lisinopril, Toprol, Imdur, statin Will resume Toprol, Imdur and statin Holding aspirin and Plavix for now   On October 06, 2017 at around 5:05 AM Patient came to the nurse and asked her to the process to get out of the hospital.  He says he was no longer hurting and does not want to stay.  RN notified on-call hospitalist Dr. Sheryle Haildiamond that patien wishes to leave AGAINST MEDICAL ADVICE. patient signed AMA form patient left hospital AMA

## 2017-10-20 ENCOUNTER — Other Ambulatory Visit: Payer: Self-pay

## 2017-10-20 ENCOUNTER — Encounter: Payer: Self-pay | Admitting: Family Medicine

## 2017-10-20 ENCOUNTER — Ambulatory Visit (INDEPENDENT_AMBULATORY_CARE_PROVIDER_SITE_OTHER): Payer: Medicare Other | Admitting: Family Medicine

## 2017-10-20 VITALS — BP 120/80 | HR 64 | Ht 71.0 in | Wt 192.0 lb

## 2017-10-20 DIAGNOSIS — E785 Hyperlipidemia, unspecified: Secondary | ICD-10-CM

## 2017-10-20 DIAGNOSIS — L309 Dermatitis, unspecified: Secondary | ICD-10-CM

## 2017-10-20 DIAGNOSIS — I10 Essential (primary) hypertension: Secondary | ICD-10-CM

## 2017-10-20 DIAGNOSIS — L509 Urticaria, unspecified: Secondary | ICD-10-CM | POA: Diagnosis not present

## 2017-10-20 DIAGNOSIS — I2510 Atherosclerotic heart disease of native coronary artery without angina pectoris: Secondary | ICD-10-CM

## 2017-10-20 DIAGNOSIS — E119 Type 2 diabetes mellitus without complications: Secondary | ICD-10-CM

## 2017-10-20 MED ORDER — PREDNISONE 10 MG PO TABS: ORAL_TABLET | ORAL | 1 refills | Status: DC

## 2017-10-20 MED ORDER — HYDROXYZINE HCL 10 MG PO TABS: 10.0000 mg | ORAL_TABLET | Freq: Three times a day (TID) | ORAL | 0 refills | Status: DC | PRN

## 2017-10-20 NOTE — Patient Instructions (Signed)
Hives  Hives (urticaria) are itchy, red, swollen areas on your skin. Hives can appear on any part of your body and can vary in size. They can be as small as the tip of a pen or much larger. Hives often fade within 24 hours (acute hives). In other cases, new hives appear after old ones fade. This cycle can continue for several days or weeks (chronic hives).  Hives result from your body's reaction to an irritant or to something that you are allergic to (trigger). When you are exposed to a trigger, your body releases a chemical (histamine) that causes redness, itching, and swelling. You can get hives immediately after being exposed to a trigger or hours later.  Hives do not spread from person to person (are not contagious). Your hives may get worse with scratching, exercise, and emotional stress.  What are the causes?  Causes of this condition include:   Allergies to certain foods or ingredients.   Insect bites or stings.   Exposure to pollen or pet dander.   Contact with latex or chemicals.   Spending time in sunlight, heat, or cold (exposure).   Exercise.   Stress.    You can also get hives from some medical conditions and treatments. These include:   Viruses, including the common cold.   Bacterial infections, such as urinary tract infections and strep throat.   Disorders such as vasculitis, lupus, or thyroid disease.   Certain medications.   Allergy shots.   Blood transfusions.    Sometimes, the cause of hives is not known (idiopathic hives).  What increases the risk?  This condition is more likely to develop in:   Women.   People who have food allergies, especially to citrus fruits, milk, eggs, peanuts, tree nuts, or shellfish.   People who are allergic to:  ? Medicines.  ? Latex.  ? Insects.  ? Animals.  ? Pollen.   People who have certain medical conditions, includinglupus or thyroid disease.    What are the signs or symptoms?  The main symptom of this condition is raised, itchyred or white  bumps or patches on your skin. These areas may:   Become large and swollen (welts).   Change in shape and location, quickly and repeatedly.   Be separate hives or connect over a large area of skin.   Sting or become painful.   Turn white when pressed in the center (blanch).    In severe cases, yourhands, feet, and face may also become swollen. This may occur if hives develop deeper in your skin.  How is this diagnosed?  This condition is diagnosed based on your symptoms, medical history, and physical exam. Your skin, urine, or blood may be tested to find out what is causing your hives and to rule out other health issues. Your health care provider may also remove a small sample of skin from the affected area and examine it under a microscope (biopsy).  How is this treated?  Treatment depends on the severity of your condition. Your health care provider may recommend using cool, wet cloths (cool compresses) or taking cool showers to relieve itching. Hives are sometimes treated with medicines, including:   Antihistamines.   Corticosteroids.   Antibiotics.   An injectable medicine (omalizumab). Your health care provider may prescribe this if you have chronic idiopathic hives and you continue to have symptoms even after treatment with antihistamines.    Severe cases may require an emergency injection of adrenaline (epinephrine) to prevent a   life-threatening allergic reaction (anaphylaxis).  Follow these instructions at home:  Medicines   Take or apply over-the-counter and prescription medicines only as told by your health care provider.   If you were prescribed an antibiotic medicine, use it as told by your health care provider. Do not stop taking the antibiotic even if you start to feel better.  Skin Care   Apply cool compresses to the affected areas.   Do not scratch or rub your skin.  General instructions   Do not take hot showers or baths. This can make itching worse.   Do not wear tight-fitting  clothing.   Use sunscreen and wear protective clothing when you are outside.   Avoid any substances that cause your hives. Keep a journal to help you track what causes your hives. Write down:  ? What medicines you take.  ? What you eat and drink.  ? What products you use on your skin.   Keep all follow-up visits as told by your health care provider. This is important.  Contact a health care provider if:   Your symptoms are not controlled with medicine.   Your joints are painful or swollen.  Get help right away if:   You have a fever.   You have pain in your abdomen.   Your tongue or lips are swollen.   Your eyelids are swollen.   Your chest or throat feels tight.   You have trouble breathing or swallowing.  These symptoms may represent a serious problem that is an emergency. Do not wait to see if the symptoms will go away. Get medical help right away. Call your local emergency services (911 in the [place] Do not drive yourself to the hospital.  This information is not intended to replace advice given to you by your health care provider. Make sure you discuss any questions you have with your health care provider.  Document Released: 04/07/2005 Document Revised: 09/05/2015 Document Reviewed: 01/24/2015  Elsevier Interactive Patient Education  2018 Elsevier Inc[employer]

## 2017-10-20 NOTE — Progress Notes (Signed)
Name: Kenneth Mitchell   MRN: [id number]    DOB: 1933-05-22   Date:10/20/2017       Progress Note  Subjective  Chief Complaint  Chief Complaint  Patient presents with  . skin itching    all over    Rash  This is a new problem. The current episode started 1 to 4 weeks ago. The problem has been gradually worsening since onset. The rash is diffuse. He was exposed to an insect bite/sting (multiwasp episode/20-25). Pertinent negatives include no anorexia, congestion, cough, diarrhea, eye pain, facial edema, fatigue, fever, joint pain, nail changes, rhinorrhea, shortness of breath, sore throat or vomiting. The treatment provided moderate relief. There is no history of allergies, asthma, eczema or varicella.    No problem-specific Assessment & Plan notes found for this encounter.   Past Medical History:  Diagnosis Date  . CAD (coronary artery disease)   . Diabetes mellitus (HCC)   . Gross hematuria   . Hyperlipidemia   . Hypertension   . Inguinal hernia   . Nodular prostate   . Syncope     Past Surgical History:  Procedure Laterality Date  . aneurysm surgery     x 2  . quadrupal bypass      Family History  Problem Relation Age of Onset  . Diabetes Mother   . Heart disease Father   . Stroke Father   . Cancer Brother   . Kidney disease Neg Hx   . Prostate cancer Neg Hx     Social History   Socioeconomic History  . Marital status: Single    Spouse name: Not on file  . Number of children: Not on file  . Years of education: Not on file  . Highest education level: Not on file  Occupational History  . Not on file  Social Needs  . Financial resource strain: Not on file  . Food insecurity:    Worry: Not on file    Inability: Not on file  . Transportation needs:    Medical: Not on file    Non-medical: Not on file  Tobacco Use  . Smoking status: Former Games developer  . Smokeless tobacco: Never Used  . Tobacco comment: quit 20 years  Substance and Sexual Activity  . Alcohol  use: No    Alcohol/week: 0.0 oz  . Drug use: No  . Sexual activity: Never  Lifestyle  . Physical activity:    Days per week: Not on file    Minutes per session: Not on file  . Stress: Not on file  Relationships  . Social connections:    Talks on phone: Not on file    Gets together: Not on file    Attends religious service: Not on file    Active member of club or organization: Not on file    Attends meetings of clubs or organizations: Not on file    Relationship status: Not on file  . Intimate partner violence:    Fear of current or ex partner: Not on file    Emotionally abused: Not on file    Physically abused: Not on file    Forced sexual activity: Not on file  Other Topics Concern  . Not on file  Social History Narrative  . Not on file    No Known Allergies  Outpatient Medications Prior to Visit  Medication Sig Dispense Refill  . amLODipine (NORVASC) 10 MG tablet Take 1 tablet (10 mg total) by mouth daily at 6 (six) AM.  90 tablet 1  . aspirin 81 MG tablet Take 81 mg by mouth daily.    . clopidogrel (PLAVIX) 75 MG tablet Take 1 tablet (75 mg total) by mouth daily at 6 (six) AM. 90 tablet 1  . glipiZIDE (GLUCOTROL) 5 MG tablet Take 5 mg by mouth 2 (two) times daily before a meal. Dr Deloria Lair    . hydrochlorothiazide (HYDRODIURIL) 25 MG tablet Take 1 tablet (25 mg total) by mouth daily at 6 (six) AM. 90 tablet 1  . isosorbide mononitrate (IMDUR) 30 MG 24 hr tablet Take 1 tablet (30 mg total) by mouth daily at 6 (six) AM. 90 tablet 1  . lisinopril (PRINIVIL,ZESTRIL) 5 MG tablet Take 1 tablet (5 mg total) by mouth daily. 90 tablet 1  . lovastatin (MEVACOR) 20 MG tablet Take 1 tablet (20 mg total) by mouth daily at 6 (six) AM. 90 tablet 1  . metFORMIN (GLUCOPHAGE) 500 MG tablet Take 1 tablet (500 mg total) by mouth 2 (two) times daily with a meal. (Patient taking differently: Take 500 mg by mouth 2 (two) times daily with a meal. Dr Deloria Lair) 60 tablet 0  . metoprolol succinate  (TOPROL-XL) 25 MG 24 hr tablet Take 1 tablet (25 mg total) by mouth daily at 6 (six) AM. (Patient taking differently: Take 50 mg by mouth daily at 6 (six) AM. ) 30 tablet 0  . nitroGLYCERIN (NITRODUR - DOSED IN MG/24 HR) 0.2 mg/hr patch UNWRAP AND APPLY 1 PATCH ONCE DAILY, LEAVE ON FOR 12-14 HOURS(WAIT 10-12 HOURS BEFORE NEXT PATCH) 90 patch 0   No facility-administered medications prior to visit.     Review of Systems  Constitutional: Negative for chills, fatigue, fever, malaise/fatigue and weight loss.  HENT: Negative for congestion, ear discharge, ear pain, rhinorrhea and sore throat.   Eyes: Negative for blurred vision and pain.  Respiratory: Negative for cough, sputum production, shortness of breath and wheezing.   Cardiovascular: Negative for chest pain, palpitations and leg swelling.  Gastrointestinal: Negative for abdominal pain, anorexia, blood in stool, constipation, diarrhea, heartburn, melena, nausea and vomiting.  Genitourinary: Negative for dysuria, frequency, hematuria and urgency.  Musculoskeletal: Negative for back pain, joint pain, myalgias and neck pain.  Skin: Positive for rash. Negative for nail changes.  Neurological: Negative for dizziness, tingling, sensory change, focal weakness and headaches.  Endo/Heme/Allergies: Negative for environmental allergies and polydipsia. Does not bruise/bleed easily.  Psychiatric/Behavioral: Negative for depression and suicidal ideas. The patient is not nervous/anxious and does not have insomnia.      Objective  Vitals:   10/20/17 0927  BP: 120/80  Pulse: 64  Weight: 192 lb (87.1 kg)    Physical Exam  Constitutional: He is oriented to person, place, and time.  HENT:  Head: Normocephalic.  Right Ear: External ear normal.  Left Ear: External ear normal.  Nose: Nose normal.  Mouth/Throat: Oropharynx is clear and moist.  Eyes: Pupils are equal, round, and reactive to light. Conjunctivae and EOM are normal. Right eye exhibits no  discharge. Left eye exhibits no discharge. No scleral icterus.  Neck: Normal range of motion. Neck supple. No JVD present. No tracheal deviation present. No thyromegaly present.  Cardiovascular: Normal rate, regular rhythm, S1 normal, S2 normal, normal heart sounds and intact distal pulses. Exam reveals no gallop, no S3, no S4 and no friction rub.  No murmur heard. Pulses:      Carotid pulses are 1+ on the right side, and 1+ on the left side.  Radial pulses are 1+ on the right side, and 1+ on the left side.       Femoral pulses are 1+ on the right side, and 1+ on the left side.      Popliteal pulses are 1+ on the right side, and 1+ on the left side.       Dorsalis pedis pulses are 1+ on the right side, and 1+ on the left side.       Posterior tibial pulses are 1+ on the right side, and 1+ on the left side.  Pulmonary/Chest: Breath sounds normal. No respiratory distress. He has no wheezes. He has no rales.  Abdominal: Soft. Bowel sounds are normal. He exhibits no mass. There is no hepatosplenomegaly. There is no tenderness. There is no rebound, no guarding and no CVA tenderness.  Musculoskeletal: Normal range of motion. He exhibits no edema or tenderness.  Lymphadenopathy:    He has no cervical adenopathy.  Neurological: He is alert and oriented to person, place, and time. He has normal strength and normal reflexes. No cranial nerve deficit.  Skin: Skin is warm. No rash noted.  Nursing note and vitals reviewed.     Assessment & Plan  Problem List Items Addressed This Visit    None    Visit Diagnoses    Dermatitis    -  Primary   S/P multiple wasp encounter. Subsequent developed generalized pruritis/ hives. will trat with Prednisone taper and prn atarax for pruritis.   Relevant Medications   hydrOXYzine (ATARAX/VISTARIL) 10 MG tablet   predniSONE (DELTASONE) 10 MG tablet   Urticarial dermatitis       Noted hives worsened by heat bid.      Meds ordered this encounter   Medications  . hydrOXYzine (ATARAX/VISTARIL) 10 MG tablet    Sig: Take 1 tablet (10 mg total) by mouth 3 (three) times daily as needed.    Dispense:  30 tablet    Refill:  0  . predniSONE (DELTASONE) 10 MG tablet    Sig: Taper 6,6,6,5,5,5,4,4,3,3,2,2,1,1    Dispense:  53 tablet    Refill:  1      Dr. Hayden Rasmusseneanna Jaselyn Nahm [facility]  10/20/17

## 2017-10-21 MED ORDER — CLOPIDOGREL BISULFATE 75 MG PO TABS: 75.0000 mg | ORAL_TABLET | Freq: Every day | ORAL | 0 refills | Status: DC

## 2017-10-21 MED ORDER — AMLODIPINE BESYLATE 10 MG PO TABS: 10.0000 mg | ORAL_TABLET | Freq: Every day | ORAL | 0 refills | Status: DC

## 2017-10-21 MED ORDER — ISOSORBIDE MONONITRATE ER 30 MG PO TB24: 30.0000 mg | ORAL_TABLET | Freq: Every day | ORAL | 0 refills | Status: DC

## 2017-10-21 MED ORDER — METOPROLOL SUCCINATE ER 25 MG PO TB24: 50.0000 mg | ORAL_TABLET | Freq: Every day | ORAL | 0 refills | Status: DC

## 2017-10-21 MED ORDER — GLIPIZIDE 5 MG PO TABS: 5.0000 mg | ORAL_TABLET | Freq: Two times a day (BID) | ORAL | 0 refills | Status: DC

## 2017-10-21 MED ORDER — HYDROCHLOROTHIAZIDE 25 MG PO TABS: 25.0000 mg | ORAL_TABLET | Freq: Every day | ORAL | 0 refills | Status: DC

## 2017-10-21 MED ORDER — LOVASTATIN 20 MG PO TABS: 20.0000 mg | ORAL_TABLET | Freq: Every day | ORAL | 0 refills | Status: DC

## 2017-10-21 MED ORDER — METFORMIN HCL 500 MG PO TABS: 500.0000 mg | ORAL_TABLET | Freq: Two times a day (BID) | ORAL | 0 refills | Status: DC

## 2017-10-23 DIAGNOSIS — E785 Hyperlipidemia, unspecified: Secondary | ICD-10-CM | POA: Diagnosis not present

## 2017-10-23 DIAGNOSIS — E1129 Type 2 diabetes mellitus with other diabetic kidney complication: Secondary | ICD-10-CM | POA: Diagnosis not present

## 2017-10-23 DIAGNOSIS — E1159 Type 2 diabetes mellitus with other circulatory complications: Secondary | ICD-10-CM | POA: Diagnosis not present

## 2017-10-23 DIAGNOSIS — R809 Proteinuria, unspecified: Secondary | ICD-10-CM | POA: Diagnosis not present

## 2017-10-23 DIAGNOSIS — E1169 Type 2 diabetes mellitus with other specified complication: Secondary | ICD-10-CM | POA: Diagnosis not present

## 2017-10-30 DIAGNOSIS — R809 Proteinuria, unspecified: Secondary | ICD-10-CM | POA: Diagnosis not present

## 2017-10-30 DIAGNOSIS — E1129 Type 2 diabetes mellitus with other diabetic kidney complication: Secondary | ICD-10-CM | POA: Diagnosis not present

## 2017-10-30 DIAGNOSIS — E1159 Type 2 diabetes mellitus with other circulatory complications: Secondary | ICD-10-CM | POA: Diagnosis not present

## 2017-10-30 DIAGNOSIS — E785 Hyperlipidemia, unspecified: Secondary | ICD-10-CM | POA: Diagnosis not present

## 2017-10-30 DIAGNOSIS — E1169 Type 2 diabetes mellitus with other specified complication: Secondary | ICD-10-CM | POA: Diagnosis not present

## 2017-11-24 ENCOUNTER — Other Ambulatory Visit: Payer: Self-pay

## 2017-11-24 DIAGNOSIS — L309 Dermatitis, unspecified: Secondary | ICD-10-CM

## 2017-11-24 MED ORDER — HYDROXYZINE HCL 10 MG PO TABS: 10.0000 mg | ORAL_TABLET | Freq: Three times a day (TID) | ORAL | 1 refills | Status: DC | PRN

## 2018-01-28 ENCOUNTER — Other Ambulatory Visit: Payer: Self-pay | Admitting: Family Medicine

## 2018-02-25 ENCOUNTER — Other Ambulatory Visit: Payer: Self-pay

## 2018-02-25 NOTE — Patient Outreach (Signed)
[facility]) Care Management  02/25/2018  [name] 08/20/1933 [id number]   Medication Adherence call to [name] patient did not answer and voice mail is not set-up patient is due on Rosuvastatin 10 mg under [place] [employer] [facility] [employer].   Lillia Abed CPhT Pharmacy Technician [facility] Management Direct Dial [phone]  Fax [phone] [name]

## 2018-03-12 ENCOUNTER — Ambulatory Visit (INDEPENDENT_AMBULATORY_CARE_PROVIDER_SITE_OTHER): Payer: Medicare Other | Admitting: Family Medicine

## 2018-03-12 ENCOUNTER — Encounter: Payer: Self-pay | Admitting: Family Medicine

## 2018-03-12 VITALS — BP 128/80 | HR 80 | Temp 97.6°F | Ht 71.0 in | Wt 192.0 lb

## 2018-03-12 DIAGNOSIS — I25709 Atherosclerosis of coronary artery bypass graft(s), unspecified, with unspecified angina pectoris: Secondary | ICD-10-CM | POA: Diagnosis not present

## 2018-03-12 DIAGNOSIS — R0609 Other forms of dyspnea: Secondary | ICD-10-CM

## 2018-03-12 DIAGNOSIS — R809 Proteinuria, unspecified: Secondary | ICD-10-CM | POA: Diagnosis not present

## 2018-03-12 DIAGNOSIS — H6501 Acute serous otitis media, right ear: Secondary | ICD-10-CM | POA: Diagnosis not present

## 2018-03-12 DIAGNOSIS — E1169 Type 2 diabetes mellitus with other specified complication: Secondary | ICD-10-CM | POA: Diagnosis not present

## 2018-03-12 DIAGNOSIS — H6983 Other specified disorders of Eustachian tube, bilateral: Secondary | ICD-10-CM | POA: Diagnosis not present

## 2018-03-12 DIAGNOSIS — E785 Hyperlipidemia, unspecified: Secondary | ICD-10-CM | POA: Diagnosis not present

## 2018-03-12 DIAGNOSIS — E1129 Type 2 diabetes mellitus with other diabetic kidney complication: Secondary | ICD-10-CM | POA: Diagnosis not present

## 2018-03-12 DIAGNOSIS — E1159 Type 2 diabetes mellitus with other circulatory complications: Secondary | ICD-10-CM | POA: Diagnosis not present

## 2018-03-12 MED ORDER — AMOXICILLIN 500 MG PO CAPS: 500.0000 mg | ORAL_CAPSULE | Freq: Three times a day (TID) | ORAL | 0 refills | Status: DC

## 2018-03-12 NOTE — Progress Notes (Signed)
Date:  03/12/2018   Name:  Kenneth Mitchell   DOB:  06/04/1933   MRN:  [id number]   Chief Complaint: Ear Fullness Ear Fullness   There is pain in the right ear. This is a new problem. The current episode started today. The problem occurs constantly. The problem has been waxing and waning. There has been no fever. The pain is mild. Associated symptoms include hearing loss. Pertinent negatives include no abdominal pain, coughing, diarrhea, ear discharge, headaches, neck pain, rash, rhinorrhea or sore throat. He has tried nothing for the symptoms.  Shortness of Breath  This is a chronic problem. The current episode started more than 1 month ago. The problem occurs constantly. The problem has been gradually worsening. Pertinent negatives include no abdominal pain, chest pain, ear pain, fever, headaches, leg swelling, neck pain, PND, rash, rhinorrhea, sore throat or wheezing. Nothing aggravates the symptoms. Risk factors include smoking.  Congestive Heart Failure  Presents for follow-up visit. Associated symptoms include nocturia and shortness of breath. Pertinent negatives include no abdominal pain, chest pain, chest pressure, edema, fatigue, orthopnea, palpitations or paroxysmal nocturnal dyspnea. The symptoms have been worsening.  Heart Problem  This is a chronic problem. The problem has been waxing and waning. Pertinent negatives include no abdominal pain, anorexia, chest pain, chills, coughing, fatigue, fever, headaches, myalgias, nausea, neck pain, rash or sore throat.     Review of Systems  Constitutional: Negative for chills, fatigue and fever.  HENT: Positive for hearing loss. Negative for drooling, ear discharge, ear pain, rhinorrhea and sore throat.   Respiratory: Positive for shortness of breath. Negative for cough and wheezing.   Cardiovascular: Negative for chest pain, palpitations, leg swelling and PND.  Gastrointestinal: Negative for abdominal pain, anorexia, blood in stool,  constipation, diarrhea and nausea.  Endocrine: Negative for polydipsia.  Genitourinary: Positive for nocturia. Negative for dysuria, frequency, hematuria and urgency.  Musculoskeletal: Negative for back pain, myalgias and neck pain.  Skin: Negative for rash.  Allergic/Immunologic: Negative for environmental allergies.  Neurological: Negative for dizziness and headaches.  Hematological: Does not bruise/bleed easily.  Psychiatric/Behavioral: Negative for suicidal ideas. The patient is not nervous/anxious.     Patient Active Problem List   Diagnosis Date Noted  . Acute abdominal pain 10/05/2017  . Type 2 diabetes mellitus without complication, without long-term current use of insulin (HCC) 01/30/2017  . Gross hematuria 03/26/2015  . History of elevated PSA 03/26/2015  . Nodular prostate 03/26/2015  . Insect bite 12/08/2014  . Arteriosclerosis of coronary artery 11/29/2014  . AAA (abdominal aortic aneurysm) (HCC) 10/04/2013  . Carotid artery narrowing 10/04/2013  . Dyslipidemia 10/04/2013  . BP (high blood pressure) 10/04/2013  . Excessive urination at night 10/04/2013    No Known Allergies  Past Surgical History:  Procedure Laterality Date  . aneurysm surgery     x 2  . quadrupal bypass      Social History   Tobacco Use  . Smoking status: Former Games developermoker  . Smokeless tobacco: Never Used  . Tobacco comment: quit 20 years  Substance Use Topics  . Alcohol use: No    Alcohol/week: 0.0 standard drinks  . Drug use: No     Medication list has been reviewed and updated.  Current Meds  Medication Sig  . amLODipine (NORVASC) 10 MG tablet Take 1 tablet (10 mg total) by mouth daily at 6 (six) AM.  . aspirin 81 MG tablet Take 81 mg by mouth daily.  . clopidogrel (PLAVIX) 75  MG tablet Take 1 tablet (75 mg total) by mouth daily at 6 (six) AM.  . glipiZIDE (GLUCOTROL) 5 MG tablet TAKE 1 TABLET BY MOUTH TWICE DAILY BEFORE A MEAL  . hydrochlorothiazide (HYDRODIURIL) 25 MG tablet  Take 1 tablet (25 mg total) by mouth daily at 6 (six) AM.  . hydrOXYzine (ATARAX/VISTARIL) 10 MG tablet Take 1 tablet (10 mg total) by mouth 3 (three) times daily as needed.  . isosorbide mononitrate (IMDUR) 30 MG 24 hr tablet Take 1 tablet (30 mg total) by mouth daily at 6 (six) AM.  . lovastatin (MEVACOR) 20 MG tablet Take 1 tablet (20 mg total) by mouth daily at 6 (six) AM.  . metFORMIN (GLUCOPHAGE) 500 MG tablet Take 1 tablet (500 mg total) by mouth 2 (two) times daily with a meal.  . metoprolol succinate (TOPROL-XL) 25 MG 24 hr tablet Take 2 tablets (50 mg total) by mouth daily at 6 (six) AM.  . nitroGLYCERIN (NITRODUR - DOSED IN MG/24 HR) 0.2 mg/hr patch UNWRAP AND APPLY 1 PATCH ONCE DAILY, LEAVE ON FOR 12-14 HOURS(WAIT 10-12 HOURS BEFORE NEXT PATCH)    PHQ 2/9 Scores 04/23/2017 01/30/2017 01/11/2016 11/29/2014  PHQ - 2 Score 0 0 0 0  PHQ- 9 Score 1 - - -    Physical Exam  Constitutional: He is oriented to person, place, and time.  HENT:  Head: Normocephalic.  Right Ear: External ear and ear canal normal. Tympanic membrane is retracted. A middle ear effusion is present. Decreased hearing is noted.  Left Ear: External ear and ear canal normal. Tympanic membrane is retracted. Decreased hearing is noted.  Nose: Nose normal.  Mouth/Throat: Oropharynx is clear and moist.  Eyes: Pupils are equal, round, and reactive to light. Conjunctivae and EOM are normal. Right eye exhibits no discharge. Left eye exhibits no discharge. No scleral icterus.  Neck: Normal range of motion. Neck supple. No JVD present. No tracheal deviation present. No thyromegaly present.  Cardiovascular: Normal rate, regular rhythm, normal heart sounds and intact distal pulses. Exam reveals no gallop and no friction rub.  No murmur heard. Pulmonary/Chest: Breath sounds normal. No respiratory distress. He has no wheezes. He has no rales.  Abdominal: Soft. Bowel sounds are normal. He exhibits no mass. There is no  hepatosplenomegaly. There is no tenderness. There is no rebound, no guarding and no CVA tenderness.  Musculoskeletal: Normal range of motion. He exhibits no edema or tenderness.  Lymphadenopathy:    He has no cervical adenopathy.  Neurological: He is alert and oriented to person, place, and time. He has normal strength and normal reflexes. No cranial nerve deficit.  Skin: Skin is warm. No rash noted.  Nursing note and vitals reviewed.   BP 128/80   Pulse 80   Temp 97.6 F (36.4 C) (Oral)   Ht 5\' 11"  (1.803 m)   Wt 192 lb (87.1 kg)   BMI 26.78 kg/m   Assessment and Plan:  1. Non-recurrent acute serous otitis media of right ear This is acute over the last 24 hours with fullness and decreased hearing in the right ear predominantly.  Exam is consistent with an early otitis media versus serous otitis media.  Suggested that we use Afrin temporarily with amoxicillin 500 mg 3 times daily. - amoxicillin (AMOXIL) 500 MG capsule; Take 1 capsule (500 mg total) by mouth 3 (three) times daily.  Dispense: 30 capsule; Refill: 0  2. Dysfunction of both eustachian tubes Bilateral Both ears patient unable to clear.  Will initiate  Afrin for temporary.  3. Coronary artery disease involving coronary bypass graft of native heart with angina pectoris [facility]) Patient relates that he is having no chest discomfort, however patient has experienced increasing shortness of breath unable to do normal activity.  Appointment was made with cardiology next week to review medications, further evaluation, and changes as needed.  4. DOE (dyspnea on exertion) Patient has been experiencing increasing shortness of breath with minimal activity.  Last visit with cardiology metoprolol was initiated.  This treatment has been tolerated until most recently when dyspnea on exertion has increased significantly.  Appointment was made on Monday with cardiology for initial evaluation of current condition.  Patient told to go to emergency  room if symptoms should worsen or chest pain occur.   Dr. Hayden [facility]  03/12/2018

## 2018-03-15 DIAGNOSIS — I25708 Atherosclerosis of coronary artery bypass graft(s), unspecified, with other forms of angina pectoris: Secondary | ICD-10-CM | POA: Diagnosis not present

## 2018-03-15 DIAGNOSIS — R918 Other nonspecific abnormal finding of lung field: Secondary | ICD-10-CM | POA: Diagnosis not present

## 2018-03-15 DIAGNOSIS — I10 Essential (primary) hypertension: Secondary | ICD-10-CM | POA: Diagnosis not present

## 2018-03-15 DIAGNOSIS — R0902 Hypoxemia: Secondary | ICD-10-CM | POA: Diagnosis not present

## 2018-03-15 DIAGNOSIS — R0602 Shortness of breath: Secondary | ICD-10-CM | POA: Diagnosis not present

## 2018-03-15 DIAGNOSIS — E782 Mixed hyperlipidemia: Secondary | ICD-10-CM | POA: Diagnosis not present

## 2018-03-24 DIAGNOSIS — I083 Combined rheumatic disorders of mitral, aortic and tricuspid valves: Secondary | ICD-10-CM | POA: Diagnosis not present

## 2018-03-24 DIAGNOSIS — I371 Nonrheumatic pulmonary valve insufficiency: Secondary | ICD-10-CM | POA: Diagnosis not present

## 2018-03-24 DIAGNOSIS — I77819 Aortic ectasia, unspecified site: Secondary | ICD-10-CM | POA: Diagnosis not present

## 2018-03-24 DIAGNOSIS — R0602 Shortness of breath: Secondary | ICD-10-CM | POA: Diagnosis not present

## 2018-04-09 ENCOUNTER — Ambulatory Visit: Payer: Medicare Other | Admitting: Family Medicine

## 2018-04-09 DIAGNOSIS — R0602 Shortness of breath: Secondary | ICD-10-CM | POA: Diagnosis not present

## 2018-04-09 DIAGNOSIS — I10 Essential (primary) hypertension: Secondary | ICD-10-CM | POA: Diagnosis not present

## 2018-04-09 DIAGNOSIS — R0902 Hypoxemia: Secondary | ICD-10-CM | POA: Diagnosis not present

## 2018-04-09 DIAGNOSIS — I5082 Biventricular heart failure: Secondary | ICD-10-CM | POA: Diagnosis not present

## 2018-04-09 DIAGNOSIS — I25708 Atherosclerosis of coronary artery bypass graft(s), unspecified, with other forms of angina pectoris: Secondary | ICD-10-CM | POA: Diagnosis not present

## 2018-04-09 DIAGNOSIS — I340 Nonrheumatic mitral (valve) insufficiency: Secondary | ICD-10-CM | POA: Diagnosis not present

## 2018-04-16 ENCOUNTER — Encounter: Payer: Self-pay | Admitting: Family Medicine

## 2018-04-16 ENCOUNTER — Ambulatory Visit (INDEPENDENT_AMBULATORY_CARE_PROVIDER_SITE_OTHER): Payer: Medicare Other | Admitting: Family Medicine

## 2018-04-16 VITALS — BP 124/80 | HR 78 | Resp 16 | Ht 71.0 in | Wt 192.0 lb

## 2018-04-16 DIAGNOSIS — E119 Type 2 diabetes mellitus without complications: Secondary | ICD-10-CM | POA: Diagnosis not present

## 2018-04-16 DIAGNOSIS — I10 Essential (primary) hypertension: Secondary | ICD-10-CM | POA: Diagnosis not present

## 2018-04-16 DIAGNOSIS — E785 Hyperlipidemia, unspecified: Secondary | ICD-10-CM

## 2018-04-16 DIAGNOSIS — I2510 Atherosclerotic heart disease of native coronary artery without angina pectoris: Secondary | ICD-10-CM | POA: Diagnosis not present

## 2018-04-16 MED ORDER — ISOSORBIDE MONONITRATE ER 30 MG PO TB24: 30.0000 mg | ORAL_TABLET | Freq: Every day | ORAL | 1 refills | Status: DC

## 2018-04-16 MED ORDER — AMLODIPINE BESYLATE 10 MG PO TABS: 10.0000 mg | ORAL_TABLET | Freq: Every day | ORAL | 1 refills | Status: DC

## 2018-04-16 MED ORDER — LISINOPRIL 5 MG PO TABS: 5.0000 mg | ORAL_TABLET | Freq: Every day | ORAL | 1 refills | Status: DC

## 2018-04-16 MED ORDER — METFORMIN HCL 500 MG PO TABS
500.0000 mg | ORAL_TABLET | Freq: Two times a day (BID) | ORAL | 1 refills | Status: AC
Start: 2018-04-16 — End: ?

## 2018-04-16 MED ORDER — GLIPIZIDE 5 MG PO TABS: ORAL_TABLET | ORAL | 1 refills | Status: DC

## 2018-04-16 NOTE — Progress Notes (Signed)
Date:  04/16/2018   Name:  Kenneth Mitchell   DOB:  Dec 19, 1933   MRN:  [id number]   Chief Complaint: Hypertension (why is he on Amlodipine  He is not taking this but it is on list )  Hypertension  This is a chronic problem. The current episode started more than 1 year ago. The problem is controlled. Pertinent negatives include no anxiety, blurred vision, chest pain, headaches, malaise/fatigue, neck pain, orthopnea, palpitations, peripheral edema, PND, shortness of breath or sweats. There are no associated agents to hypertension. Risk factors for coronary artery disease include diabetes mellitus and dyslipidemia. Past treatments include beta blockers, diuretics and alpha 1 blockers. The current treatment provides moderate improvement. There are no compliance problems.  Hypertensive end-organ damage includes CAD/MI and heart failure. There is no history of angina, kidney disease, CVA, left ventricular hypertrophy, PVD or retinopathy. There is no history of chronic renal disease, a hypertension causing med or renovascular disease.    Review of Systems  Constitutional: Negative for chills, fever and malaise/fatigue.  HENT: Negative for drooling, ear discharge, ear pain and sore throat.   Eyes: Negative for blurred vision.  Respiratory: Negative for cough, shortness of breath and wheezing.   Cardiovascular: Negative for chest pain, palpitations, orthopnea, leg swelling and PND.  Gastrointestinal: Negative for abdominal pain, blood in stool, constipation, diarrhea and nausea.  Endocrine: Negative for polydipsia.  Genitourinary: Negative for dysuria, frequency, hematuria and urgency.  Musculoskeletal: Negative for back pain, myalgias and neck pain.  Skin: Negative for rash.  Allergic/Immunologic: Negative for environmental allergies.  Neurological: Negative for dizziness and headaches.  Hematological: Does not bruise/bleed easily.  Psychiatric/Behavioral: Negative for suicidal ideas. The patient  is not nervous/anxious.     Patient Active Problem List   Diagnosis Date Noted  . Acute abdominal pain 10/05/2017  . Type 2 diabetes mellitus without complication, without long-term current use of insulin (HCC) 01/30/2017  . Gross hematuria 03/26/2015  . History of elevated PSA 03/26/2015  . Nodular prostate 03/26/2015  . Insect bite 12/08/2014  . Arteriosclerosis of coronary artery 11/29/2014  . AAA (abdominal aortic aneurysm) (HCC) 10/04/2013  . Carotid artery narrowing 10/04/2013  . Dyslipidemia 10/04/2013  . BP (high blood pressure) 10/04/2013  . Excessive urination at night 10/04/2013    No Known Allergies  Past Surgical History:  Procedure Laterality Date  . aneurysm surgery     x 2  . quadrupal bypass      Social History   Tobacco Use  . Smoking status: Former Games developer  . Smokeless tobacco: Never Used  . Tobacco comment: quit 20 years  Substance Use Topics  . Alcohol use: No    Alcohol/week: 0.0 standard drinks  . Drug use: No     Medication list has been reviewed and updated.  Current Meds  Medication Sig  . aspirin 81 MG tablet Take 81 mg by mouth daily.  . carvedilol (COREG) 6.25 MG tablet Take 6.25 mg by mouth 2 (two) times daily with a meal.  . furosemide (LASIX) 40 MG tablet Take 40 mg by mouth.  Marland Kitchen glipiZIDE (GLUCOTROL) 5 MG tablet TAKE 1 TABLET BY MOUTH TWICE DAILY BEFORE A MEAL  . hydrOXYzine (ATARAX/VISTARIL) 10 MG tablet Take 1 tablet (10 mg total) by mouth 3 (three) times daily as needed.  . isosorbide mononitrate (IMDUR) 30 MG 24 hr tablet Take 1 tablet (30 mg total) by mouth daily at 6 (six) AM.  . lisinopril (PRINIVIL,ZESTRIL) 5 MG tablet Take 1  tablet (5 mg total) by mouth daily.  Marland Kitchen. lovastatin (MEVACOR) 20 MG tablet Take 1 tablet (20 mg total) by mouth daily at 6 (six) AM.  . metFORMIN (GLUCOPHAGE) 500 MG tablet Take 1 tablet (500 mg total) by mouth 2 (two) times daily with a meal.  . nitroGLYCERIN (NITRODUR - DOSED IN MG/24 HR) 0.2 mg/hr  patch UNWRAP AND APPLY 1 PATCH ONCE DAILY, LEAVE ON FOR 12-14 HOURS(WAIT 10-12 HOURS BEFORE NEXT PATCH)  . spironolactone (ALDACTONE) 25 MG tablet Take 25 mg by mouth daily.  . [DISCONTINUED] clopidogrel (PLAVIX) 75 MG tablet Take 1 tablet (75 mg total) by mouth daily at 6 (six) AM.  . [DISCONTINUED] hydrochlorothiazide (HYDRODIURIL) 25 MG tablet Take 1 tablet (25 mg total) by mouth daily at 6 (six) AM.  . [DISCONTINUED] metoprolol succinate (TOPROL-XL) 25 MG 24 hr tablet Take 2 tablets (50 mg total) by mouth daily at 6 (six) AM.    PHQ 2/9 Scores 04/23/2017 01/30/2017 01/11/2016 11/29/2014  PHQ - 2 Score 0 0 0 0  PHQ- 9 Score 1 - - -    Physical Exam HENT:     Head: Normocephalic.     Right Ear: External ear normal.     Left Ear: External ear normal.     Nose: Nose normal.  Eyes:     General: No scleral icterus.       Right eye: No discharge.        Left eye: No discharge.     Conjunctiva/sclera: Conjunctivae normal.     Pupils: Pupils are equal, round, and reactive to light.  Neck:     Musculoskeletal: Normal range of motion and neck supple.     Thyroid: No thyromegaly.     Vascular: No JVD.     Trachea: No tracheal deviation.  Cardiovascular:     Rate and Rhythm: Normal rate and regular rhythm.     Heart sounds: Normal heart sounds. No murmur. No friction rub. No gallop.   Pulmonary:     Effort: No respiratory distress.     Breath sounds: Normal breath sounds. No wheezing or rales.  Abdominal:     General: Bowel sounds are normal.     Palpations: Abdomen is soft. There is no mass.     Tenderness: There is no abdominal tenderness. There is no guarding or rebound.  Musculoskeletal: Normal range of motion.        General: No tenderness.  Lymphadenopathy:     Cervical: No cervical adenopathy.  Skin:    General: Skin is warm.     Findings: No rash.  Neurological:     Mental Status: He is alert and oriented to person, place, and time.     Cranial Nerves: No cranial nerve  deficit.     Deep Tendon Reflexes: Reflexes are normal and symmetric.     BP 124/80   Pulse 78   Resp 16   Ht 5\' 11"  (1.803 m)   Wt 192 lb (87.1 kg)   SpO2 98%   BMI 26.78 kg/m   Assessment and Plan: 1. Type 2 diabetes mellitus without complication, without long-term current use of insulin (HCC) .  Controlled.  Continue metformin 500 mg twice daily glipizide 5 mg 1 twice a day as well. - metFORMIN (GLUCOPHAGE) 500 MG tablet; Take 1 tablet (500 mg total) by mouth 2 (two) times daily with a meal.  Dispense: 180 tablet; Refill: 1 - glipiZIDE (GLUCOTROL) 5 MG tablet; TAKE 1 TABLET BY MOUTH TWICE DAILY  BEFORE A MEAL  Dispense: 180 tablet; Refill: 1  2. Dyslipidemia Chronic.  Stable. Patient refuses statin. 3. Essential hypertension Chronic.  Controlled.  Will continue lisinopril once a day amlodipine 10 mg once a day cardiologist has changed to Coreg 6.25 twice a day and Spironolactone 5 mg and furosemide 40 mg. - lisinopril (PRINIVIL,ZESTRIL) 5 MG tablet; Take 1 tablet (5 mg total) by mouth daily.  Dispense: 90 tablet; Refill: 1 - amLODipine (NORVASC) 10 MG tablet; Take 1 tablet (10 mg total) by mouth daily at 6 (six) AM.  Dispense: 90 tablet; Refill: 1  4. Arteriosclerosis of coronary artery Currently stable will continue on ASA 81 mg. - isosorbide mononitrate (IMDUR) 30 MG 24 hr tablet; Take 1 tablet (30 mg total) by mouth daily at 6 (six) AM.  Dispense: 90 tablet; Refill: 1

## 2018-05-26 IMAGING — CR DG SCAPULA*L*
2 series · 2 of 2 positions shown · non-contrast
Comparison: None.

CLINICAL DATA: Back pain after fall yesterday.

EXAM:
LEFT SCAPULA - 2+ VIEWS

[scapula ap]
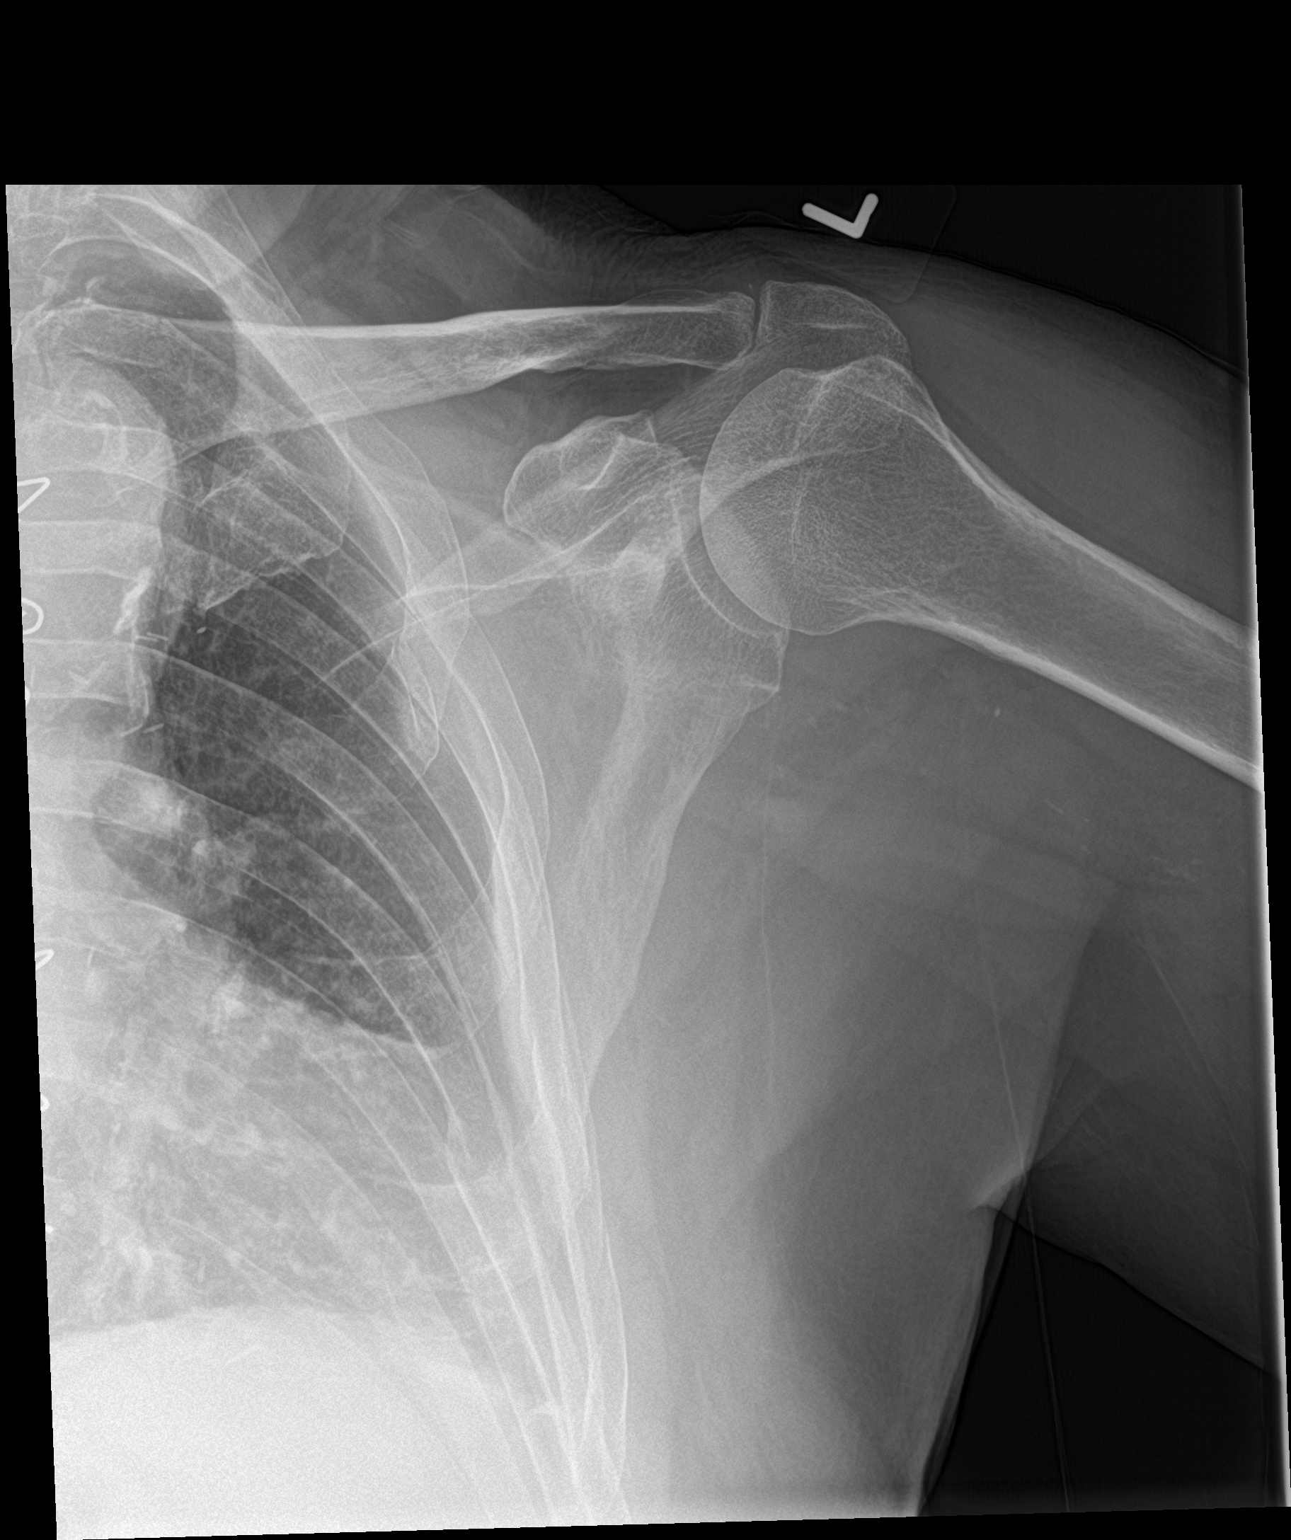

[scapula lat]
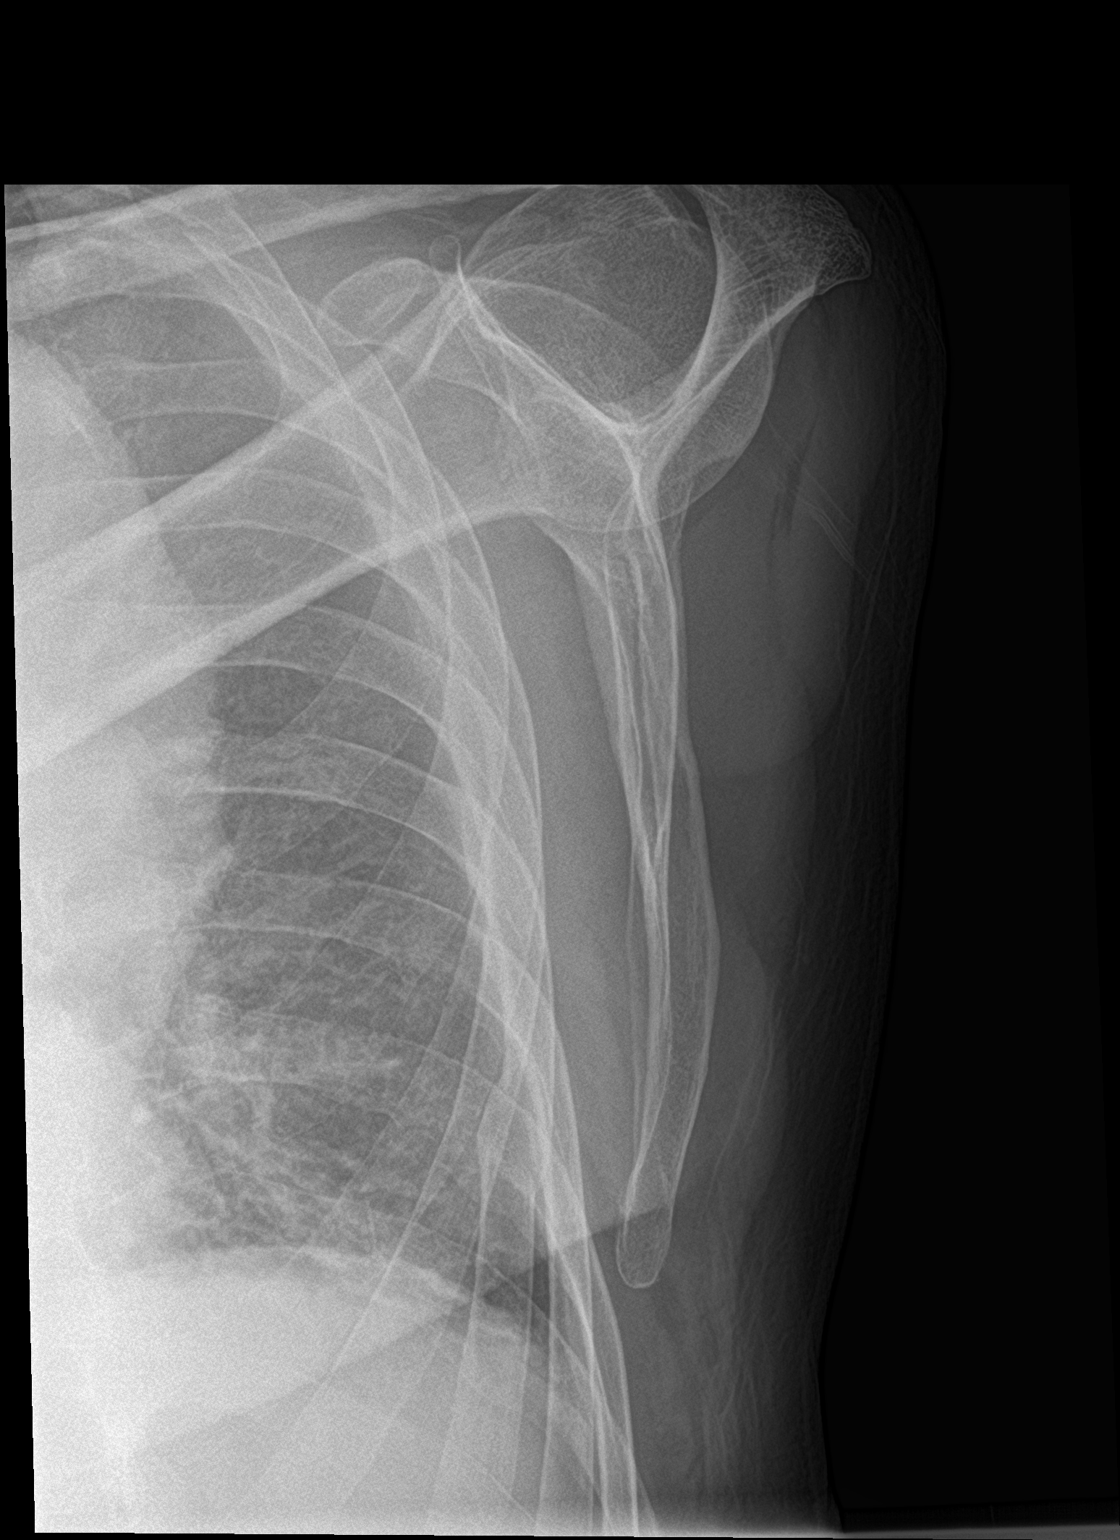

[2 of 2 positions shown; findings below may reference images not displayed]

FINDINGS: The scapula appears normal. Mildly displaced fracture seen involving
the posterior portion of the left second rib. Mild degenerative
changes seen involving the left acromioclavicular joint.
Glenohumeral joint appears normal.
IMPRESSION: Normal scapula. Mildly displaced fracture involving posterior
portion of left second rib.

## 2018-06-18 ENCOUNTER — Telehealth: Payer: Self-pay | Admitting: Family Medicine

## 2018-06-18 NOTE — Telephone Encounter (Signed)
Called to schedule Medicare Annual Wellness Visit with the Nurse Health Advisor. Patient DECLINED. Trixie Rude, Care Guide.

## 2018-07-22 ENCOUNTER — Other Ambulatory Visit: Payer: Self-pay | Admitting: Family Medicine

## 2018-07-22 DIAGNOSIS — E119 Type 2 diabetes mellitus without complications: Secondary | ICD-10-CM

## 2018-08-17 ENCOUNTER — Telehealth: Payer: Self-pay

## 2018-08-17 ENCOUNTER — Other Ambulatory Visit: Payer: Self-pay

## 2018-08-17 DIAGNOSIS — N529 Male erectile dysfunction, unspecified: Secondary | ICD-10-CM

## 2018-08-17 NOTE — Telephone Encounter (Signed)
Called wanting Sildenafil prescription. The pt is taking Isosorbide for his heart. It is a "very high" contraindication to taking these meds together. Pt was told that it is too high of a risk because we can't tell him to stop his isosorbide neither can we prescribe the sildenafil while on the med. He has been instructed to notify his cardiologist at [facility] for a prescription or the request of one.

## 2018-09-03 ENCOUNTER — Other Ambulatory Visit: Payer: Self-pay

## 2018-09-03 NOTE — Patient Outreach (Signed)
[facility]) Care Management  09/03/2018  Kenneth Mitchell September 29, 1933 [id number]   Medication Adherence call to Kenneth Mitchell Hippa Identifiers Verify spoke with patient he is due on Rosuvastatin 10 mg patient explain he is taking all his medication he has 9 medication he takes and has no problem pick up or taking his medication. [facility] said patient last pick up on this medication was January/2020. Kenneth Mitchell is showing past due under [facility].   Lillia Abed CPhT Pharmacy Technician [facility] Care Management Direct Dial [phone]  Fax [phone] [email or web address]

## 2018-09-10 DIAGNOSIS — E1129 Type 2 diabetes mellitus with other diabetic kidney complication: Secondary | ICD-10-CM | POA: Diagnosis not present

## 2018-09-10 DIAGNOSIS — I10 Essential (primary) hypertension: Secondary | ICD-10-CM | POA: Diagnosis not present

## 2018-09-10 DIAGNOSIS — R809 Proteinuria, unspecified: Secondary | ICD-10-CM | POA: Diagnosis not present

## 2018-09-10 DIAGNOSIS — E1159 Type 2 diabetes mellitus with other circulatory complications: Secondary | ICD-10-CM | POA: Diagnosis not present

## 2018-09-10 DIAGNOSIS — E782 Mixed hyperlipidemia: Secondary | ICD-10-CM | POA: Diagnosis not present

## 2018-09-21 ENCOUNTER — Telehealth: Payer: Self-pay

## 2018-09-21 ENCOUNTER — Other Ambulatory Visit: Payer: Self-pay

## 2018-09-21 NOTE — Telephone Encounter (Signed)
Katie at [facility] said pt needed to be on Rosuvastatin- sent in Rx and notified pt. [phone]

## 2018-09-21 NOTE — Progress Notes (Unsigned)
Called in Rosuvastatin 10mg 

## 2018-10-01 ENCOUNTER — Ambulatory Visit: Payer: Self-pay | Admitting: Family Medicine

## 2018-10-12 ENCOUNTER — Other Ambulatory Visit: Payer: Self-pay

## 2018-10-12 DIAGNOSIS — I10 Essential (primary) hypertension: Secondary | ICD-10-CM

## 2018-10-12 MED ORDER — SPIRONOLACTONE 25 MG PO TABS: 25.0000 mg | ORAL_TABLET | Freq: Every day | ORAL | 0 refills | Status: DC

## 2018-10-29 ENCOUNTER — Ambulatory Visit: Payer: Medicare Other | Admitting: Family Medicine

## 2018-10-29 ENCOUNTER — Other Ambulatory Visit: Payer: Self-pay

## 2018-10-29 ENCOUNTER — Encounter: Payer: Self-pay | Admitting: Family Medicine

## 2018-10-29 ENCOUNTER — Ambulatory Visit (INDEPENDENT_AMBULATORY_CARE_PROVIDER_SITE_OTHER): Payer: Medicare Other | Admitting: Family Medicine

## 2018-10-29 VITALS — BP 120/70 | HR 60 | Ht 71.0 in | Wt 193.0 lb

## 2018-10-29 DIAGNOSIS — I25709 Atherosclerosis of coronary artery bypass graft(s), unspecified, with unspecified angina pectoris: Secondary | ICD-10-CM | POA: Diagnosis not present

## 2018-10-29 DIAGNOSIS — I2510 Atherosclerotic heart disease of native coronary artery without angina pectoris: Secondary | ICD-10-CM | POA: Diagnosis not present

## 2018-10-29 DIAGNOSIS — I10 Essential (primary) hypertension: Secondary | ICD-10-CM | POA: Diagnosis not present

## 2018-10-29 DIAGNOSIS — L309 Dermatitis, unspecified: Secondary | ICD-10-CM | POA: Diagnosis not present

## 2018-10-29 DIAGNOSIS — I714 Abdominal aortic aneurysm, without rupture, unspecified: Secondary | ICD-10-CM

## 2018-10-29 DIAGNOSIS — Z91199 Patient's noncompliance with other medical treatment and regimen due to unspecified reason: Secondary | ICD-10-CM

## 2018-10-29 DIAGNOSIS — Z9119 Patient's noncompliance with other medical treatment and regimen: Secondary | ICD-10-CM

## 2018-10-29 MED ORDER — FUROSEMIDE 40 MG PO TABS: 40.0000 mg | ORAL_TABLET | Freq: Every day | ORAL | 1 refills | Status: AC

## 2018-10-29 MED ORDER — LISINOPRIL 5 MG PO TABS: 5.0000 mg | ORAL_TABLET | Freq: Every day | ORAL | 1 refills | Status: AC

## 2018-10-29 MED ORDER — ISOSORBIDE MONONITRATE ER 30 MG PO TB24: 30.0000 mg | ORAL_TABLET | Freq: Every day | ORAL | 1 refills | Status: DC

## 2018-10-29 MED ORDER — AMLODIPINE BESYLATE 10 MG PO TABS: 10.0000 mg | ORAL_TABLET | Freq: Every day | ORAL | 1 refills | Status: AC

## 2018-10-29 MED ORDER — HYDROXYZINE HCL 10 MG PO TABS: 10.0000 mg | ORAL_TABLET | Freq: Three times a day (TID) | ORAL | 1 refills | Status: DC | PRN

## 2018-10-29 MED ORDER — CARVEDILOL 6.25 MG PO TABS: 6.2500 mg | ORAL_TABLET | Freq: Two times a day (BID) | ORAL | 1 refills | Status: AC

## 2018-10-29 MED ORDER — ROSUVASTATIN CALCIUM 10 MG PO TABS: 10.0000 mg | ORAL_TABLET | Freq: Every day | ORAL | 1 refills | Status: AC

## 2018-10-29 NOTE — Progress Notes (Signed)
Date:  10/29/2018   Name:  Kenneth Mitchell   DOB:  10/13/1933   MRN:  [id number]   Chief Complaint: Hypertension and Hyperlipidemia  Hypertension This is a chronic problem. The current episode started more than 1 year ago. The problem has been waxing and waning since onset. The problem is controlled. Pertinent negatives include no anxiety, blurred vision, chest pain, headaches, malaise/fatigue, neck pain, orthopnea, palpitations, peripheral edema, PND, shortness of breath or sweats. There are no associated agents to hypertension. Risk factors for coronary artery disease include dyslipidemia, male gender and smoking/tobacco exposure. Past treatments include ACE inhibitors, calcium channel blockers, beta blockers and diuretics. The current treatment provides moderate improvement. There are no compliance problems.  There is no history of angina, kidney disease, CAD/MI, CVA, heart failure, left ventricular hypertrophy, PVD or retinopathy. There is no history of chronic renal disease, a hypertension causing med or renovascular disease.  Hyperlipidemia This is a chronic problem. The current episode started more than 1 year ago. The problem is controlled. Recent lipid tests were reviewed and are normal. He has no history of chronic renal disease, diabetes, hypothyroidism or liver disease. Factors aggravating his hyperlipidemia include thiazides. Pertinent negatives include no chest pain, focal sensory loss, focal weakness, leg pain, myalgias or shortness of breath. Current antihyperlipidemic treatment includes statins. The current treatment provides moderate improvement of lipids. There are no compliance problems.  Risk factors for coronary artery disease include dyslipidemia and hypertension.    Review of Systems  Constitutional: Negative for chills, fever and malaise/fatigue.  HENT: Negative for drooling, ear discharge, ear pain and sore throat.   Eyes: Negative for blurred vision.  Respiratory:  Negative for cough, shortness of breath and wheezing.   Cardiovascular: Negative for chest pain, palpitations, orthopnea, leg swelling and PND.  Gastrointestinal: Negative for abdominal pain, blood in stool, constipation, diarrhea and nausea.  Endocrine: Negative for polydipsia.  Genitourinary: Negative for dysuria, frequency, hematuria and urgency.  Musculoskeletal: Negative for back pain, myalgias and neck pain.  Skin: Negative for rash.  Allergic/Immunologic: Negative for environmental allergies.  Neurological: Negative for dizziness, focal weakness and headaches.  Hematological: Does not bruise/bleed easily.  Psychiatric/Behavioral: Negative for suicidal ideas. The patient is not nervous/anxious.     Patient Active Problem List   Diagnosis Date Noted  . Acute abdominal pain 10/05/2017  . Type 2 diabetes mellitus without complication, without long-term current use of insulin (HCC) 01/30/2017  . Gross hematuria 03/26/2015  . History of elevated PSA 03/26/2015  . Nodular prostate 03/26/2015  . Insect bite 12/08/2014  . Arteriosclerosis of coronary artery 11/29/2014  . AAA (abdominal aortic aneurysm) (HCC) 10/04/2013  . Carotid artery narrowing 10/04/2013  . Dyslipidemia 10/04/2013  . BP (high blood pressure) 10/04/2013  . Excessive urination at night 10/04/2013    No Known Allergies  Past Surgical History:  Procedure Laterality Date  . aneurysm surgery     x 2  . quadrupal bypass      Social History   Tobacco Use  . Smoking status: Former Games developermoker  . Smokeless tobacco: Never Used  . Tobacco comment: quit 20 years  Substance Use Topics  . Alcohol use: No    Alcohol/week: 0.0 standard drinks  . Drug use: No     Medication list has been reviewed and updated.  Current Meds  Medication Sig  . amLODipine (NORVASC) 10 MG tablet Take 1 tablet (10 mg total) by mouth daily at 6 (six) AM.  . aspirin 81 MG  tablet Take 81 mg by mouth daily.  . carvedilol (COREG) 6.25 MG  tablet Take 6.25 mg by mouth 2 (two) times daily with a meal.  . furosemide (LASIX) 40 MG tablet Take 40 mg by mouth.  Marland Kitchen. glipiZIDE (GLUCOTROL) 5 MG tablet TAKE 1 TABLET BY MOUTH TWICE DAILY BEFORE A MEAL  . hydrOXYzine (ATARAX/VISTARIL) 10 MG tablet Take 1 tablet (10 mg total) by mouth 3 (three) times daily as needed.  . isosorbide mononitrate (IMDUR) 30 MG 24 hr tablet Take 1 tablet (30 mg total) by mouth daily at 6 (six) AM.  . lisinopril (PRINIVIL,ZESTRIL) 5 MG tablet Take 1 tablet (5 mg total) by mouth daily.  . metFORMIN (GLUCOPHAGE) 500 MG tablet Take 1 tablet (500 mg total) by mouth 2 (two) times daily with a meal.  . nitroGLYCERIN (NITRODUR - DOSED IN MG/24 HR) 0.2 mg/hr patch UNWRAP AND APPLY 1 PATCH ONCE DAILY, LEAVE ON FOR 12-14 HOURS(WAIT 10-12 HOURS BEFORE NEXT PATCH)  . rosuvastatin (CRESTOR) 10 MG tablet Take 1 tablet by mouth daily.  Marland Kitchen. spironolactone (ALDACTONE) 25 MG tablet Take 1 tablet (25 mg total) by mouth daily.    PHQ 2/9 Scores 04/23/2017 01/30/2017 01/11/2016 11/29/2014  PHQ - 2 Score 0 0 0 0  PHQ- 9 Score 1 - - -    BP Readings from Last 3 Encounters:  10/29/18 120/70  04/16/18 124/80  03/12/18 128/80    Physical Exam Vitals signs and nursing note reviewed.  HENT:     Head: Normocephalic.     Right Ear: External ear normal.     Left Ear: External ear normal.     Nose: Nose normal.  Eyes:     General: No scleral icterus.       Right eye: No discharge.        Left eye: No discharge.     Conjunctiva/sclera: Conjunctivae normal.     Pupils: Pupils are equal, round, and reactive to light.  Neck:     Musculoskeletal: Normal range of motion and neck supple.     Thyroid: No thyromegaly.     Vascular: No JVD.     Trachea: No tracheal deviation.  Cardiovascular:     Rate and Rhythm: Normal rate and regular rhythm.     Pulses:          Carotid pulses are 2+ on the right side and 2+ on the left side.      Radial pulses are 2+ on the right side and 2+ on the left  side.       Femoral pulses are 2+ on the right side and 2+ on the left side.      Popliteal pulses are 2+ on the right side and 2+ on the left side.       Dorsalis pedis pulses are 2+ on the right side and 2+ on the left side.       Posterior tibial pulses are 2+ on the right side and 2+ on the left side.     Heart sounds: Normal heart sounds, S1 normal and S2 normal. No murmur. No systolic murmur. No diastolic murmur. No friction rub. No gallop. No S3 or S4 sounds.   Pulmonary:     Effort: No respiratory distress.     Breath sounds: Normal breath sounds. No wheezing or rales.  Abdominal:     General: Bowel sounds are normal.     Palpations: Abdomen is soft. There is no mass.     Tenderness: There  is no abdominal tenderness. There is no guarding or rebound.  Musculoskeletal: Normal range of motion.        General: No tenderness.     Right lower leg: No edema.     Left lower leg: No edema.  Lymphadenopathy:     Cervical: No cervical adenopathy.  Skin:    General: Skin is warm.     Findings: No rash.  Neurological:     Mental Status: He is alert and oriented to person, place, and time.     Cranial Nerves: No cranial nerve deficit.     Deep Tendon Reflexes: Reflexes are normal and symmetric.     Wt Readings from Last 3 Encounters:  10/29/18 193 lb (87.5 kg)  04/16/18 192 lb (87.1 kg)  03/12/18 192 lb (87.1 kg)    BP 120/70   Pulse 60   Ht 5\' 11"  (1.803 m)   Wt 193 lb (87.5 kg)   BMI 26.92 kg/m   Assessment and Plan: 1. Essential hypertension Chronic.  Controlled.  Continue amlodipine 10 mg once a day lisinopril 5 mg once a day Coreg 6.25 twice a day and furosemide once a day.  Refuses lab. - amLODipine (NORVASC) 10 MG tablet; Take 1 tablet (10 mg total) by mouth daily at 6 (six) AM.  Dispense: 90 tablet; Refill: 1 - lisinopril (ZESTRIL) 5 MG tablet; Take 1 tablet (5 mg total) by mouth daily.  Dispense: 90 tablet; Refill: 1  2. Dermatitis Patient has occasional dermatitis  from exposure to plant like areas desires to have his hydroxyzine refilled on an as-needed basis. - hydrOXYzine (ATARAX/VISTARIL) 10 MG tablet; Take 1 tablet (10 mg total) by mouth 3 (three) times daily as needed.  Dispense: 30 tablet; Refill: 1  3. Arteriosclerosis of coronary artery Patient has had no and stable angina nor episodes of palpitations dyspnea on exertion nor near syncopal episodes.  Patient will continue his Imdur and Coreg and Lasix on an regular basis.  Refill Imdur daily refill Coreg 6.25 twice a day. - isosorbide mononitrate (IMDUR) 30 MG 24 hr tablet; Take 1 tablet (30 mg total) by mouth daily at 6 (six) AM.  Dispense: 90 tablet; Refill: 1  4. Abdominal aortic aneurysm (AAA) without rupture ([place]) Reviewed the CT scan from 619.  Will hold on doing any further investigation right now and that this seems to be stable and patient is reluctant to repeat a CT scan unless there is a need.  5. Coronary artery disease involving coronary bypass graft of native heart with angina pectoris [facility]) Patient is stable with no periods of angina.  6. Noncompliance with diagnostic testing Patient is reluctant to get labs here and says that he will get those when he goes to see endocrinology "[place] the street "and we will not pursue this any further.

## 2018-11-08 ENCOUNTER — Other Ambulatory Visit: Payer: Self-pay

## 2018-11-08 NOTE — Patient Outreach (Signed)
[facility]) Care Management  11/08/2018  Kenneth Mitchell 27-Aug-1933 [id number]   Medication Adherence call to Kenneth Mitchell Hippa Identifiers Verify spoke with patient he is past due on Lisinopril 5 mg patient explain he is taking 1 tablet daily and has medication at this time patient will order when due.Kenneth Mitchell is showing past due under [place].   [place] Management Direct Dial [phone]  Fax [phone] [email or web address]

## 2018-11-30 ENCOUNTER — Telehealth: Payer: Self-pay | Admitting: Family Medicine

## 2018-11-30 NOTE — Chronic Care Management (AMB) (Signed)
°  Chronic Care Management   Outreach Note  11/30/2018 Name: Kenneth Mitchell MRN: [id number] DOB: [birth year]  Referred by: Juline Patch, MD Reason for referral : Chronic Care Management (Third CCM outreach was unsuccessful. )   Third unsuccessful telephone outreach was attempted today. The patient was referred to the case management team for assistance with chronic care management and care coordination. The patient's primary care provider has been notified of our unsuccessful attempts to make or maintain contact with the patient. The care management team is pleased to engage with this patient at any time in the future should he/she be interested in assistance from the care management team.   Follow Up Plan: The care management team is available to follow up with the patient after provider conversation with the patient regarding recommendation for care management engagement and subsequent re-referral to the care management team.   [place]  [email or web address]   ??[phone]

## 2018-12-13 ENCOUNTER — Other Ambulatory Visit: Payer: Self-pay

## 2018-12-13 NOTE — Patient Outreach (Signed)
[facility]) Care Management  12/13/2018  Kenneth Mitchell [birth year] [id number]   Medication Adherence call to [name] Hippa Identifiers Verify spoke with patient he is past due on Lisinopril 5 mg patient explain he did not have any tablets patient ask if we can call [facility] an order this [facility].[name] is showing past due under [place].   [place] Management Direct Dial [phone]  Fax [phone] [email or web address]

## 2018-12-29 ENCOUNTER — Other Ambulatory Visit: Payer: Self-pay

## 2018-12-29 NOTE — Patient Outreach (Signed)
[facility]) Care Management  12/29/2018  Kenneth Mitchell 1933/11/23 [id number]   Medication Adherence call to Mr. Kenneth Mitchell patient did not want to engage patient is showing past due on Rosuvastatin 10 mg under [place].   [place] Management Direct Dial [phone]  Fax [phone] [email or web address]

## 2019-02-27 ENCOUNTER — Encounter: Payer: Self-pay | Admitting: Emergency Medicine

## 2019-02-27 ENCOUNTER — Emergency Department: Payer: Medicare Other

## 2019-02-27 ENCOUNTER — Inpatient Hospital Stay
Admission: EM | Admit: 2019-02-27 | Discharge: 2019-03-01 | DRG: 280 | Disposition: A | Discharge status: Home / Self Care | Payer: Medicare Other | Attending: Internal Medicine | Admitting: Internal Medicine

## 2019-02-27 ENCOUNTER — Other Ambulatory Visit: Payer: Self-pay

## 2019-02-27 ENCOUNTER — Inpatient Hospital Stay: Payer: Medicare Other

## 2019-02-27 DIAGNOSIS — E1151 Type 2 diabetes mellitus with diabetic peripheral angiopathy without gangrene: Secondary | ICD-10-CM | POA: Diagnosis present

## 2019-02-27 DIAGNOSIS — D696 Thrombocytopenia, unspecified: Secondary | ICD-10-CM | POA: Diagnosis present

## 2019-02-27 DIAGNOSIS — I255 Ischemic cardiomyopathy: Secondary | ICD-10-CM | POA: Diagnosis not present

## 2019-02-27 DIAGNOSIS — I214 Non-ST elevation (NSTEMI) myocardial infarction: Secondary | ICD-10-CM | POA: Diagnosis not present

## 2019-02-27 DIAGNOSIS — I25118 Atherosclerotic heart disease of native coronary artery with other forms of angina pectoris: Secondary | ICD-10-CM | POA: Diagnosis not present

## 2019-02-27 DIAGNOSIS — N1831 Chronic kidney disease, stage 3a: Secondary | ICD-10-CM | POA: Diagnosis present

## 2019-02-27 DIAGNOSIS — I2582 Chronic total occlusion of coronary artery: Secondary | ICD-10-CM | POA: Diagnosis not present

## 2019-02-27 DIAGNOSIS — R079 Chest pain, unspecified: Secondary | ICD-10-CM

## 2019-02-27 DIAGNOSIS — N138 Other obstructive and reflux uropathy: Secondary | ICD-10-CM | POA: Diagnosis not present

## 2019-02-27 DIAGNOSIS — I130 Hypertensive heart and chronic kidney disease with heart failure and stage 1 through stage 4 chronic kidney disease, or unspecified chronic kidney disease: Secondary | ICD-10-CM | POA: Diagnosis not present

## 2019-02-27 DIAGNOSIS — R778 Other specified abnormalities of plasma proteins: Secondary | ICD-10-CM | POA: Diagnosis not present

## 2019-02-27 DIAGNOSIS — I160 Hypertensive urgency: Secondary | ICD-10-CM

## 2019-02-27 DIAGNOSIS — I5023 Acute on chronic systolic (congestive) heart failure: Secondary | ICD-10-CM | POA: Diagnosis not present

## 2019-02-27 DIAGNOSIS — I25709 Atherosclerosis of coronary artery bypass graft(s), unspecified, with unspecified angina pectoris: Secondary | ICD-10-CM | POA: Diagnosis present

## 2019-02-27 DIAGNOSIS — I7103 Dissection of thoracoabdominal aorta: Secondary | ICD-10-CM | POA: Diagnosis not present

## 2019-02-27 DIAGNOSIS — I25710 Atherosclerosis of autologous vein coronary artery bypass graft(s) with unstable angina pectoris: Secondary | ICD-10-CM | POA: Diagnosis not present

## 2019-02-27 DIAGNOSIS — E1129 Type 2 diabetes mellitus with other diabetic kidney complication: Secondary | ICD-10-CM | POA: Diagnosis present

## 2019-02-27 DIAGNOSIS — N403 Nodular prostate with lower urinary tract symptoms: Secondary | ICD-10-CM | POA: Diagnosis present

## 2019-02-27 DIAGNOSIS — Z20828 Contact with and (suspected) exposure to other viral communicable diseases: Secondary | ICD-10-CM | POA: Diagnosis present

## 2019-02-27 DIAGNOSIS — E785 Hyperlipidemia, unspecified: Secondary | ICD-10-CM | POA: Diagnosis not present

## 2019-02-27 DIAGNOSIS — Z79899 Other long term (current) drug therapy: Secondary | ICD-10-CM

## 2019-02-27 DIAGNOSIS — R0602 Shortness of breath: Secondary | ICD-10-CM | POA: Diagnosis not present

## 2019-02-27 DIAGNOSIS — K769 Liver disease, unspecified: Secondary | ICD-10-CM | POA: Diagnosis not present

## 2019-02-27 DIAGNOSIS — J449 Chronic obstructive pulmonary disease, unspecified: Secondary | ICD-10-CM | POA: Diagnosis not present

## 2019-02-27 DIAGNOSIS — Z7984 Long term (current) use of oral hypoglycemic drugs: Secondary | ICD-10-CM

## 2019-02-27 DIAGNOSIS — E1122 Type 2 diabetes mellitus with diabetic chronic kidney disease: Secondary | ICD-10-CM | POA: Diagnosis not present

## 2019-02-27 DIAGNOSIS — R7989 Other specified abnormal findings of blood chemistry: Secondary | ICD-10-CM

## 2019-02-27 DIAGNOSIS — Z87891 Personal history of nicotine dependence: Secondary | ICD-10-CM

## 2019-02-27 DIAGNOSIS — I10 Essential (primary) hypertension: Secondary | ICD-10-CM | POA: Diagnosis present

## 2019-02-27 DIAGNOSIS — Z8249 Family history of ischemic heart disease and other diseases of the circulatory system: Secondary | ICD-10-CM

## 2019-02-27 DIAGNOSIS — R319 Hematuria, unspecified: Secondary | ICD-10-CM | POA: Diagnosis not present

## 2019-02-27 DIAGNOSIS — Z7982 Long term (current) use of aspirin: Secondary | ICD-10-CM

## 2019-02-27 DIAGNOSIS — I714 Abdominal aortic aneurysm, without rupture: Secondary | ICD-10-CM | POA: Diagnosis present

## 2019-02-27 DIAGNOSIS — Z833 Family history of diabetes mellitus: Secondary | ICD-10-CM

## 2019-02-27 DIAGNOSIS — I25119 Atherosclerotic heart disease of native coronary artery with unspecified angina pectoris: Secondary | ICD-10-CM | POA: Diagnosis not present

## 2019-02-27 LAB — CBC
HCT: 43.1 % (ref 39.0–52.0)
Hemoglobin: 14.3 g/dL (ref 13.0–17.0)
MCH: 29.9 pg (ref 26.0–34.0)
MCHC: 33.2 g/dL (ref 30.0–36.0)
MCV: 90.2 fL (ref 80.0–100.0)
Platelets: 106 10*3/uL — ABNORMAL LOW (ref 150–400)
RBC: 4.78 MIL/uL (ref 4.22–5.81)
RDW: 13.4 % (ref 11.5–15.5)
WBC: 6.6 10*3/uL (ref 4.0–10.5)
nRBC: 0 % (ref 0.0–0.2)

## 2019-02-27 LAB — BASIC METABOLIC PANEL
Anion gap: 13 (ref 5–15)
BUN: 16 mg/dL (ref 8–23)
CO2: 18 mmol/L — ABNORMAL LOW (ref 22–32)
Calcium: 9.5 mg/dL (ref 8.9–10.3)
Chloride: 110 mmol/L (ref 98–111)
Creatinine, Ser: 1.26 mg/dL — ABNORMAL HIGH (ref 0.61–1.24)
GFR calc Af Amer: >60 mL/min (ref >60)
GFR calc non Af Amer: 52 mL/min — ABNORMAL LOW (ref >60)
Glucose, Bld: 127 mg/dL — ABNORMAL HIGH (ref 70–99)
Potassium: 3.9 mmol/L (ref 3.5–5.1)
Sodium: 141 mmol/L (ref 135–145)

## 2019-02-27 LAB — TROPONIN I (HIGH SENSITIVITY)
Troponin I (High Sensitivity): 6547 ng/L (ref <18)
Troponin I (High Sensitivity): 6163 ng/L (ref <18)

## 2019-02-27 LAB — PROTIME-INR
INR: 1.2 (ref 0.8–1.2)
Prothrombin Time: 15.4 seconds — ABNORMAL HIGH (ref 11.4–15.2)

## 2019-02-27 LAB — BRAIN NATRIURETIC PEPTIDE: B Natriuretic Peptide: 1846 pg/mL — ABNORMAL HIGH (ref 0.0–100.0)

## 2019-02-27 LAB — APTT: aPTT: 43 seconds — ABNORMAL HIGH (ref 24–36)

## 2019-02-27 MED ORDER — ALUM & MAG HYDROXIDE-SIMETH 200-200-20 MG/5ML PO SUSP: 30.0000 mL | Freq: Once | ORAL | Status: DC

## 2019-02-27 MED ORDER — PANTOPRAZOLE SODIUM 40 MG PO TBEC
40.0000 mg | DELAYED_RELEASE_TABLET | Freq: Every day | ORAL | Status: DC
  Administered 2019-02-28 – 2019-03-01 (×2): 40 mg via ORAL
  Filled 2019-02-27 (×2): qty 1

## 2019-02-27 MED ORDER — HEPARIN (PORCINE) 25000 UT/250ML-% IV SOLN
1200.0000 [IU]/h | INTRAVENOUS | Status: DC
  Administered 2019-02-27: 22:00:00 1200 [IU]/h via INTRAVENOUS
  Filled 2019-02-27: qty 250

## 2019-02-27 MED ORDER — INSULIN ASPART 100 UNIT/ML ~~LOC~~ SOLN
0.0000 [IU] | Freq: Three times a day (TID) | SUBCUTANEOUS | Status: DC
  Administered 2019-03-01: 12:00:00 2 [IU] via SUBCUTANEOUS
  Filled 2019-02-27: qty 1

## 2019-02-27 MED ORDER — AMLODIPINE BESYLATE 10 MG PO TABS
10.0000 mg | ORAL_TABLET | Freq: Every day | ORAL | Status: DC
  Administered 2019-02-28 – 2019-03-01 (×2): 10 mg via ORAL
  Filled 2019-02-27 (×2): qty 1

## 2019-02-27 MED ORDER — HYDROXYZINE HCL 25 MG PO TABS: 25.0000 mg | ORAL_TABLET | Freq: Three times a day (TID) | ORAL | Status: DC | PRN

## 2019-02-27 MED ORDER — NITROGLYCERIN 0.4 MG SL SUBL
0.4000 mg | SUBLINGUAL_TABLET | SUBLINGUAL | Status: DC | PRN
  Administered 2019-02-28: 04:00:00 0.4 mg via SUBLINGUAL
  Filled 2019-02-27 (×2): qty 1

## 2019-02-27 MED ORDER — SODIUM CHLORIDE 0.9 % IV SOLN
INTRAVENOUS | Status: DC
  Administered 2019-02-27: 22:00:00 via INTRAVENOUS

## 2019-02-27 MED ORDER — ONDANSETRON HCL 4 MG/2ML IJ SOLN: 4.0000 mg | Freq: Four times a day (QID) | INTRAMUSCULAR | Status: DC | PRN

## 2019-02-27 MED ORDER — ISOSORBIDE MONONITRATE ER 30 MG PO TB24
30.0000 mg | ORAL_TABLET | Freq: Every day | ORAL | Status: DC
  Administered 2019-02-28: 05:00:00 30 mg via ORAL
  Filled 2019-02-27: qty 1

## 2019-02-27 MED ORDER — FUROSEMIDE 10 MG/ML IJ SOLN
40.0000 mg | Freq: Once | INTRAMUSCULAR | Status: AC
  Administered 2019-02-27: 22:00:00 40 mg via INTRAVENOUS
  Filled 2019-02-27: qty 4

## 2019-02-27 MED ORDER — ASPIRIN 81 MG PO CHEW
324.0000 mg | CHEWABLE_TABLET | Freq: Once | ORAL | Status: AC
  Administered 2019-02-27: 324 mg via ORAL
  Filled 2019-02-27: qty 4

## 2019-02-27 MED ORDER — METOPROLOL TARTRATE 5 MG/5ML IV SOLN
2.5000 mg | Freq: Four times a day (QID) | INTRAVENOUS | Status: DC
  Administered 2019-02-27: 23:00:00 2.5 mg via INTRAVENOUS
  Filled 2019-02-27 (×2): qty 5

## 2019-02-27 MED ORDER — ASPIRIN EC 325 MG PO TBEC
325.0000 mg | DELAYED_RELEASE_TABLET | Freq: Every day | ORAL | Status: DC
  Administered 2019-02-28 – 2019-03-01 (×2): 325 mg via ORAL
  Filled 2019-02-27 (×2): qty 1

## 2019-02-27 MED ORDER — FUROSEMIDE 40 MG PO TABS
40.0000 mg | ORAL_TABLET | Freq: Every day | ORAL | Status: DC
  Administered 2019-02-27: 22:00:00 40 mg via ORAL
  Filled 2019-02-27: qty 1

## 2019-02-27 MED ORDER — NITROGLYCERIN 0.2 MG/HR TD PT24
0.2000 mg | MEDICATED_PATCH | Freq: Every day | TRANSDERMAL | Status: DC
  Administered 2019-02-27 – 2019-02-28 (×2): 0.2 mg via TRANSDERMAL
  Filled 2019-02-27 (×3): qty 1

## 2019-02-27 MED ORDER — ACETAMINOPHEN 325 MG PO TABS
650.0000 mg | ORAL_TABLET | ORAL | Status: DC | PRN
  Administered 2019-02-28: 10:00:00 650 mg via ORAL
  Filled 2019-02-27: qty 2

## 2019-02-27 MED ORDER — LISINOPRIL 5 MG PO TABS
5.0000 mg | ORAL_TABLET | Freq: Every day | ORAL | Status: DC
  Administered 2019-02-27 – 2019-03-01 (×3): 5 mg via ORAL
  Filled 2019-02-27 (×3): qty 1

## 2019-02-27 MED ORDER — SPIRONOLACTONE 25 MG PO TABS
25.0000 mg | ORAL_TABLET | Freq: Every day | ORAL | Status: DC
  Administered 2019-02-28 – 2019-03-01 (×2): 25 mg via ORAL
  Filled 2019-02-27 (×2): qty 1

## 2019-02-27 MED ORDER — CARVEDILOL 6.25 MG PO TABS
6.2500 mg | ORAL_TABLET | Freq: Two times a day (BID) | ORAL | Status: DC
  Administered 2019-02-28 – 2019-03-01 (×2): 6.25 mg via ORAL
  Filled 2019-02-27 (×2): qty 1

## 2019-02-27 MED ORDER — FUROSEMIDE 10 MG/ML IJ SOLN
40.0000 mg | Freq: Two times a day (BID) | INTRAMUSCULAR | Status: DC
  Administered 2019-02-28 – 2019-03-01 (×3): 40 mg via INTRAVENOUS
  Filled 2019-02-27 (×3): qty 4

## 2019-02-27 MED ORDER — ALPRAZOLAM 0.25 MG PO TABS
0.2500 mg | ORAL_TABLET | Freq: Two times a day (BID) | ORAL | Status: DC | PRN
  Administered 2019-02-28: 10:00:00 0.25 mg via ORAL
  Filled 2019-02-27 (×2): qty 1

## 2019-02-27 MED ORDER — ZOLPIDEM TARTRATE 5 MG PO TABS: 5.0000 mg | ORAL_TABLET | Freq: Every evening | ORAL | Status: DC | PRN

## 2019-02-27 MED ORDER — ROSUVASTATIN CALCIUM 10 MG PO TABS
10.0000 mg | ORAL_TABLET | Freq: Every day | ORAL | Status: DC
  Administered 2019-02-28 – 2019-03-01 (×2): 10 mg via ORAL
  Filled 2019-02-27 (×3): qty 1

## 2019-02-27 MED ORDER — HEPARIN BOLUS VIA INFUSION
4000.0000 [IU] | Freq: Once | INTRAVENOUS | Status: AC
  Administered 2019-02-27: 22:00:00 4000 [IU] via INTRAVENOUS
  Filled 2019-02-27: qty 4000

## 2019-02-27 MED ORDER — LIDOCAINE VISCOUS HCL 2 % MT SOLN
15.0000 mL | Freq: Once | OROMUCOSAL | Status: DC
  Filled 2019-02-27: qty 15

## 2019-02-27 NOTE — ED Notes (Signed)
Pt acting upset at alarms going off. Wants monitors off. Pt encouraged to leave monitors and tubes on. Pt reassured and acting more calm.

## 2019-02-27 NOTE — Progress Notes (Signed)
ANTICOAGULATION CONSULT NOTE - Initial Consult  Pharmacy Consult for Heparin  Indication: chest pain/ACS  No Known Allergies  Patient Measurements: Height: 5\' 9"  (175.3 cm) Weight: 190 lb (86.2 kg) IBW/kg (Calculated) : 70.7 Heparin Dosing Weight: 86.2 kg   Vital Signs: BP: 149/120 (11/08 2030) Pulse Rate: 74 (11/08 2030)  Labs: Recent Labs    02/27/19 1827 02/27/19 1942  HGB 14.3  --   HCT 43.1  --   PLT 106*  --   APTT  --  43*  LABPROT  --  15.4*  INR  --  1.2  CREATININE 1.26*  --   TROPONINIHS 6,547*  --     Estimated Creatinine Clearance: 48.3 mL/min (A) (by C-G formula based on SCr of 1.26 mg/dL (H)).   Medical History: Past Medical History:  Diagnosis Date  . CAD (coronary artery disease)   . Diabetes mellitus ([place])   . Gross hematuria   . Hyperlipidemia   . Hypertension   . Inguinal hernia   . Nodular prostate   . Syncope     Medications:  (Not in a hospital admission)   Assessment: Pharmacy consulted to dose heparin in this 85 year old male admitted with ACS/NSTEMI.  No prior anticoag noted.  CrCl = 48.3 ml/min  Goal of Therapy:  Heparin level 0.3-0.7 units/ml Monitor platelets by anticoagulation protocol: Yes   Plan:  Give 4000 units bolus x 1 Start heparin infusion at 1200 units/hr Check anti-Xa level in 8 hours and daily while on heparin Continue to monitor H&H and platelets  Channing Yeager D 02/27/2019,8:41 PM

## 2019-02-27 NOTE — H&P (Addendum)
Kenneth Mitchell at [facility]   PATIENT NAME: Kenneth Mitchell    MR#:  [id number]  DATE OF BIRTH:  1934-01-17  DATE OF ADMISSION:  02/27/2019  PRIMARY CARE PHYSICIAN: Duanne Limerick, MD   REQUESTING/REFERRING PHYSICIAN: Phineas Semen, MD  CHIEF COMPLAINT:   Chief Complaint  Patient presents with  . Shortness of Breath  . Chest Pain    HISTORY OF PRESENT ILLNESS:  Kenneth Mitchell  is a 85 y.o.Caucasian male with a known history of coronary disease status post four-vessel CABG, type 2 diabetes mellitus, dyslipidemia and hypertension.  Who presented to the emergency room with acute onset of midsternal chest pain felt as pressure and tightness with agitated dyspnea which has been intermittent for a while.  The patient could not sleep last night.  He denies any nausea or vomiting or diaphoresis.  He graded his pain as 5/10 in severity.  No cough or wheezing or hemoptysis.  He denies any palpitations.  No fever or chills or recent sick exposures.  Upon presentation to the emergency room, blood pressure was 167/115 with respiratory to 24 and otherwise normal vital signs.  Labs were remarkable for a creatinine of 1.26 and a significantly elevated high-sensitivity troponin I of 6547 with  thrombocytopenia with platelets of 106 otherwise normal CBC.  Two-view chest ray showed mild cardiomegaly with findings suggestive of COPD with no acute cardiopulmonary disease.  The patient was given 4 baby aspirin and 1 sublingual nitroglycerin.  He will be admitted to a telemetry bed for further evaluation and management. PAST MEDICAL HISTORY:   Past Medical History:  Diagnosis Date  . CAD (coronary artery disease)   . Diabetes mellitus (HCC)   . Gross hematuria   . Hyperlipidemia   . Hypertension   . Inguinal hernia   . Nodular prostate   . Syncope     PAST SURGICAL HISTORY:   Past Surgical History:  Procedure Laterality Date  . aneurysm surgery     x 2  . quadrupal bypass      SOCIAL  HISTORY:   Social History   Tobacco Use  . Smoking status: Former Games developer  . Smokeless tobacco: Never Used  . Tobacco comment: quit 20 years  Substance Use Topics  . Alcohol use: No    Alcohol/week: 0.0 standard drinks    FAMILY HISTORY:   Family History  Problem Relation Age of Onset  . Diabetes Mother   . Heart disease Father   . Stroke Father   . Cancer Brother   . Kidney disease Neg Hx   . Prostate cancer Neg Hx     DRUG ALLERGIES:  No Known Allergies  REVIEW OF SYSTEMS:   ROS As per history of present illness. All pertinent systems were reviewed above. Constitutional,  HEENT, cardiovascular, respiratory, GI, GU, musculoskeletal, neuro, psychiatric, endocrine,  integumentary and hematologic systems were reviewed and are otherwise  negative/unremarkable except for positive findings mentioned above in the HPI.   MEDICATIONS AT HOME:   Prior to Admission medications   Medication Sig Start Date End Date Taking? Authorizing Provider  amLODipine (NORVASC) 10 MG tablet Take 1 tablet (10 mg total) by mouth daily at 6 (six) AM. 10/29/18   Duanne Limerick, MD  aspirin 81 MG tablet Take 81 mg by mouth daily.    [provider]  carvedilol (COREG) 6.25 MG tablet Take 1 tablet (6.25 mg total) by mouth 2 (two) times daily with a meal. 10/29/18   Duanne Limerick,  MD  furosemide (LASIX) 40 MG tablet Take 1 tablet (40 mg total) by mouth daily. 10/29/18   Duanne LimerickJones, Deanna C, MD  glipiZIDE (GLUCOTROL) 5 MG tablet TAKE 1 TABLET BY MOUTH TWICE DAILY BEFORE A MEAL 04/16/18   Duanne LimerickJones, Deanna C, MD  hydrOXYzine (ATARAX/VISTARIL) 10 MG tablet Take 1 tablet (10 mg total) by mouth 3 (three) times daily as needed. 10/29/18   Duanne LimerickJones, Deanna C, MD  isosorbide mononitrate (IMDUR) 30 MG 24 hr tablet Take 1 tablet (30 mg total) by mouth daily at 6 (six) AM. 10/29/18   Duanne LimerickJones, Deanna C, MD  lisinopril (ZESTRIL) 5 MG tablet Take 1 tablet (5 mg total) by mouth daily. 10/29/18 10/29/19  Duanne LimerickJones, Deanna C, MD   metFORMIN (GLUCOPHAGE) 500 MG tablet Take 1 tablet (500 mg total) by mouth 2 (two) times daily with a meal. 04/16/18   Duanne LimerickJones, Deanna C, MD  nitroGLYCERIN (NITRODUR - DOSED IN MG/24 HR) 0.2 mg/hr patch UNWRAP AND APPLY 1 PATCH ONCE DAILY, LEAVE ON FOR 12-14 HOURS(WAIT 10-12 HOURS BEFORE NEXT PATCH) 01/30/17   Duanne LimerickJones, Deanna C, MD  rosuvastatin (CRESTOR) 10 MG tablet Take 1 tablet (10 mg total) by mouth daily. 10/29/18   Duanne LimerickJones, Deanna C, MD  spironolactone (ALDACTONE) 25 MG tablet Take 1 tablet (25 mg total) by mouth daily. 10/12/18   Duanne LimerickJones, Deanna C, MD      VITAL SIGNS:  Blood pressure (!) 149/120, pulse 74, resp. rate 17, height 5\' 9"  (1.753 m), weight 86.2 kg, SpO2 93 %.  PHYSICAL EXAMINATION:  Physical Exam  GENERAL:  85 y.o.-year-old Caucasian male patient lying in the bed in mild respiratory distress with conversational dyspnea EYES: Pupils equal, round, reactive to light and accommodation. No scleral icterus. Extraocular muscles intact.  HEENT: Head atraumatic, normocephalic. Oropharynx and nasopharynx clear.  NECK:  Supple, no jugular venous distention. No thyroid enlargement, no tenderness.  LUNGS: Diminished bibasilar breath sounds with bibasal rales CARDIOVASCULAR: Regular rate and rhythm, S1, S2 normal. No murmurs, rubs, or gallops.  ABDOMEN: Soft, nondistended, nontender. Bowel sounds present. No organomegaly or mass.  EXTREMITIES: 1-2+ bilateral lower extremity pitting edema with no cyanosis, or clubbing.  NEUROLOGIC: Cranial nerves II through XII are intact. Muscle strength 5/5 in all extremities. Sensation intact. Gait not checked.  PSYCHIATRIC: The patient is alert and oriented x 3.  Normal affect and good eye contact. SKIN: No obvious rash, lesion, or ulcer.   LABORATORY PANEL:   CBC Recent Labs  Lab 02/27/19 1827  WBC 6.6  HGB 14.3  HCT 43.1  PLT 106*   ------------------------------------------------------------------------------------------------------------------   Chemistries  Recent Labs  Lab 02/27/19 1827  NA 141  K 3.9  CL 110  CO2 18*  GLUCOSE 127*  BUN 16  CREATININE 1.26*  CALCIUM 9.5   ------------------------------------------------------------------------------------------------------------------  Cardiac Enzymes No results for input(s): TROPONINI in the last 168 hours. ------------------------------------------------------------------------------------------------------------------  RADIOLOGY:  Dg Chest 2 View  Result Date: 02/27/2019 CLINICAL DATA:  Chest pain and shortness of breath EXAM: CHEST - 2 VIEW COMPARISON:  03/11/2012 FINDINGS: Mild cardiomegaly. Status post CABG. The lungs are mildly hyperexpanded with diffusely increased interstitial markings. No pleural effusion or pneumothorax. No focal airspace consolidation or pulmonary edema. IMPRESSION: No active cardiopulmonary disease. Mild cardiomegaly and findings suggestive of COPD. Electronically Signed   By: Deatra RobinsonKevin  Herman M.D.   On: 02/27/2019 19:06      IMPRESSION AND PLAN:   1.  Non-ST elevation MI.  The patient will be admitted to telemetry bed.  We  will follow further high-sensitivity troponin I levels.  Will obtain a 2D echo and a cardiology consultation in a.m.  He will be continued on enteric-coated aspirin 325 mg p.o. daily as well as statin therapy and nitrates as well as IV heparin drip after bolus..  Beta-blocker therapy with Coreg will be continued.  Dr. Saralyn Pilar was notified about the patient..  2.  Dyspnea with lower extremity edema with associated significantly elevated BNP, concerning for new onset acute CHF probably diastolic.  The patient will be diuresed with IV Lasix.  Will follow serial troponin I's.  A 2D echo and a cardiology consultation will be obtained as mentioned above.  We will obviously stop IV fluids as BNP came back significant elevated.  We will check a chest CTA to rule out PE and assess for aortic pathology.  3.  Coronary artery disease  status post four-vessel CABG.  Management as above.  4.  Hypertensive urgency.  Continue Coreg, amlodipine as well as Imdur and Zestril.  The patient will be placed on as needed IV Lopressor.  This could be contributing to new onset acute CHF.  5  Type 2 diabetes mellitus.  The patient will be placed on supplemental coverage with NovoLog and will hold off glipizide.  6.  Dyslipidemia.  Statin therapy will be resumed  7.  DVT prophylaxis.  The patient will be on IV heparin drip.  GI prophylaxis with p.o. Protonix given current anticoagulation.    All the records are reviewed and case discussed with ED provider. The plan of care was discussed in details with the patient (and family). I answered all questions. The patient agreed to proceed with the above mentioned plan. Further management will depend upon hospital course.   CODE STATUS: Full code  TOTAL TIME TAKING CARE OF THIS PATIENT: 60 minutes.    Christel Mormon M.D on 02/27/2019 at 8:50 PM  [facility]   From 7 PM-7 AM, contact night-coverage [email or web address]  CC: Primary care physician; Juline Patch, MD   Note: This dictation was prepared with Dragon dictation along with smaller phrase technology. Any transcriptional errors that result from this process are unintentional.

## 2019-02-27 NOTE — ED Notes (Signed)
Pt denies chest pain at present. Pt denies chest pain since arrival in room

## 2019-02-27 NOTE — ED Provider Notes (Signed)
[facility] Emergency Department Provider Note  ____________________________________________   I have reviewed the triage vital signs and the nursing notes.   HISTORY  Chief Complaint Shortness of Breath and Chest Pain   History limited by: Not Limited   HPI Kenneth Mitchell is a 85 y.o. male who presents to the emergency department today because of concern for chest pain and shortness of breath. The patient states that his symptoms started last night. The pain is located in the center part of his chest. The symptoms did keep him up overnight. At the time of my exam he says that the pain has resolved. The patient does state that he has had similar pain on and off for the past few years. Last had a catheterization done roughly 1 year ago. He states no stents were placed at that time. Denies any fevers.    Records reviewed. Per medical record review patient has a history of CAD, CABG. Catheterization done 10 months ago at [facility].   Past Medical History:  Diagnosis Date  . CAD (coronary artery disease)   . Diabetes mellitus ([place])   . Gross hematuria   . Hyperlipidemia   . Hypertension   . Inguinal hernia   . Nodular prostate   . Syncope     Patient Active Problem List   Diagnosis Date Noted  . Coronary artery disease involving coronary bypass graft of native heart with angina pectoris ([place]) 10/29/2018  . Acute abdominal pain 10/05/2017  . Type 2 diabetes mellitus without complication, without long-term current use of insulin ([place]) 01/30/2017  . Gross hematuria 03/26/2015  . History of elevated PSA 03/26/2015  . Nodular prostate 03/26/2015  . Insect bite 12/08/2014  . Arteriosclerosis of coronary artery 11/29/2014  . AAA (abdominal aortic aneurysm) ([place]) 10/04/2013  . Carotid artery narrowing 10/04/2013  . Dyslipidemia 10/04/2013  . BP (high blood pressure) 10/04/2013  . Excessive urination at night 10/04/2013    Past Surgical History:   Procedure Laterality Date  . aneurysm surgery     x 2  . quadrupal bypass      Prior to Admission medications   Medication Sig Start Date End Date Taking? Authorizing Provider  amLODipine (NORVASC) 10 MG tablet Take 1 tablet (10 mg total) by mouth daily at 6 (six) AM. 10/29/18   Juline Patch, MD  aspirin 81 MG tablet Take 81 mg by mouth daily.    [provider]  carvedilol (COREG) 6.25 MG tablet Take 1 tablet (6.25 mg total) by mouth 2 (two) times daily with a meal. 10/29/18   Juline Patch, MD  furosemide (LASIX) 40 MG tablet Take 1 tablet (40 mg total) by mouth daily. 10/29/18   Juline Patch, MD  glipiZIDE (GLUCOTROL) 5 MG tablet TAKE 1 TABLET BY MOUTH TWICE DAILY BEFORE A MEAL 04/16/18   Juline Patch, MD  hydrOXYzine (ATARAX/VISTARIL) 10 MG tablet Take 1 tablet (10 mg total) by mouth 3 (three) times daily as needed. 10/29/18   Juline Patch, MD  isosorbide mononitrate (IMDUR) 30 MG 24 hr tablet Take 1 tablet (30 mg total) by mouth daily at 6 (six) AM. 10/29/18   Juline Patch, MD  lisinopril (ZESTRIL) 5 MG tablet Take 1 tablet (5 mg total) by mouth daily. 10/29/18 10/29/19  Juline Patch, MD  metFORMIN (GLUCOPHAGE) 500 MG tablet Take 1 tablet (500 mg total) by mouth 2 (two) times daily with a meal. 04/16/18   Juline Patch, MD  nitroGLYCERIN (NITRODUR - DOSED IN MG/24 HR) 0.2 mg/hr patch UNWRAP AND APPLY 1 PATCH ONCE DAILY, LEAVE ON FOR 12-14 HOURS(WAIT 10-12 HOURS BEFORE NEXT PATCH) 01/30/17   Duanne LimerickJones, Deanna C, MD  rosuvastatin (CRESTOR) 10 MG tablet Take 1 tablet (10 mg total) by mouth daily. 10/29/18   Duanne LimerickJones, Deanna C, MD  spironolactone (ALDACTONE) 25 MG tablet Take 1 tablet (25 mg total) by mouth daily. 10/12/18   Duanne LimerickJones, Deanna C, MD    Allergies Patient has no known allergies.  Family History  Problem Relation Age of Onset  . Diabetes Mother   . Heart disease Father   . Stroke Father   . Cancer Brother   . Kidney disease Neg Hx   . Prostate cancer Neg Hx      Social History Social History   Tobacco Use  . Smoking status: Former Games developermoker  . Smokeless tobacco: Never Used  . Tobacco comment: quit 20 years  Substance Use Topics  . Alcohol use: No    Alcohol/week: 0.0 standard drinks  . Drug use: No    Review of Systems Constitutional: No fever/chills Eyes: No visual changes. ENT: No sore throat. Cardiovascular: Positive for chest pain. Respiratory: Positive for shortness of breath. Gastrointestinal: No abdominal pain.  No nausea, no vomiting.  No diarrhea.   Genitourinary: Negative for dysuria. Musculoskeletal: Negative for back pain. Skin: Negative for rash. Neurological: Negative for headaches, focal weakness or numbness.  ____________________________________________   PHYSICAL EXAM:  VITAL SIGNS: ED Triage Vitals [02/27/19 1816]  Enc Vitals Group     BP (!) 167/115     Pulse Rate 81     Resp (!) 24     Temp      Temp src      SpO2 95 %     Weight      Height      Head Circumference      Peak Flow      Pain Score 0   Constitutional: Alert and oriented.  Eyes: Conjunctivae are normal.  ENT      Head: Normocephalic and atraumatic.      Nose: No congestion/rhinnorhea.      Mouth/Throat: Mucous membranes are moist.      Neck: No stridor. Hematological/Lymphatic/Immunilogical: No cervical lymphadenopathy. Cardiovascular: Normal rate, regular rhythm.  No murmurs, rubs, or gallops.  Respiratory: Slightly tachypneic. Breath sounds are clear and equal bilaterally. No wheezes/rales/rhonchi. Gastrointestinal: Soft and non tender. No rebound. No guarding.  Genitourinary: Deferred Musculoskeletal: Normal range of motion in all extremities. No lower extremity edema. Neurologic:  Normal speech and language. No gross focal neurologic deficits are appreciated.  Skin:  Skin is warm, dry and intact. No rash noted. Psychiatric: Mood and affect are normal. Speech and behavior are normal. Patient exhibits appropriate insight and  judgment.  ____________________________________________    LABS (pertinent positives/negatives)  Trop 6547 CBC wbc 6.6, hgb 14.3, plt 106 BMP wnl except co2 18, glu 127, cr .1.26  ____________________________________________   EKG  I, Phineas SemenGraydon Netha Dafoe, attending physician, personally viewed and interpreted this EKG  EKG Time: 1802 Rate: 76 Rhythm: sinus rhythm with pac Axis: left axis deviation Intervals: qtc 495 QRS: RBBB ST changes: no st elevation Impression: abnormal ekg  ____________________________________________    RADIOLOGY  CXR No active cardiopulmonary disease  ____________________________________________   PROCEDURES  Procedures  CRITICAL CARE Performed by: Phineas SemenGraydon Branton Einstein   Total critical care time: 30 minutes  Critical care time was exclusive of separately billable procedures and treating other  patients.  Critical care was necessary to treat or prevent imminent or life-threatening deterioration.  Critical care was time spent personally by me on the following activities: development of treatment plan with patient and/or surrogate as well as nursing, discussions with consultants, evaluation of patient's response to treatment, examination of patient, obtaining history from patient or surrogate, ordering and performing treatments and interventions, ordering and review of laboratory studies, ordering and review of radiographic studies, pulse oximetry and re-evaluation of patient's condition.  ____________________________________________   INITIAL IMPRESSION / ASSESSMENT AND PLAN / ED COURSE  Pertinent labs & imaging results that were available during my care of the patient were reviewed by me and considered in my medical decision making (see chart for details).   Patient with history of CABG and coronary artery disease who presents to the emergency department today because of concerns for chest pain and shortness of breath. EKG is abnormal however  no STEMI. Troponin did come back elevated at greater than 6000. Because of this I did have concern for ACS. Patient was given aspirin and heparin was ordered. Discussed the finding with the patient. Discussed importance of admission.    ____________________________________________   FINAL CLINICAL IMPRESSION(S) / ED DIAGNOSES  Final diagnoses:  Chest pain, unspecified type  Shortness of breath  Elevated troponin     Note: This dictation was prepared with Dragon dictation. Any transcriptional errors that result from this process are unintentional     Phineas Semen, MD 02/27/19 2141

## 2019-02-27 NOTE — ED Notes (Signed)
ED TO INPATIENT HANDOFF REPORT  ED Nurse Name and Phone #: Marisue Humble [phone]  S Name/Age/Gender Kenneth Mitchell 85 y.o. male Room/Bed: ED01A/ED01A  Code Status   Code Status: Full Code  Home/SNF/Other Home Patient oriented to: self, place, time and situation Is this baseline? Yes   Triage Complete: Triage complete  Chief Complaint difficulty breathing  Triage Note Pt arrived via POV with reports of chest pain and shortness of breath. Pt reports CP started years ago, but has been intermittent more frequently. Pt has hx of CABG x 4.  Pt states shortness of breath started today.  Pt denies any pain at this time. Denies any COVID contacts.  Reports non-productive cough.   Pt c/o orthopnea.    Allergies No Known Allergies  Level of Care/Admitting Diagnosis ED Disposition    ED Disposition Condition Comment   Admit  Hospital Area: [facility] [[id number]]  Level of Care: Telemetry [5]  Covid Evaluation: Asymptomatic Screening Protocol (No Symptoms)  Diagnosis: NSTEMI (non-ST elevated myocardial infarction) [facility]) [[id number]]  Admitting Physician: Hannah Beat [[id number]]  Attending Physician: Hannah Beat [[id number]]  Estimated length of stay: past midnight tomorrow  Certification:: I certify this patient will need inpatient services for at least 2 midnights  PT Class (Do Not Modify): Inpatient [101]  PT Acc Code (Do Not Modify): Private [1]       B Medical/Surgery History Past Medical History:  Diagnosis Date  . CAD (coronary artery disease)   . Diabetes mellitus (HCC)   . Gross hematuria   . Hyperlipidemia   . Hypertension   . Inguinal hernia   . Nodular prostate   . Syncope    Past Surgical History:  Procedure Laterality Date  . aneurysm surgery     x 2  . quadrupal bypass       A IV Location/Drains/Wounds Patient Lines/Drains/Airways Status   Active Line/Drains/Airways    Name:   Placement date:   Placement time:   Site:   Days:   Peripheral  IV 02/27/19 Left Wrist   02/27/19    1941    Wrist   less than 1   Peripheral IV 02/27/19 Left Antecubital   02/27/19    1956    Antecubital   less than 1          Intake/Output Last 24 hours No intake or output data in the 24 hours ending 02/27/19 2211  Labs/Imaging Results for orders placed or performed during the hospital encounter of 02/27/19 (from the past 48 hour(s))  Basic metabolic panel     Status: Abnormal   Collection Time: 02/27/19  6:27 PM  Result Value Ref Range   Sodium 141 135 - 145 mmol/L   Potassium 3.9 3.5 - 5.1 mmol/L   Chloride 110 98 - 111 mmol/L   CO2 18 (L) 22 - 32 mmol/L   Glucose, Bld 127 (H) 70 - 99 mg/dL   BUN 16 8 - 23 mg/dL   Creatinine, Ser 6.56 (H) 0.61 - 1.24 mg/dL   Calcium 9.5 8.9 - 81.2 mg/dL   GFR calc non Af Amer 52 (L) >60 mL/min   GFR calc Af Amer >60 >60 mL/min   Anion gap 13 5 - 15    Comment: Performed at [facility], [address] [place], [place] [address]  CBC     Status: Abnormal   Collection Time: 02/27/19  6:27 PM  Result Value Ref Range   WBC 6.6 4.0 - 10.5  K/uL   RBC 4.78 4.22 - 5.81 MIL/uL   Hemoglobin 14.3 13.0 - 17.0 g/dL   HCT 43.1 39.0 - 52.0 %   MCV 90.2 80.0 - 100.0 fL   MCH 29.9 26.0 - 34.0 pg   MCHC 33.2 30.0 - 36.0 g/dL   RDW 13.4 11.5 - 15.5 %   Platelets 106 (L) 150 - 400 K/uL    Comment: Immature Platelet Fraction may be clinically indicated, consider ordering this additional test [id number]    nRBC 0.0 0.0 - 0.2 %    Comment: Performed at [facility], [address] [place], [place] [address]  Troponin I (High Sensitivity)     Status: Abnormal   Collection Time: 02/27/19  6:27 PM  Result Value Ref Range   Troponin I (High Sensitivity) 6,547 (HH) <18 ng/L    Comment: CRITICAL RESULT CALLED TO, READ BACK BY AND VERIFIED WITH ANGELA WHITESELL @1903  02/27/19 AKT (NOTE) Elevated high sensitivity troponin I (hsTnI) values and significant  changes across serial measurements may suggest  ACS but many other  chronic and acute conditions are known to elevate hsTnI results.  Refer to the "Links" section for chest pain algorithms and additional  guidance. Performed at [facility], [address] [place], [place] [address]   Brain natriuretic peptide     Status: Abnormal   Collection Time: 02/27/19  6:27 PM  Result Value Ref Range   B Natriuretic Peptide 1,846.0 (H) 0.0 - 100.0 pg/mL    Comment: Performed at [facility], [address] [place], [place] [address]  Protime-INR     Status: Abnormal   Collection Time: 02/27/19  7:42 PM  Result Value Ref Range   Prothrombin Time 15.4 (H) 11.4 - 15.2 seconds   INR 1.2 0.8 - 1.2    Comment: (NOTE) INR goal varies based on device and disease states. Performed at [facility], [address] [place], [place] [address]   APTT     Status: Abnormal   Collection Time: 02/27/19  7:42 PM  Result Value Ref Range   aPTT 43 (H) 24 - 36 seconds    Comment:        IF BASELINE aPTT IS ELEVATED, SUGGEST PATIENT RISK ASSESSMENT BE USED TO DETERMINE APPROPRIATE ANTICOAGULANT THERAPY. Performed at [facility], [place], [place] [address]   Troponin I (High Sensitivity)     Status: Abnormal   Collection Time: 02/27/19  8:21 PM  Result Value Ref Range   Troponin I (High Sensitivity) 6,163 (HH) <18 ng/L    Comment: CRITICAL VALUE NOTED. VALUE IS CONSISTENT WITH PREVIOUSLY REPORTED/CALLED VALUE AKT (NOTE) Elevated high sensitivity troponin I (hsTnI) values and significant  changes across serial measurements may suggest ACS but many other  chronic and acute conditions are known to elevate hsTnI results.  Refer to the "Links" section for chest pain algorithms and additional  guidance. Performed at [facility], [address] [place], [place] [address]    Dg Chest 2 View  Result Date: 02/27/2019 CLINICAL DATA:  Chest pain and shortness of breath EXAM: CHEST - 2  VIEW COMPARISON:  03/11/2012 FINDINGS: Mild cardiomegaly. Status post CABG. The lungs are mildly hyperexpanded with diffusely increased interstitial markings. No pleural effusion or pneumothorax. No focal airspace consolidation or pulmonary edema. IMPRESSION: No active cardiopulmonary disease. Mild cardiomegaly and findings suggestive of COPD. Electronically Signed   By: Ulyses Jarred M.D.   On: 02/27/2019 19:06    Pending  Labs [employer] (From admission, onward)    Start     Ordered   02/28/19 0600  Heparin level (unfractionated)  Once-Timed,   STAT     02/27/19 2148   02/27/19 2128  Hemoglobin A1c  Once,   STAT    Comments: To assess prior glycemic control    02/27/19 2127   02/27/19 2048  SARS CORONAVIRUS 2 (TAT 6-24 HRS) Nasopharyngeal Nasopharyngeal Swab  (Asymptomatic/Tier 2 Patients Labs)  Once,   STAT    Question Answer Comment  Is this test for diagnosis or screening Screening   Symptomatic for COVID-19 as defined by CDC No   Hospitalized for COVID-19 No   Admitted to ICU for COVID-19 No   Previously tested for COVID-19 No   Resident in a congregate (group) care setting No   Employed in healthcare setting No      02/27/19 2049          Vitals/Pain Today's Vitals   02/27/19 2100 02/27/19 2200 02/27/19 2201 02/27/19 2208  BP: (!) 156/116  (!) 167/112 (!) 172/113  Pulse: 70 (!) 58  65  Resp: (!) 25 12  (!) 26  SpO2: 93% 96%  97%  Weight:      Height:      PainSc:        Isolation Precautions No active isolations  Medications Medications  nitroGLYCERIN (NITROSTAT) SL tablet 0.4 mg (has no administration in time range)  heparin ADULT infusion 100 units/mL (25000 units/247mL sodium chloride 0.45%) (1,200 Units/hr Intravenous New Bag/Given 02/27/19 2145)  amLODipine (NORVASC) tablet 10 mg (has no administration in time range)  carvedilol (COREG) tablet 6.25 mg (has no administration in time range)  furosemide (LASIX) tablet 40 mg (40 mg Oral Given 02/27/19 2141)   isosorbide mononitrate (IMDUR) 24 hr tablet 30 mg (has no administration in time range)  lisinopril (ZESTRIL) tablet 5 mg (5 mg Oral Given 02/27/19 2141)  nitroGLYCERIN (NITRODUR - Dosed in mg/24 hr) patch 0.2 mg (0.2 mg Transdermal Patch Applied 02/27/19 2142)  rosuvastatin (CRESTOR) tablet 10 mg (has no administration in time range)  spironolactone (ALDACTONE) tablet 25 mg (has no administration in time range)  hydrOXYzine (ATARAX/VISTARIL) tablet 25 mg (has no administration in time range)  acetaminophen (TYLENOL) tablet 650 mg (has no administration in time range)  ondansetron (ZOFRAN) injection 4 mg (has no administration in time range)  0.9 %  sodium chloride infusion ( Intravenous New Bag/Given 02/27/19 2139)  alum & mag hydroxide-simeth (MAALOX/MYLANTA) 200-200-20 MG/5ML suspension 30 mL (has no administration in time range)    And  lidocaine (XYLOCAINE) 2 % viscous mouth solution 15 mL (has no administration in time range)  aspirin EC tablet 325 mg (has no administration in time range)  zolpidem (AMBIEN) tablet 5 mg (has no administration in time range)  ALPRAZolam (XANAX) tablet 0.25 mg (has no administration in time range)  metoprolol tartrate (LOPRESSOR) injection 2.5 mg (has no administration in time range)  pantoprazole (PROTONIX) EC tablet 40 mg (has no administration in time range)  insulin aspart (novoLOG) injection 0-9 Units (has no administration in time range)  aspirin chewable tablet 324 mg (324 mg Oral Given 02/27/19 1930)  heparin bolus via infusion 4,000 Units (4,000 Units Intravenous Bolus from Bag 02/27/19 2146)  furosemide (LASIX) injection 40 mg (40 mg Intravenous Given 02/27/19 2209)    Mobility walks Low fall risk   Focused Assessments Cardiac Assessment Handoff:  Cardiac Rhythm: Bundle branch block(sinus rhythm at times) Lab Results  Component  Value Date   CKTOTAL 60 06/25/2011   CKMB 2.0 06/25/2011   TROPONINI 0.08 (H) 06/25/2011   No results found  for: DDIMER Does the Patient currently have chest pain? No     R Recommendations: See Admitting Provider Note  Report given to:   Additional Notes: denies chest pain since arrival in ED room

## 2019-02-27 NOTE — ED Notes (Signed)
Pt O 2 sat 92%, placed on O2 2 L [place].

## 2019-02-27 NOTE — ED Triage Notes (Signed)
Pt arrived via POV with reports of chest pain and shortness of breath. Pt reports CP started years ago, but has been intermittent more frequently. Pt has hx of CABG x 4.  Pt states shortness of breath started today.  Pt denies any pain at this time. Denies any COVID contacts.  Reports non-productive cough.   Pt c/o orthopnea.

## 2019-02-28 ENCOUNTER — Encounter
Admission: EM | Disposition: A | Discharge status: Home / Self Care | Payer: Self-pay | Source: Home / Self Care | Attending: Internal Medicine

## 2019-02-28 ENCOUNTER — Inpatient Hospital Stay
Admit: 2019-02-28 | Discharge: 2019-02-28 | Disposition: A | Discharge status: Home / Self Care | Payer: Medicare Other | Attending: Family Medicine | Admitting: Family Medicine

## 2019-02-28 ENCOUNTER — Inpatient Hospital Stay: Payer: Medicare Other

## 2019-02-28 DIAGNOSIS — I5023 Acute on chronic systolic (congestive) heart failure: Secondary | ICD-10-CM | POA: Diagnosis present

## 2019-02-28 DIAGNOSIS — D696 Thrombocytopenia, unspecified: Secondary | ICD-10-CM | POA: Diagnosis present

## 2019-02-28 DIAGNOSIS — E1122 Type 2 diabetes mellitus with diabetic chronic kidney disease: Secondary | ICD-10-CM | POA: Diagnosis present

## 2019-02-28 DIAGNOSIS — N1831 Chronic kidney disease, stage 3a: Secondary | ICD-10-CM

## 2019-02-28 DIAGNOSIS — E1129 Type 2 diabetes mellitus with other diabetic kidney complication: Secondary | ICD-10-CM | POA: Diagnosis present

## 2019-02-28 DIAGNOSIS — K769 Liver disease, unspecified: Secondary | ICD-10-CM | POA: Diagnosis present

## 2019-02-28 DIAGNOSIS — I10 Essential (primary) hypertension: Secondary | ICD-10-CM | POA: Diagnosis present

## 2019-02-28 DIAGNOSIS — E785 Hyperlipidemia, unspecified: Secondary | ICD-10-CM

## 2019-02-28 DIAGNOSIS — R319 Hematuria, unspecified: Secondary | ICD-10-CM | POA: Diagnosis present

## 2019-02-28 DIAGNOSIS — R079 Chest pain, unspecified: Secondary | ICD-10-CM

## 2019-02-28 DIAGNOSIS — I7103 Dissection of thoracoabdominal aorta: Secondary | ICD-10-CM | POA: Diagnosis present

## 2019-02-28 HISTORY — PX: LEFT HEART CATH AND CORS/GRAFTS ANGIOGRAPHY: CATH118250

## 2019-02-28 LAB — URINALYSIS, COMPLETE (UACMP) WITH MICROSCOPIC
Bacteria, UA: NONE SEEN
Bilirubin Urine: NEGATIVE
Glucose, UA: NEGATIVE mg/dL
Ketones, ur: NEGATIVE mg/dL
Leukocytes,Ua: NEGATIVE
Nitrite: NEGATIVE
Protein, ur: NEGATIVE mg/dL
Specific Gravity, Urine: 1.011 (ref 1.005–1.030)
Squamous Epithelial / HPF: NONE SEEN (ref 0–5)
WBC, UA: NONE SEEN WBC/hpf (ref 0–5)
pH: 5 (ref 5.0–8.0)

## 2019-02-28 LAB — GLUCOSE, CAPILLARY
Glucose-Capillary: 135 mg/dL — ABNORMAL HIGH (ref 70–99)
Glucose-Capillary: 142 mg/dL — ABNORMAL HIGH (ref 70–99)
Glucose-Capillary: 251 mg/dL — ABNORMAL HIGH (ref 70–99)
Glucose-Capillary: 89 mg/dL (ref 70–99)
Glucose-Capillary: 91 mg/dL (ref 70–99)

## 2019-02-28 LAB — TROPONIN I (HIGH SENSITIVITY)
Troponin I (High Sensitivity): 5698 ng/L
Troponin I (High Sensitivity): 5647 ng/L (ref <18)
Troponin I (High Sensitivity): 6441 ng/L (ref <18)
Troponin I (High Sensitivity): 7179 ng/L (ref <18)

## 2019-02-28 LAB — HEMOGLOBIN A1C
Hgb A1c MFr Bld: 6.5 % — ABNORMAL HIGH (ref 4.8–5.6)
Mean Plasma Glucose: 139.85 mg/dL

## 2019-02-28 LAB — HEPARIN LEVEL (UNFRACTIONATED): Heparin Unfractionated: 0.31 IU/mL (ref 0.30–0.70)

## 2019-02-28 LAB — SARS CORONAVIRUS 2 (TAT 6-24 HRS): SARS Coronavirus 2: NEGATIVE

## 2019-02-28 SURGERY — LEFT HEART CATH AND CORS/GRAFTS ANGIOGRAPHY
Anesthesia: Moderate Sedation

## 2019-02-28 MED ORDER — SODIUM CHLORIDE 0.9 % IV SOLN: 250.0000 mL | INTRAVENOUS | Status: DC | PRN

## 2019-02-28 MED ORDER — ASPIRIN 81 MG PO CHEW: 81.0000 mg | CHEWABLE_TABLET | ORAL | Status: DC

## 2019-02-28 MED ORDER — SODIUM CHLORIDE 0.9 % WEIGHT BASED INFUSION
1.0000 mL/kg/h | INTRAVENOUS | Status: AC
  Administered 2019-02-28: 18:00:00 1 mL/kg/h via INTRAVENOUS

## 2019-02-28 MED ORDER — HEPARIN (PORCINE) IN NACL 2000-0.9 UNIT/L-% IV SOLN
INTRAVENOUS | Status: DC | PRN
  Administered 2019-02-28: 500 mL

## 2019-02-28 MED ORDER — MIDAZOLAM HCL 2 MG/2ML IJ SOLN
INTRAMUSCULAR | Status: AC
  Filled 2019-02-28: qty 2

## 2019-02-28 MED ORDER — HEPARIN SODIUM (PORCINE) 5000 UNIT/ML IJ SOLN
5000.0000 [IU] | Freq: Three times a day (TID) | INTRAMUSCULAR | Status: DC
  Administered 2019-02-28 – 2019-03-01 (×2): 5000 [IU] via SUBCUTANEOUS
  Filled 2019-02-28 (×2): qty 1

## 2019-02-28 MED ORDER — CHLORHEXIDINE GLUCONATE CLOTH 2 % EX PADS
6.0000 | MEDICATED_PAD | Freq: Every day | CUTANEOUS | Status: DC
  Administered 2019-02-28: 6 via TOPICAL

## 2019-02-28 MED ORDER — SODIUM CHLORIDE 0.9% FLUSH
3.0000 mL | Freq: Two times a day (BID) | INTRAVENOUS | Status: DC
  Administered 2019-02-28 (×2): 3 mL via INTRAVENOUS

## 2019-02-28 MED ORDER — SODIUM CHLORIDE 0.9% FLUSH: 3.0000 mL | INTRAVENOUS | Status: DC | PRN

## 2019-02-28 MED ORDER — RANOLAZINE ER 500 MG PO TB12
500.0000 mg | ORAL_TABLET | Freq: Two times a day (BID) | ORAL | Status: DC
  Administered 2019-02-28 – 2019-03-01 (×2): 500 mg via ORAL
  Filled 2019-02-28 (×3): qty 1

## 2019-02-28 MED ORDER — ONDANSETRON HCL 4 MG/2ML IJ SOLN: 4.0000 mg | Freq: Four times a day (QID) | INTRAMUSCULAR | Status: DC | PRN

## 2019-02-28 MED ORDER — ACETAMINOPHEN 325 MG PO TABS: 650.0000 mg | ORAL_TABLET | ORAL | Status: DC | PRN

## 2019-02-28 MED ORDER — MORPHINE SULFATE (PF) 2 MG/ML IV SOLN
2.0000 mg | INTRAVENOUS | Status: DC | PRN
  Administered 2019-02-28: 12:00:00 2 mg via INTRAVENOUS
  Filled 2019-02-28: qty 1

## 2019-02-28 MED ORDER — SODIUM CHLORIDE 0.9 % WEIGHT BASED INFUSION: 3.0000 mL/kg/h | INTRAVENOUS | Status: DC

## 2019-02-28 MED ORDER — SODIUM CHLORIDE 0.9 % WEIGHT BASED INFUSION: 1.0000 mL/kg/h | INTRAVENOUS | Status: DC

## 2019-02-28 MED ORDER — HYDRALAZINE HCL 20 MG/ML IJ SOLN: 10.0000 mg | INTRAMUSCULAR | Status: AC | PRN

## 2019-02-28 MED ORDER — FENTANYL CITRATE (PF) 100 MCG/2ML IJ SOLN
INTRAMUSCULAR | Status: AC
  Filled 2019-02-28: qty 2

## 2019-02-28 MED ORDER — SODIUM CHLORIDE 0.9% FLUSH
3.0000 mL | Freq: Two times a day (BID) | INTRAVENOUS | Status: DC
  Administered 2019-03-01: 09:00:00 3 mL via INTRAVENOUS

## 2019-02-28 MED ORDER — IOHEXOL 350 MG/ML SOLN
75.0000 mL | Freq: Once | INTRAVENOUS | Status: AC | PRN
  Administered 2019-02-28: 02:00:00 75 mL via INTRAVENOUS

## 2019-02-28 MED ORDER — HEPARIN (PORCINE) IN NACL 1000-0.9 UT/500ML-% IV SOLN
INTRAVENOUS | Status: AC
  Filled 2019-02-28: qty 1000

## 2019-02-28 MED ORDER — IOHEXOL 300 MG/ML  SOLN
INTRAMUSCULAR | Status: DC | PRN
  Administered 2019-02-28: 160 mL

## 2019-02-28 MED ORDER — LABETALOL HCL 5 MG/ML IV SOLN: 10.0000 mg | INTRAVENOUS | Status: AC | PRN

## 2019-02-28 MED ORDER — OXYCODONE-ACETAMINOPHEN 5-325 MG PO TABS
1.0000 | ORAL_TABLET | Freq: Four times a day (QID) | ORAL | Status: DC | PRN
  Administered 2019-02-28: 1 via ORAL
  Filled 2019-02-28: qty 1

## 2019-02-28 MED ORDER — METOPROLOL TARTRATE 5 MG/5ML IV SOLN: 2.5000 mg | Freq: Four times a day (QID) | INTRAVENOUS | Status: DC | PRN

## 2019-02-28 MED ORDER — SODIUM CHLORIDE 0.9 % IV SOLN
INTRAVENOUS | Status: AC | PRN
  Administered 2019-02-28: 250 mL via INTRAVENOUS

## 2019-02-28 MED ORDER — ISOSORBIDE MONONITRATE ER 60 MG PO TB24
60.0000 mg | ORAL_TABLET | Freq: Every day | ORAL | Status: DC
  Administered 2019-03-01: 60 mg via ORAL
  Filled 2019-02-28: qty 1

## 2019-02-28 SURGICAL SUPPLY — 11 items
CATH INFINITI 5FR ANG PIGTAIL (CATHETERS) ×2 IMPLANT
CATH INFINITI 5FR JL4 (CATHETERS) ×2 IMPLANT
CATH INFINITI 5FR JL5 (CATHETERS) ×2 IMPLANT
CATH INFINITI JR4 5F (CATHETERS) ×2 IMPLANT
CATH VISTA GUIDE 6FR JR4 (CATHETERS) ×2 IMPLANT
KIT MANI 3VAL PERCEP (MISCELLANEOUS) ×2 IMPLANT
NEEDLE PERC 18GX7CM (NEEDLE) ×2 IMPLANT
PACK CARDIAC CATH (CUSTOM PROCEDURE TRAY) ×2 IMPLANT
SHEATH AVANTI 5FR X 11CM (SHEATH) ×4 IMPLANT
SHEATH BRITE TIP 6FR X 23 (SHEATH) ×2 IMPLANT
WIRE GUIDERIGHT .035X150 (WIRE) ×4 IMPLANT

## 2019-02-28 NOTE — Progress Notes (Signed)
This RN attempted to place urinary catheter this morning, without success, unable to advance catheter into bladder due to possible obstruction. Prior to insertion, patient c/o penile pain with bloody discharge. MD at bedside, placed new order for urologist consult.

## 2019-02-28 NOTE — Progress Notes (Signed)
ANTICOAGULATION CONSULT NOTE - Initial Consult  Pharmacy Consult for Heparin  Indication: chest pain/ACS  No Known Allergies  Patient Measurements: Height: 5\' 9"  (175.3 cm) Weight: 185 lb 4.8 oz (84.1 kg) IBW/kg (Calculated) : 70.7 Heparin Dosing Weight: 86.2 kg   Vital Signs: Temp: 98 F (36.7 C) (11/09 0347) Temp Source: Oral (11/09 0347) BP: 127/85 (11/09 0600) Pulse Rate: 67 (11/09 0600)  Labs: Recent Labs    02/27/19 1827 02/27/19 1942 02/27/19 2021 02/28/19 0612  HGB 14.3  --   --   --   HCT 43.1  --   --   --   PLT 106*  --   --   --   APTT  --  43*  --   --   LABPROT  --  15.4*  --   --   INR  --  1.2  --   --   HEPARINUNFRC  --   --   --  0.31  CREATININE 1.26*  --   --   --   TROPONINIHS 6,547*  --  6,163* 5,698*    Estimated Creatinine Clearance: 44.4 mL/min (A) (by C-G formula based on SCr of 1.26 mg/dL (H)).   Medical History: Past Medical History:  Diagnosis Date  . CAD (coronary artery disease)   . Diabetes mellitus ([place])   . Gross hematuria   . Hyperlipidemia   . Hypertension   . Inguinal hernia   . Nodular prostate   . Syncope     Medications:  Medications Prior to Admission  Medication Sig Dispense Refill Last Dose  . amLODipine (NORVASC) 10 MG tablet Take 1 tablet (10 mg total) by mouth daily at 6 (six) AM. 90 tablet 1 Past Week at Unknown time  . aspirin 81 MG tablet Take 81 mg by mouth daily.   Past Week at Unknown time  . carvedilol (COREG) 6.25 MG tablet Take 1 tablet (6.25 mg total) by mouth 2 (two) times daily with a meal. 180 tablet 1 Past Week at Unknown time  . furosemide (LASIX) 40 MG tablet Take 1 tablet (40 mg total) by mouth daily. 90 tablet 1 Past Week at Unknown time  . glipiZIDE (GLUCOTROL) 5 MG tablet TAKE 1 TABLET BY MOUTH TWICE DAILY BEFORE A MEAL 180 tablet 1 02/27/2019 at Unknown time  . isosorbide mononitrate (IMDUR) 30 MG 24 hr tablet Take 1 tablet (30 mg total) by mouth daily at 6 (six) AM. 90 tablet 1 Past Week  at Unknown time  . lisinopril (ZESTRIL) 5 MG tablet Take 1 tablet (5 mg total) by mouth daily. 90 tablet 1 Past Week at Unknown time  . metFORMIN (GLUCOPHAGE) 500 MG tablet Take 1 tablet (500 mg total) by mouth 2 (two) times daily with a meal. 180 tablet 1 02/27/2019 at Unknown time  . rosuvastatin (CRESTOR) 10 MG tablet Take 1 tablet (10 mg total) by mouth daily. 90 tablet 1 Past Week at Unknown time  . spironolactone (ALDACTONE) 25 MG tablet Take 1 tablet (25 mg total) by mouth daily. 30 tablet 0 Past Week at Unknown time  . hydrOXYzine (ATARAX/VISTARIL) 10 MG tablet Take 1 tablet (10 mg total) by mouth 3 (three) times daily as needed. 30 tablet 1 prn at prn  . nitroGLYCERIN (NITRODUR - DOSED IN MG/24 HR) 0.2 mg/hr patch UNWRAP AND APPLY 1 PATCH ONCE DAILY, LEAVE ON FOR 12-14 HOURS(WAIT 10-12 HOURS BEFORE NEXT PATCH) 90 patch 0 prn at prn    Assessment: Pharmacy consulted to dose  heparin in this 85 year old male admitted with ACS/NSTEMI.  No prior anticoag noted.  CrCl = 48.3 ml/min  Goal of Therapy:  Heparin level 0.3-0.7 units/ml Monitor platelets by anticoagulation protocol: Yes   Plan:  11/09 @ 0600 HL 0.31 therapeutic. Will continue current rate and will recheck HL 1400, baseline CBC WNL will continue to monitor.  Thomasene Ripple, PharmD, BCPS Clinical Pharmacist 02/28/2019,7:12 AM

## 2019-02-28 NOTE — Progress Notes (Signed)
Subjective:  Doing reasonably well post cath denies any angina Foley in place with mild hematuria placed secondary to dysuria and possible urinary obstruction  Objective:  Vital Signs in the last 24 hours: Temp:  [97.5 F (36.4 C)-98 F (36.7 C)] 97.7 F (36.5 C) (11/09 1336) Pulse Rate:  [58-108] 59 (11/09 1336) Resp:  [12-33] 13 (11/09 1336) BP: (102-180)/(67-139) 102/67 (11/09 1336) SpO2:  [90 %-100 %] 95 % (11/09 1336) Weight:  [84.1 kg-86.2 kg] 84.1 kg (11/09 0347)  Intake/Output from previous day: 11/08 0701 - 11/09 0700 In: 419.6 [I.V.:419.6] Out: 1500 [Urine:1500] Intake/Output from this shift: Total I/O In: -  Out: 700 [Urine:700]  Physical Exam: General appearance: appears stated age Neck: no adenopathy, no carotid bruit, no JVD, supple, symmetrical, trachea midline and thyroid not enlarged, symmetric, no tenderness/mass/nodules Lungs: clear to auscultation bilaterally Heart: regular rate and rhythm, S1, S2 normal, no murmur, click, rub or gallop Abdomen: soft, non-tender; bowel sounds normal; no masses,  no organomegaly Extremities: extremities normal, atraumatic, no cyanosis or edema Pulses: 2+ and symmetric Skin: Skin color, texture, turgor normal. No rashes or lesions Neurologic: Alert and oriented X 3, normal strength and tone. Normal symmetric reflexes. Normal coordination and gait  Lab Results: Recent Labs    02/27/19 1827  WBC 6.6  HGB 14.3  PLT 106*   Recent Labs    02/27/19 1827  NA 141  K 3.9  CL 110  CO2 18*  GLUCOSE 127*  BUN 16  CREATININE 1.26*   No results for input(s): TROPONINI in the last 72 hours.  Invalid input(s): CK, MB Hepatic Function Panel No results for input(s): PROT, ALBUMIN, AST, ALT, ALKPHOS, BILITOT, BILIDIR, IBILI in the last 72 hours. No results for input(s): CHOL in the last 72 hours. No results for input(s): PROTIME in the last 72 hours.  Imaging: Imaging results have been reviewed  Cardiac  Studies:  Assessment/Plan:  Non-STEMI Elevated troponin Angina CABG Cardiomyopathy Chest Pain Coronary Artery Disease Ischemic Heart Disease Shortness of Breath  . Plan Status post cardiac cath with multivessel disease high-grade lesion in graft to OM Subclavian appears to be occluded difficult to access Known occlusion of right graft and right coronary artery Mild to moderately depressed left ventricular function with inferior akinesis EF of around 35 to 40% Urinary obstruction continue Foley follow-up urology's recommendation We will refer films to do and consider stage further possible intervention upon their recommendations  LOS: 1 day    Kalene Cutler D Terisha Losasso 02/28/2019, 4:03 PM

## 2019-02-28 NOTE — Progress Notes (Signed)
*  PRELIMINARY RESULTS* Echocardiogram 2D Echocardiogram has been performed.  Kenneth Mitchell 02/28/2019, 10:54 AM

## 2019-02-28 NOTE — Progress Notes (Signed)
Complaining of pain "in my pecker".  Reports unable to void at this time.  No abnormalities noted to penis at this time.  Last void at 0341 with clear yellow urine. Bladder scan=600 ml.  On call NP made aware of complaints.

## 2019-02-28 NOTE — Consult Note (Signed)
Urology Consult  I have been asked to see the patient by Dr. Clyde Lundborg, for evaluation and management of acute urinary retention and difficult Foley placement.  Chief Complaint: penile pain, bleeding per urethra  History of Present Illness: Kenneth Mitchell is a 85 y.o. year old male admitted yesterday with NSTEMI with plans for cardiac catheterization this afternoon. He subsequently complained of pain at the tip of his penis with the inability to urinate. Bladder scan with . He was successfully I&O cathed this morning, however a subsequent attempt to place a 16Fr urinary catheter was unsuccessful. Patient reports blood dripping from his penis since this unsuccessful attempt.  Patient was previously seen in our clinic in 2016 for gross hematuria, also with a history of elevated PSA with palpable nodule. He was recommended to undergo prostate biopsy, cystoscopy, and CT urogram, but he refused biopsy and cystoscopy. He was lost to follow-up. Per chart review, it does not appear that he has obtained urologic care elsewhere in the interim.  Today he reports pain at the tip of his penis that has been progressively worsening over the past several months. He reports nocturia x6. He reports low-volume voids but denies prior urinary retention.   Admission UA reassuring for infection.  CBC unavailable. He is afebrile and has been hypertensive to the 180s/130s.  On chart review, CT AP with contrast on 10/05/2017 revealed heterogeneous prostatomegaly approximately 5.32x6.16x5.70cm with mass effect into the bladder neck. Also with right-sided renal atrophy.  Past Medical History:  Diagnosis Date   CAD (coronary artery disease)    Diabetes mellitus (HCC)    Gross hematuria    Hyperlipidemia    Hypertension    Inguinal hernia    Nodular prostate    Syncope     Past Surgical History:  Procedure Laterality Date   aneurysm surgery     x 2   quadrupal bypass      Home Medications:    Current Meds  Medication Sig   amLODipine (NORVASC) 10 MG tablet Take 1 tablet (10 mg total) by mouth daily at 6 (six) AM.   aspirin 81 MG tablet Take 81 mg by mouth daily.   carvedilol (COREG) 6.25 MG tablet Take 1 tablet (6.25 mg total) by mouth 2 (two) times daily with a meal.   furosemide (LASIX) 40 MG tablet Take 1 tablet (40 mg total) by mouth daily.   glipiZIDE (GLUCOTROL) 5 MG tablet TAKE 1 TABLET BY MOUTH TWICE DAILY BEFORE A MEAL   isosorbide mononitrate (IMDUR) 30 MG 24 hr tablet Take 1 tablet (30 mg total) by mouth daily at 6 (six) AM.   lisinopril (ZESTRIL) 5 MG tablet Take 1 tablet (5 mg total) by mouth daily.   metFORMIN (GLUCOPHAGE) 500 MG tablet Take 1 tablet (500 mg total) by mouth 2 (two) times daily with a meal.   rosuvastatin (CRESTOR) 10 MG tablet Take 1 tablet (10 mg total) by mouth daily.   spironolactone (ALDACTONE) 25 MG tablet Take 1 tablet (25 mg total) by mouth daily.    Allergies: No Known Allergies  Family History  Problem Relation Age of Onset   Diabetes Mother    Heart disease Father    Stroke Father    Cancer Brother    Kidney disease Neg Hx    Prostate cancer Neg Hx     Social History:  reports that he has quit smoking. He has never used smokeless tobacco. He reports that he does not drink alcohol or  use drugs.  ROS: A complete review of systems was performed.  All systems are negative except for pertinent findings as noted.  Physical Exam:  Vital signs in last 24 hours: Temp:  [97.5 F (36.4 C)-98 F (36.7 C)] 97.6 F (36.4 C) (11/09 0839) Pulse Rate:  [58-108] 87 (11/09 0839) Resp:  [12-33] 20 (11/09 0347) BP: (120-180)/(77-139) 134/88 (11/09 0839) SpO2:  [90 %-100 %] 97 % (11/09 0839) Weight:  [84.1 kg-86.2 kg] 84.1 kg (11/09 0347) Constitutional:  Alert and oriented, no acute distress HEENT: [place] AT, moist mucus membranes Cardiovascular: No clubbing, cyanosis, or edema Respiratory: Normal respiratory effort GU:  Uncircumcised phallus with bilateral descended testes. Frank blood dripping from the urethral meatus. Skin: No rashes, bruises or suspicious lesions Neurologic: Grossly intact, no focal deficits, moving all 4 extremities Psychiatric: Normal mood and affect, disinhibited speech  Laboratory Data:  Recent Labs    02/27/19 1827  WBC 6.6  HGB 14.3  HCT 43.1   Recent Labs    02/27/19 1827  NA 141  K 3.9  CL 110  CO2 18*  GLUCOSE 127*  BUN 16  CREATININE 1.26*  CALCIUM 9.5   Recent Labs    02/27/19 1942  INR 1.2   Urinalysis    Component Value Date/Time   COLORURINE COLORLESS (A) 02/28/2019 [place] (A) 02/28/2019 0511   APPEARANCEUR Clear 06/25/2011 0613   LABSPEC 1.011 02/28/2019 0511   LABSPEC >1.060 06/25/2011 [place] 5.0 02/28/2019 0511   GLUCOSEU NEGATIVE 02/28/2019 0511   GLUCOSEU Negative 06/25/2011 0613   HGBUR SMALL (A) 02/28/2019 0511   BILIRUBINUR NEGATIVE 02/28/2019 0511   BILIRUBINUR negative 03/19/2015 1041   BILIRUBINUR Negative 06/25/2011 [place] 02/28/2019 0511   PROTEINUR NEGATIVE 02/28/2019 0511   UROBILINOGEN 0.2 03/19/2015 1041   NITRITE NEGATIVE 02/28/2019 0511   LEUKOCYTESUR NEGATIVE 02/28/2019 0511   LEUKOCYTESUR Negative 06/25/2011 8546   Results for orders placed or performed during the hospital encounter of 02/27/19  SARS CORONAVIRUS 2 (TAT 6-24 HRS) Nasopharyngeal Nasopharyngeal Swab     Status: None   Collection Time: 02/27/19  9:36 PM   Specimen: Nasopharyngeal Swab  Result Value Ref Range Status   SARS Coronavirus 2 NEGATIVE NEGATIVE Final    Comment: (NOTE) SARS-CoV-2 target nucleic acids are NOT DETECTED. The SARS-CoV-2 RNA is generally detectable in upper and lower respiratory specimens during the acute phase of infection. Negative results do not preclude SARS-CoV-2 infection, do not rule out co-infections with other pathogens, and should not be used as the sole basis for treatment or  other patient management decisions. Negative results must be combined with clinical observations, patient history, and epidemiological information. The expected result is Negative. Fact Sheet for Patients: SugarRoll.be Fact Sheet for Healthcare Providers: https://www.woods-mathews.com/ This test is not yet approved or cleared by the [place] FDA and  has been authorized for detection and/or diagnosis of SARS-CoV-2 by FDA under an Emergency Use Authorization (EUA). This EUA will remain  in effect (meaning this test can be used) for the duration of the COVID-19 declaration under Section 56 4(b)(1) of the Act, 21 U.S.C. section 360bbb-3(b)(1), unless the authorization is terminated or revoked sooner. Performed at [facility], [place] [address], [place], [place] [address]     Radiologic Imaging: CT abdomen pelvis with contrast, 10/05/2017: CLINICAL DATA:  85 year old male with acute abdominal and pelvic pain.  EXAM: CT ABDOMEN AND PELVIS WITH CONTRAST  TECHNIQUE: Multidetector CT imaging of  the abdomen and pelvis was performed using the standard protocol following bolus administration of intravenous contrast.  CONTRAST:  100 cc intravenous Omnipaque 300  COMPARISON:  04/04/2015 prior CTs  FINDINGS: Lower chest: No acute abnormality. Cardiomegaly and cardiac surgical changes identified.  Hepatobiliary: Hepatic steatosis and a RIGHT hepatic cyst again identified. The gallbladder is unremarkable. No biliary dilatation.  Pancreas: Unremarkable  Spleen: Unremarkable  Adrenals/Urinary Tract: The RIGHT kidney is severely atrophic. There is no evidence of hydronephrosis or renal mass. The adrenal glands and bladder are unremarkable.  Stomach/Bowel: Stomach is within normal limits. Appendix appears normal. No evidence of bowel wall thickening, distention, or inflammatory changes. Colonic diverticulosis noted without  evidence of diverticulitis.  Vascular/Lymphatic: Abdominal aortic aneurysm and aorto bi-iliac stent graft again noted. Possible type 2 endoleak posteriorly (series 4: Image 44) noted. The native aneurysm sac measures up to 4.5 cm and unchanged from 2016.  There is enlargement of a pseudoaneurysm extending off of the anterior RIGHT aspect of the abdominal aorta just above the stent graft measuring 2.7 cm (series 4:32), previously measuring 2 cm on 04/04/2015. No adjacent hemorrhage is identified.  A 3 cm pseudoaneurysm extending off of the RIGHT internal iliac artery (series 4:69) previously measured 2.5 cm in 2016.  Atherosclerotic calcification within the renal and mesenteric arteries noted, which are grossly patent.  Reproductive: Marked prostate enlargement again noted.  Other: No ascites, focal collection, acute hemorrhage or pneumoperitoneum. Moderate to large bilateral inguinal hernias containing fat are present.  Musculoskeletal: No acute or suspicious bony abnormalities.  IMPRESSION: 1. Abdominal aortic aneurysm and aorto bi-iliac stent graft again noted with possible type 2 endoleak. No significant change in native aneurysm sac size (4.5 cm) since 2016. 2. Enlarging pseudoaneurysms since 2016 extending off of the anterior RIGHT abdominal aorta above the stent graft (now 2.7 cm, previously 2 cm) and extending off of the RIGHT internal iliac artery (now 3 cm, previously 2.5 cm). No evidence of adjacent hemorrhage/hematoma. 3. Hepatic steatosis. 4. Cardiomegaly 5. Marked prostate enlargement 6. Atrophic RIGHT kidney 7. Moderate to large bilateral inguinal hernias containing fat.   Electronically Signed   By: Harmon PierJeffrey  Hu M.D.   On: 10/05/2017 12:09  Assessment & Plan:  85 year old male admitted with NSTEMI with plans for cardiac catheterization this afternoon presents with new acute urinary retention with traumatic Foley placement this afternoon.   Patient previously known to our practice with history of gross hematuria, elevated PSA, and prostatic nodule; he refused cystoscopy and prostate biopsy for further evaluation of these.  Most recent abdominal imaging from 2019 with an approximate 98g prostate with mass-effect into the bladder neck.  I placed a Foley catheter today at the bedside with return of gross hematuria.  Urine was noted to be runny and without clots.  I overfilled the catheter balloon to 30 cc and left the catheter on tension to tamponade what I suspect to be prostatic bleeding.  Complete procedure notes below.  I am concerned for possible prostate cancer given patient's history of elevated PSA and prostatic nodule without biopsy as well as recent worsening in obstructive voiding symptoms. Will defer further evaluation as outpatient. See recommendations below.  Simple Catheter Placement Due to urinary retention patient is present today for a foley cath placement.  Patient's foreskin was retracted.  Patient was cleaned and prepped in a sterile fashion with betadine and lidocaine jelly 2% was instilled into the urethra.  A 18 FR coude foley catheter was inserted, urine return was noted at 725ml,  urine was cherry red, thin, and without clots.  The balloon was filled with 30cc of sterile water, the foreskin was reduced, and the catheter was fixed to the inner left thigh and left on tension.  An overnight bag was attached for drainage. Patient tolerated well, no complications were noted. Performed by: Carman Ching, PA-C   Recommendations: -Initiate tamsulosin 0.4mg  daily -Retain Foley; outpatient trial of void in 10-14 days  Thank you for involving me in this patient's care, please page with any further questions or concerns.  Carman Ching, PA-C 02/28/2019 1:23 PM

## 2019-02-28 NOTE — Consult Note (Signed)
[facility] Vascular Consult Note  MRN : [id number]  Kenneth Mitchell is a 85 y.o. (10-Jan-1934) male who presents with chief complaint of  Chief Complaint  Patient presents with  . Shortness of Breath  . Chest Pain   History of Present Illness:  The patient is an 85 year old male with multiple medical issues including a past medical history of abdominal aortic aneurysm and thoracic aortic aneurysm who presented to the [facility] emergency department with a chief complaint of "chest pain".  Patient with known history of coronary artery disease s/p CABG in the past who presented with worsening chest pain and shortness of breath.  He describes the pressure to be acute in onset located in the mid sternal area worsened with activity. He denied any fever, nausea vomiting. Heparin drip was started.  08/2006: AAA Repair with Dr. Earnestine Leys  10/05/17 CT A/P: 1. Abdominal aortic aneurysm and aorto bi-iliac stent graft again noted with possible type 2 endoleak. No significant change in native aneurysm sac size (4.5 cm) since 2016. 2. Enlarging pseudoaneurysms since 2016 extending off of the anterior RIGHT abdominal aorta above the stent graft (now 2.7 cm, previously 2 cm) and extending off of the RIGHT internal iliac artery (now 3 cm, previously 2.5 cm). No evidence of adjacent hemorrhage/hematoma.  02/28/19 CTA Chest: 1. No evidence of pulmonary emboli. 2. Changes consistent with the known dissection involving the descending thoracic and upper abdominal aorta. Dilatation at the level of the aortic hiatus is noted slightly increased when compared with the prior exam. The degree of persistent opacification of the dissection is unable to be performed on this exam. Nonemergent CTA of the chest for aortic evaluation is recommended when able.  Vascular surgery was consulted by Dr. Clyde Lundborg for thoracic aneurysm.   Current Facility-Administered Medications  Medication  Dose Route Frequency Provider Last Rate Last Dose  . 0.9 %  sodium chloride infusion  250 mL Intravenous PRN Callwood, Dwayne D, MD      . Melene Muller ON 03/01/2019] 0.9% sodium chloride infusion  3 mL/kg/hr Intravenous Continuous Callwood, Dwayne D, MD       Followed by  . [START ON 03/01/2019] 0.9% sodium chloride infusion  1 mL/kg/hr Intravenous Continuous Callwood, Dwayne D, MD      . acetaminophen (TYLENOL) tablet 650 mg  650 mg Oral Q4H PRN Mansy, Jan A, MD   650 mg at 02/28/19 1027  . ALPRAZolam Prudy Feeler) tablet 0.25 mg  0.25 mg Oral BID PRN Mansy, Jan A, MD   0.25 mg at 02/28/19 1028  . alum & mag hydroxide-simeth (MAALOX/MYLANTA) 200-200-20 MG/5ML suspension 30 mL  30 mL Oral Once Mansy, Jan A, MD       And  . lidocaine (XYLOCAINE) 2 % viscous mouth solution 15 mL  15 mL Oral Once Mansy, Jan A, MD      . amLODipine (NORVASC) tablet 10 mg  10 mg Oral Q0600 Mansy, Jan A, MD   10 mg at 02/28/19 0446  . [START ON 03/01/2019] aspirin chewable tablet 81 mg  81 mg Oral Pre-Cath Callwood, Dwayne D, MD      . aspirin EC tablet 325 mg  325 mg Oral Daily Mansy, Jan A, MD   325 mg at 02/28/19 1027  . carvedilol (COREG) tablet 6.25 mg  6.25 mg Oral BID WC Mansy, Jan A, MD   6.25 mg at 02/28/19 1028  . furosemide (LASIX) injection 40 mg  40 mg Intravenous Q12H Mansy, Jan  A, MD   40 mg at 02/28/19 1027  . heparin ADULT infusion 100 units/mL (25000 units/221mL sodium chloride 0.45%)  1,200 Units/hr Intravenous Continuous Mansy, Jan A, MD   Stopped at 02/28/19 1341  . hydrOXYzine (ATARAX/VISTARIL) tablet 25 mg  25 mg Oral TID PRN Mansy, Jan A, MD      . insulin aspart (novoLOG) injection 0-9 Units  0-9 Units Subcutaneous TID WC Mansy, Jan A, MD      . isosorbide mononitrate (IMDUR) 24 hr tablet 30 mg  30 mg Oral Q0600 Mansy, Jan A, MD   30 mg at 02/28/19 0446  . lisinopril (ZESTRIL) tablet 5 mg  5 mg Oral Daily Mansy, Jan A, MD   5 mg at 02/28/19 1028  . metoprolol tartrate (LOPRESSOR) injection 2.5 mg  2.5 mg  Intravenous Q6H PRN Mansy, Jan A, MD      . morphine 2 MG/ML injection 2 mg  2 mg Intravenous Q4H PRN Lorretta Harp, MD      . nitroGLYCERIN (NITRODUR - Dosed in mg/24 hr) patch 0.2 mg  0.2 mg Transdermal Daily Mansy, Jan A, MD   0.2 mg at 02/28/19 1030  . nitroGLYCERIN (NITROSTAT) SL tablet 0.4 mg  0.4 mg Sublingual Q5 min PRN Phineas Semen, MD   0.4 mg at 02/28/19 0350  . ondansetron (ZOFRAN) injection 4 mg  4 mg Intravenous Q6H PRN Mansy, Jan A, MD      . oxyCODONE-acetaminophen (PERCOCET/ROXICET) 5-325 MG per tablet 1 tablet  1 tablet Oral Q6H PRN Lorretta Harp, MD   1 tablet at 02/28/19 1102  . pantoprazole (PROTONIX) EC tablet 40 mg  40 mg Oral Daily Mansy, Jan A, MD   40 mg at 02/28/19 1028  . rosuvastatin (CRESTOR) tablet 10 mg  10 mg Oral Daily Mansy, Jan A, MD   10 mg at 02/28/19 1029  . sodium chloride flush (NS) 0.9 % injection 3 mL  3 mL Intravenous Q12H Callwood, Dwayne D, MD   3 mL at 02/28/19 1029  . sodium chloride flush (NS) 0.9 % injection 3 mL  3 mL Intravenous PRN Callwood, Dwayne D, MD      . spironolactone (ALDACTONE) tablet 25 mg  25 mg Oral Daily Mansy, Jan A, MD   25 mg at 02/28/19 1028  . zolpidem (AMBIEN) tablet 5 mg  5 mg Oral QHS PRN Mansy, Vernetta Honey, MD       Past Medical History:  Diagnosis Date  . CAD (coronary artery disease)   . Diabetes mellitus (HCC)   . Gross hematuria   . Hyperlipidemia   . Hypertension   . Inguinal hernia   . Nodular prostate   . Syncope    Past Surgical History:  Procedure Laterality Date  . aneurysm surgery     x 2  . quadrupal bypass     Social History Social History   Tobacco Use  . Smoking status: Former Games developer  . Smokeless tobacco: Never Used  . Tobacco comment: quit 20 years  Substance Use Topics  . Alcohol use: No    Alcohol/week: 0.0 standard drinks  . Drug use: No   Family History Family History  Problem Relation Age of Onset  . Diabetes Mother   . Heart disease Father   . Stroke Father   . Cancer Brother   .  Kidney disease Neg Hx   . Prostate cancer Neg Hx   Denies family history of peripheral artery disease, aneurysmal disease or bleeding/clotting disorders.  No  Known Allergies  REVIEW OF SYSTEMS (Negative unless checked)  Constitutional: [] Weight loss  [] Fever  [] Chills Cardiac: [x] Chest pain   [x] Chest pressure   [x] Palpitations   [x] Shortness of breath when laying flat   [] Shortness of breath at rest   [x] Shortness of breath with exertion. Vascular:  [] Pain in legs with walking   [] Pain in legs at rest   [] Pain in legs when laying flat   [] Claudication   [] Pain in feet when walking  [] Pain in feet at rest  [] Pain in feet when laying flat   [] History of DVT   [] Phlebitis   [] Swelling in legs   [] Varicose veins   [] Non-healing ulcers Pulmonary:   [] Uses home oxygen   [] Productive cough   [] Hemoptysis   [] Wheeze  [] COPD   [] Asthma Neurologic:  [] Dizziness  [] Blackouts   [] Seizures   [] History of stroke   [] History of TIA  [] Aphasia   [] Temporary blindness   [] Dysphagia   [] Weakness or numbness in arms   [] Weakness or numbness in legs Musculoskeletal:  [] Arthritis   [] Joint swelling   [] Joint pain   [] Low back pain Hematologic:  [] Easy bruising  [] Easy bleeding   [] Hypercoagulable state   [] Anemic  [] Hepatitis Gastrointestinal:  [] Blood in stool   [] Vomiting blood  [] Gastroesophageal reflux/heartburn   [] Difficulty swallowing. Genitourinary:  [] Chronic kidney disease   [] Difficult urination  [] Frequent urination  [] Burning with urination   [] Blood in urine Skin:  [] Rashes   [] Ulcers   [] Wounds Psychological:  [] History of anxiety   []  History of major depression.  Physical Examination  Vitals:   02/28/19 0532 02/28/19 0600 02/28/19 0839 02/28/19 1336  BP: 120/77 127/85 134/88 102/67  Pulse: 62 67 87 (!) 59  Resp:    13  Temp:   97.6 F (36.4 C) 97.7 F (36.5 C)  TempSrc:   Oral Oral  SpO2: 96%  97% 95%  Weight:      Height:       Body mass index is 27.36 kg/m. Gen:  WD/WN, NAD Head:  /AT, No temporalis wasting. Prominent temp pulse not noted. Ear/Nose/Throat: Hearing grossly intact, nares w/o erythema or drainage, oropharynx w/o Erythema/Exudate Eyes: Sclera non-icteric, conjunctiva clear Neck: Trachea midline.  No JVD.  Pulmonary:  Good air movement, respirations not labored, equal bilaterally.  Cardiac: RRR, normal S1, S2. Vascular:  Vessel Right Left  Radial Palpable Palpable  Ulnar Palpable Palpable  Brachial Palpable Palpable  Carotid Palpable, without bruit Palpable, without bruit  Aorta Not palpable N/A  Femoral Palpable Palpable  Popliteal Palpable Palpable  PT Palpable Palpable  DP Palpable Palpable   Gastrointestinal: soft, non-tender/non-distended. No guarding/reflex.  Musculoskeletal: M/S 5/5 throughout.  Extremities without ischemic changes.  No deformity or atrophy. No edema. Neurologic: Sensation grossly intact in extremities.  Symmetrical.  Speech is fluent. Motor exam as listed above. Psychiatric: Judgment intact, Mood & affect appropriate for pt's clinical situation. Dermatologic: No rashes or ulcers noted.  No cellulitis or open wounds. Lymph : No Cervical, Axillary, or Inguinal lymphadenopathy.  CBC Lab Results  Component Value Date   WBC 6.6 02/27/2019   HGB 14.3 02/27/2019   HCT 43.1 02/27/2019   MCV 90.2 02/27/2019   PLT 106 (L) 02/27/2019   BMET    Component Value Date/Time   NA 141 02/27/2019 1827   NA 138 11/29/2014 0857   NA 138 06/26/2011 1001   K 3.9 02/27/2019 1827   K 3.9 06/26/2011 1001   CL 110 02/27/2019 1827   CL  104 06/26/2011 1001   CO2 18 (L) 02/27/2019 1827   CO2 23 06/26/2011 1001   GLUCOSE 127 (H) 02/27/2019 1827   GLUCOSE 211 (H) 06/26/2011 1001   BUN 16 02/27/2019 1827   BUN 23 11/29/2014 0857   BUN 19 (H) 06/26/2011 1001   CREATININE 1.26 (H) 02/27/2019 1827   CREATININE 1.37 (H) 06/26/2011 1001   CALCIUM 9.5 02/27/2019 1827   CALCIUM 8.8 06/26/2011 1001   GFRNONAA 52 (L) 02/27/2019 1827    GFRNONAA 54 (L) 06/26/2011 1001   GFRAA >60 02/27/2019 1827   GFRAA >60 06/26/2011 1001   Estimated Creatinine Clearance: 44.4 mL/min (A) (by C-G formula based on SCr of 1.26 mg/dL (H)).  COAG Lab Results  Component Value Date   INR 1.2 02/27/2019   Radiology Dg Chest 2 View  Result Date: 02/27/2019 CLINICAL DATA:  Chest pain and shortness of breath EXAM: CHEST - 2 VIEW COMPARISON:  03/11/2012 FINDINGS: Mild cardiomegaly. Status post CABG. The lungs are mildly hyperexpanded with diffusely increased interstitial markings. No pleural effusion or pneumothorax. No focal airspace consolidation or pulmonary edema. IMPRESSION: No active cardiopulmonary disease. Mild cardiomegaly and findings suggestive of COPD. Electronically Signed   By: Deatra Robinson M.D.   On: 02/27/2019 19:06   Ct Angio Chest Pe W Or Wo Contrast  Result Date: 02/28/2019 CLINICAL DATA:  Shortness of breath EXAM: CT ANGIOGRAPHY CHEST WITH CONTRAST TECHNIQUE: Multidetector CT imaging of the chest was performed using the standard protocol during bolus administration of intravenous contrast. Multiplanar CT image reconstructions and MIPs were obtained to evaluate the vascular anatomy. CONTRAST:  75mL OMNIPAQUE IOHEXOL 350 MG/ML SOLN COMPARISON:  Chest x-ray from previous day. FINDINGS: Cardiovascular: Thoracic aorta demonstrates atherosclerotic calcifications. No significant opacification of the aorta is seen. Enlargement of the descending aorta is again seen similar to that noted in 2013 related to previous dissection. Maximum diameter is 5.4 cm. No cardiac enlargement is noted. Pulmonary artery is well opacified within normal branching pattern. No definitive filling defects are identified to suggest pulmonary emboli. Changes of prior coronary bypass grafting are noted. Mediastinum/Nodes: Visualized esophagus is within normal limits. No hilar or mediastinal adenopathy is noted. The thoracic inlet is within normal limits. Lungs/Pleura:  Small right-sided pleural effusion is noted. Chronic scarring is noted within both lungs. Upper Abdomen: Visualized upper abdomen demonstrates hypodensity within the right lobe of the liver stable from the prior exam. No other focal upper abdominal abnormality is seen. Musculoskeletal: Degenerative changes of the thoracic spine are noted. Review of the MIP images confirms the above findings. IMPRESSION: No evidence of pulmonary emboli. Changes consistent with the known dissection involving the descending thoracic and upper abdominal aorta. Dilatation at the level of the aortic hiatus is noted slightly increased when compared with the prior exam. The degree of persistent opacification of the dissection is unable to be performed on this exam. Nonemergent CTA of the chest for aortic evaluation is recommended when able. Small pleural effusion on the right. Chronic interstitial scarring in the lungs bilaterally. Aortic Atherosclerosis (ICD10-I70.0). Aortic aneurysm NOS (ICD10-I71.9). Electronically Signed   By: Alcide Clever M.D.   On: 02/28/2019 02:14   Assessment/Plan The patient is an 85 year old male with multiple medical issues including a past medical history of abdominal aortic aneurysm and thoracic aortic aneurysm who presented to the [facility] emergency department with a chief complaint of "chest pain". 1. Thoracic Aneurysm: Reviewed CTA chest with Dr. Wyn Quaker.  Descending thoracic aortic aneurysm is essentially stable.  At this time, it is below the threshold for repair.  Recommend good control of the patient's hypertension, diabetes and hyperlipidemia.  We will follow as an outpatient for continued care. 2.  Abdominal aortic aneurysm: Seems to have been repaired in 2008 with Dr. Earnestine LeysHearn.  The patient denies any abdominal pain or back pain.  Most recent imaging in 2019 - [facility] with type II endoleak and possible bilateral iliac aneurysms.  Patient is asymptomatic at this time with  unremarkable physical exam.  Will see the patient in the outpatient setting for updated imaging and care. 3. Diabetes: On appropriate medications. Encouraged good control as its slows the progression of atherosclerotic disease 4. Hypertension: Encouraged good control as this will directly affect aneurysmal growth. 5. Hyperlipidemia: on ASA and statin for medical management. Encouraged good control as its slows the progression of atherosclerotic disease  Discussed with Dr. Weldon Inchesew  Joelie Schou A Nile Prisk, PA-C  02/28/2019 1:45 PM  This note was created with Dragon medical transcription system.  Any error is purely unintentional

## 2019-02-28 NOTE — Consult Note (Signed)
Reason for Consult: Non-STEMI elevated troponins Referring Physician: Dr. Mertie Moores hospitalist Dr. Elizabeth Sauer primary Cardiologist [name] is an 85 y.o. male.  HPI: Patient is a 85 year old white male history of coronary bypass surgery x4 diabetes type 2 dysuria hypertension chronic anginal symptoms mild ischemic cardiomyopathy treated medically is done reasonably well except for chronic daily stable angina with limited activity.  Patient normally is able to walk with his dog in the morning but now has recurrent symptoms which was 5 out of 10 and persistent did not allow him to sleep she came to the emergency room.  Patient now presents for further cardiac assessment chest pain is still persistent with elevated troponins.  Patient now developed severe what appears to be LAD obstruction hematuria and now needs further assessment patient states some dyspnea started with him using sildenafil  Past Medical History:  Diagnosis Date  . CAD (coronary artery disease)   . Diabetes mellitus (HCC)   . Gross hematuria   . Hyperlipidemia   . Hypertension   . Inguinal hernia   . Nodular prostate   . Syncope     Past Surgical History:  Procedure Laterality Date  . aneurysm surgery     x 2  . quadrupal bypass      Family History  Problem Relation Age of Onset  . Diabetes Mother   . Heart disease Father   . Stroke Father   . Cancer Brother   . Kidney disease Neg Hx   . Prostate cancer Neg Hx     Social History:  reports that he has quit smoking. He has never used smokeless tobacco. He reports that he does not drink alcohol or use drugs.  Allergies: No Known Allergies  Medications: I have reviewed the patient's current medications.  Results for orders placed or performed during the hospital encounter of 02/27/19 (from the past 48 hour(s))  Basic metabolic panel     Status: Abnormal   Collection Time: 02/27/19  6:27 PM  Result Value Ref Range   Sodium 141 135 -  145 mmol/L   Potassium 3.9 3.5 - 5.1 mmol/L   Chloride 110 98 - 111 mmol/L   CO2 18 (L) 22 - 32 mmol/L   Glucose, Bld 127 (H) 70 - 99 mg/dL   BUN 16 8 - 23 mg/dL   Creatinine, Ser 0.07 (H) 0.61 - 1.24 mg/dL   Calcium 9.5 8.9 - 62.2 mg/dL   GFR calc non Af Amer 52 (L) >60 mL/min   GFR calc Af Amer >60 >60 mL/min   Anion gap 13 5 - 15    Comment: Performed at [facility], [address] [place], [place] [address]  CBC     Status: Abnormal   Collection Time: 02/27/19  6:27 PM  Result Value Ref Range   WBC 6.6 4.0 - 10.5 K/uL   RBC 4.78 4.22 - 5.81 MIL/uL   Hemoglobin 14.3 13.0 - 17.0 g/dL   HCT 45.6 25.6 - 38.9 %   MCV 90.2 80.0 - 100.0 fL   MCH 29.9 26.0 - 34.0 pg   MCHC 33.2 30.0 - 36.0 g/dL   RDW 37.3 42.8 - 76.8 %   Platelets 106 (L) 150 - 400 K/uL    Comment: Immature Platelet Fraction may be clinically indicated, consider ordering this additional test [id number]    nRBC 0.0 0.0 - 0.2 %    Comment: Performed at [facility], [address] [place], [place]  [address]  Troponin I (High Sensitivity)     Status: Abnormal   Collection Time: 02/27/19  6:27 PM  Result Value Ref Range   Troponin I (High Sensitivity) 6,547 (HH) <18 ng/L    Comment: CRITICAL RESULT CALLED TO, READ BACK BY AND VERIFIED WITH ANGELA WHITESELL @1903  02/27/19 AKT (NOTE) Elevated high sensitivity troponin I (hsTnI) values and significant  changes across serial measurements may suggest ACS but many other  chronic and acute conditions are known to elevate hsTnI results.  Refer to the "Links" section for chest pain algorithms and additional  guidance. Performed at [facility], [address] [place], [place] [address]   Brain natriuretic peptide     Status: Abnormal   Collection Time: 02/27/19  6:27 PM  Result Value Ref Range   B Natriuretic Peptide 1,846.0 (H) 0.0 - 100.0 pg/mL    Comment: Performed at [facility], [address] [place], [place] [address]   Protime-INR     Status: Abnormal   Collection Time: 02/27/19  7:42 PM  Result Value Ref Range   Prothrombin Time 15.4 (H) 11.4 - 15.2 seconds   INR 1.2 0.8 - 1.2    Comment: (NOTE) INR goal varies based on device and disease states. Performed at [facility], [address] [place], [place] [address]   APTT     Status: Abnormal   Collection Time: 02/27/19  7:42 PM  Result Value Ref Range   aPTT 43 (H) 24 - 36 seconds    Comment:        IF BASELINE aPTT IS ELEVATED, SUGGEST PATIENT RISK ASSESSMENT BE USED TO DETERMINE APPROPRIATE ANTICOAGULANT THERAPY. Performed at [facility], [place], [place] [address]   Troponin I (High Sensitivity)     Status: Abnormal   Collection Time: 02/27/19  8:21 PM  Result Value Ref Range   Troponin I (High Sensitivity) 6,163 (HH) <18 ng/L    Comment: CRITICAL VALUE NOTED. VALUE IS CONSISTENT WITH PREVIOUSLY REPORTED/CALLED VALUE AKT (NOTE) Elevated high sensitivity troponin I (hsTnI) values and significant  changes across serial measurements may suggest ACS but many other  chronic and acute conditions are known to elevate hsTnI results.  Refer to the "Links" section for chest pain algorithms and additional  guidance. Performed at [facility], [address] [place], [place] [address]   Hemoglobin A1c     Status: Abnormal   Collection Time: 02/28/19 12:12 AM  Result Value Ref Range   Hgb A1c MFr Bld 6.5 (H) 4.8 - 5.6 %    Comment: (NOTE) Pre diabetes:          5.7%-6.4% Diabetes:              >6.4% Glycemic control for   <7.0% adults with diabetes    Mean Plasma Glucose 139.85 mg/dL    Comment: Performed at [facility] [address], [place], [place] [address]  Glucose, capillary     Status: None   Collection Time: 02/28/19 12:54 AM  Result Value Ref Range   Glucose-Capillary 91 70 - 99 mg/dL   Comment 1 Notify RN   Urinalysis, Complete w Microscopic     Status: Abnormal    Collection Time: 02/28/19  5:11 AM  Result Value Ref Range   Color, Urine COLORLESS (A) YELLOW   APPearance CLEAR (A) CLEAR   Specific Gravity, Urine 1.011 1.005 - 1.030   pH 5.0 5.0 - 8.0   Glucose, UA  NEGATIVE NEGATIVE mg/dL   Hgb urine dipstick SMALL (A) NEGATIVE   Bilirubin Urine NEGATIVE NEGATIVE   Ketones, ur NEGATIVE NEGATIVE mg/dL   Protein, ur NEGATIVE NEGATIVE mg/dL   Nitrite NEGATIVE NEGATIVE   Leukocytes,Ua NEGATIVE NEGATIVE   RBC / HPF 0-5 0 - 5 RBC/hpf   WBC, UA NONE SEEN 0 - 5 WBC/hpf   Bacteria, UA NONE SEEN NONE SEEN   Squamous Epithelial / LPF NONE SEEN 0 - 5    Comment: Performed at [facility], [address] [place], [place] [address]  Heparin level (unfractionated)     Status: None   Collection Time: 02/28/19  6:12 AM  Result Value Ref Range   Heparin Unfractionated 0.31 0.30 - 0.70 IU/mL    Comment: (NOTE) If heparin results are below expected values, and patient dosage has  been confirmed, suggest follow up testing of antithrombin III levels. Performed at [facility], [address] [place], [place] [address]   Troponin I (High Sensitivity)     Status: Abnormal   Collection Time: 02/28/19  6:12 AM  Result Value Ref Range   Troponin I (High Sensitivity) 5,698 (HH) <18 ng/L    Comment: CRITICAL VALUE NOTED. VALUE IS CONSISTENT WITH PREVIOUSLY REPORTED/CALLED VALUE JJB (NOTE) Elevated high sensitivity troponin I (hsTnI) values and significant  changes across serial measurements may suggest ACS but many other  chronic and acute conditions are known to elevate hsTnI results.  Refer to the "Links" section for chest pain algorithms and additional  guidance. Performed at [facility], [address] [place], [place] [address]     Dg Chest 2 View  Result Date: 02/27/2019 CLINICAL DATA:  Chest pain and shortness of breath EXAM: CHEST - 2 VIEW COMPARISON:  03/11/2012 FINDINGS: Mild cardiomegaly. Status post CABG. The lungs  are mildly hyperexpanded with diffusely increased interstitial markings. No pleural effusion or pneumothorax. No focal airspace consolidation or pulmonary edema. IMPRESSION: No active cardiopulmonary disease. Mild cardiomegaly and findings suggestive of COPD. Electronically Signed   By: Deatra RobinsonKevin  Herman M.D.   On: 02/27/2019 19:06   Ct Angio Chest Pe W Or Wo Contrast  Result Date: 02/28/2019 CLINICAL DATA:  Shortness of breath EXAM: CT ANGIOGRAPHY CHEST WITH CONTRAST TECHNIQUE: Multidetector CT imaging of the chest was performed using the standard protocol during bolus administration of intravenous contrast. Multiplanar CT image reconstructions and MIPs were obtained to evaluate the vascular anatomy. CONTRAST:  75mL OMNIPAQUE IOHEXOL 350 MG/ML SOLN COMPARISON:  Chest x-ray from previous day. FINDINGS: Cardiovascular: Thoracic aorta demonstrates atherosclerotic calcifications. No significant opacification of the aorta is seen. Enlargement of the descending aorta is again seen similar to that noted in 2013 related to previous dissection. Maximum diameter is 5.4 cm. No cardiac enlargement is noted. Pulmonary artery is well opacified within normal branching pattern. No definitive filling defects are identified to suggest pulmonary emboli. Changes of prior coronary bypass grafting are noted. Mediastinum/Nodes: Visualized esophagus is within normal limits. No hilar or mediastinal adenopathy is noted. The thoracic inlet is within normal limits. Lungs/Pleura: Small right-sided pleural effusion is noted. Chronic scarring is noted within both lungs. Upper Abdomen: Visualized upper abdomen demonstrates hypodensity within the right lobe of the liver stable from the prior exam. No other focal upper abdominal abnormality is seen. Musculoskeletal: Degenerative changes of the thoracic spine are noted. Review of the MIP images confirms the above findings. IMPRESSION: No evidence of pulmonary emboli. Changes consistent with the  known dissection involving the descending thoracic and  upper abdominal aorta. Dilatation at the level of the aortic hiatus is noted slightly increased when compared with the prior exam. The degree of persistent opacification of the dissection is unable to be performed on this exam. Nonemergent CTA of the chest for aortic evaluation is recommended when able. Small pleural effusion on the right. Chronic interstitial scarring in the lungs bilaterally. Aortic Atherosclerosis (ICD10-I70.0). Aortic aneurysm NOS (ICD10-I71.9). Electronically Signed   By: Alcide Clever M.D.   On: 02/28/2019 02:14    Review of Systems  Constitutional: Positive for malaise/fatigue.  HENT: Positive for congestion.   Eyes: Negative.   Respiratory: Positive for shortness of breath.   Cardiovascular: Positive for palpitations and leg swelling.  Gastrointestinal: Negative.   Genitourinary: Positive for dysuria, frequency and hematuria.  Musculoskeletal: Positive for myalgias.  Skin: Negative.   Neurological: Negative.   Endo/Heme/Allergies: Negative.   Psychiatric/Behavioral: Negative.    Blood pressure 127/85, pulse 67, temperature 98 F (36.7 C), temperature source Oral, resp. rate 20, height  (1.753 m), weight 84.1 kg, SpO2 96 %. Physical Exam  Nursing note and vitals reviewed. Constitutional: He is oriented to person, place, and time. He appears well-developed and well-nourished.  HENT:  Head: Normocephalic and atraumatic.  Eyes: Pupils are equal, round, and reactive to light. Conjunctivae and EOM are normal.  Neck: Normal range of motion. Neck supple.  Cardiovascular: Normal rate, regular rhythm and normal heart sounds.  Respiratory: Effort normal and breath sounds normal.  GI: Soft. Bowel sounds are normal.  Genitourinary: Penile tenderness present.  Musculoskeletal: Normal range of motion.        General: Edema present.  Neurological: He is alert and oriented to person, place, and time. He has normal  reflexes.  Skin: Skin is warm and dry.  Psychiatric: He has a normal mood and affect.    Assessment/Plan: NSTEMI CABG x 4 CAD HTN Hematuria Dysuria Chronic angina Diabetes type 2 Hyperlipidemia COPD SOB Former smoker . Plan Agree with telemetry Continue supplemental heparin Follow-up troponins and EKGs Consider echocardiogram for left ventricular function wall motion Consider cardiac cath with elevated troponins Probable occlusion of bypass graft Increase Imdur to 60 mg once a day for chronic anginal symptoms Consider adding Ranexa 500 mg twice a day Continue statin therapy with Crestor for lipid management Agree with Coreg aspirin Norvasc lisinopril Crestor spironolactone Ischemic cardiomyopathy continue medical therapy Continue Metformin glipizide for diabetes management and control Consult urology for urinary obstruction hematuria Insert Foley catheter to help with obstruction   Kenneth Mitchell 02/28/2019, 7:59 AM

## 2019-02-28 NOTE — Progress Notes (Signed)
Late Entry: Pt arrived from ED via stretcher at 2325.  Irritable on arrival.  Declines to remove clothing for skin assessment.  Refusing initially to have labs drawn, IV insertion for CT scan  and refusing medications.  Cooperative with lab draw but only after much coaxing after explanation given for why labs needed.

## 2019-02-28 NOTE — Progress Notes (Signed)
PROGRESS NOTE    Kenneth Mitchell  ERD:[id number] DOB: 12/31/1933 DOA: 02/27/2019 PCP: Duanne Limerick, MD    Brief Narrative:   Kenneth Mitchell  is a 85 y.o.Caucasian male with a known history of coronary disease status post four-vessel CABG, type 2 diabetes mellitus, dyslipidemia and hypertension.  Who presented to the emergency room with acute onset of midsternal chest pain felt as pressure and tightness with agitated dyspnea which has been intermittent for a while.  The patient could not sleep last night.  He denies any nausea or vomiting or diaphoresis.  He graded his pain as 5/10 in severity.  No cough or wheezing or hemoptysis.  He denies any palpitations.  No fever or chills or recent sick exposures.  Upon presentation to the emergency room, blood pressure was 167/115 with respiratory to 24 and otherwise normal vital signs.  Labs were remarkable for a creatinine of 1.26 and a significantly elevated high-sensitivity troponin I of 6547 with  thrombocytopenia with platelets of 106 otherwise normal CBC.  Two-view chest ray showed mild cardiomegaly with findings suggestive of COPD with no acute cardiopulmonary disease.  The patient was given 4 baby aspirin and 1 sublingual nitroglycerin.  He will be admitted to a telemetry bed for further evaluation and management.  Interim History: -11/9: Card was consulted, s/p of cardic cath -11/9 pending 2d echo -11/9 VVS  Consulted due to Descending aortic dissection - 11/9 Urology was consulted due to urinary retention and hematuria -->Retain Foley; outpatient trial of void in 10-14 days  Assessment & Plan:   Principal Problem:   NSTEMI (non-ST elevated myocardial infarction) [facility]) Active Problems:   Dyslipidemia   Coronary artery disease involving coronary bypass graft of native heart with angina pectoris (HCC)   Hypertension   Aortic dissection, thoracoabdominal (HCC)   Acute on chronic systolic CHF (congestive heart failure) (HCC)  Thrombocytopenia (HCC)   Type II diabetes mellitus with renal manifestations (HCC)   Hematuria   Liver lesion   CKD (chronic kidney disease), stage IIIa   NSTEMI and coronary artery disease involving coronary bypass graft of native heart with angina pectoris [facility]): Troponin 5690 --> 5647 --> 5441 --> 7179. Card is consulted. S/p of cardiac cath. Per Dr. Glennis Brink note with following findings:  status post cardiac cath with multivessel disease high-grade lesion in graft to OM  Subclavian appears to be occluded difficult to access  Known occlusion of right graft and right coronary artery  Mild to moderately depressed left ventricular function with inferior akinesis EF of around 35 to 40%  Urinary obstruction continue Foley follow-up urology's recommendation  We will refer films to do and consider stage further possible intervention upon their recommendations  - Highly appreciated cardiologist consultation - initially treated with IV heparin - pending 2d echo - Imdur to 60 mg daily - Ranexa 500 mg twice a day - Crestor  Dyslipidemia:  -Crestor  Hypertension: -Amlodipine, Coreg, lisinopril  Aortic dissection, thoracoabdominal (HCC): VVS, Dr. Geri Seminole was consulted, which highly appreciated. Per Dr.  Geri Seminole, "At this time, it is below the threshold for repair.  Recommend good control of the patient's hypertension, diabetes and hyperlipidemia.  We will follow as an outpatient for continued care". Pt also has abdominal aortic aneurysm.  Will see the patient in the outpatient setting for updated imaging and care. -f/u with VVs  Acute on chronic systolic CHF (congestive heart failure) (HCC): BNP 1846. pending 2d echo.  -lasix 40 mg bid IV -Spironolactone  Thrombocytopenia (HCC):  this is chronic issue.  Platelet 106 today.  No active bleeding tendency. -Follow-up with CBC  Type II diabetes mellitus with renal manifestations [facility]): Patient is taking glipizide and Metformin at  home -SSI  Hematuria and urinary retention: Urology, Carman Ching, PA-C and Dr. Apolinar Junes were consutled, highly appreciated. -Retain Foley; outpatient trial of void in 10-14 days  CKD-IIIa: stable.  Baseline creatinine 1.2-1.4. his Cre 1.26 and BUN 16. Follow-up of BMP  Liver lesion: shown by CTA of chest incidentally, hypodensity within the right lobe of the liver stable from the prior exam.  -f/u with PCP      DVT prophylaxis: sq Heparin Code Status: fall Family Communication: daughter at bedside Disposition Plan: likely d/c in 1 or 2 days. Barriers for discharge:    Consultants:   Cardiology  Urology  Vascular surgeon  Procedures:  Cardiac cath  Antimicrobials:   Subjective:  Patient has mild chest pain, no shortness of breath.  Reports difficulty urinating and hematuria.  Denies nausea vomiting or diarrhea or abdominal pain.  No fever or chills.  Objective: Vitals:   02/28/19 1630 02/28/19 1703 02/28/19 1722 02/28/19 1948  BP: (!) 143/79 112/85 137/71 106/63  Pulse: 63 (!) 56 (!) 58 63  Resp: (!) 24 (!) 22    Temp:   98.6 F (37 C) 98.2 F (36.8 C)  TempSrc:   Oral Oral  SpO2: 94% 98% 96% 93%  Weight:      Height:        Intake/Output Summary (Last 24 hours) at 02/28/2019 1949 Last data filed at 02/28/2019 1812 Gross per 24 hour  Intake 2892.2 ml  Output 2950 ml  Net -57.8 ml   Filed Weights   02/27/19 2021 02/28/19 0347  Weight: 86.2 kg 84.1 kg    Examination: Physical Exam:  General: Not in acute distress HEENT: PERRL, EOMI, no scleral icterus, No JVD or bruit Cardiac: S1/S2, RRR, No murmurs, gallops or rubs Pulm: Good air movement bilaterally. Clear to auscultation bilaterally. No rales, wheezing, rhonchi or rubs. Abd: Soft, nondistended, nontender, no rebound pain, no organomegaly, BS present Ext: No edema. 2+DP/PT pulse bilaterally Musculoskeletal: No joint deformities, erythema, or stiffness, ROM full Skin: No rashes.   Neuro: Alert and oriented X3, cranial nerves II-XII grossly intact, muscle strength 5/5 in all extremeties, sensation to light touch intact. Brachial reflex 2+ bilaterally. Knee reflex 1+ bilaterally. Negative Babinski's sign. Normal finger to nose test. Psych: Patient is not psychotic, no suicidal or hemocidal ideation.    Data Reviewed: I have personally reviewed following labs and imaging studies  CBC: Recent Labs  Lab 02/27/19 1827  WBC 6.6  HGB 14.3  HCT 43.1  MCV 90.2  PLT 106*   Basic Metabolic Panel: Recent Labs  Lab 02/27/19 1827  NA 141  K 3.9  CL 110  CO2 18*  GLUCOSE 127*  BUN 16  CREATININE 1.26*  CALCIUM 9.5   GFR: Estimated Creatinine Clearance: 44.4 mL/min (A) (by C-G formula based on SCr of 1.26 mg/dL (H)). Liver Function Tests: No results for input(s): AST, ALT, ALKPHOS, BILITOT, PROT, ALBUMIN in the last 168 hours. No results for input(s): LIPASE, AMYLASE in the last 168 hours. No results for input(s): AMMONIA in the last 168 hours. Coagulation Profile: Recent Labs  Lab 02/27/19 1942  INR 1.2   Cardiac Enzymes: No results for input(s): CKTOTAL, CKMB, CKMBINDEX, TROPONINI in the last 168 hours. BNP (last 3 results) No results for input(s): PROBNP in the last 8760 hours. HbA1C:  Recent Labs    02/28/19 0012  HGBA1C 6.5*   CBG: Recent Labs  Lab 02/28/19 0054 02/28/19 0842 02/28/19 1136 02/28/19 1740  GLUCAP 91 142* 135* 89   Lipid Profile: No results for input(s): CHOL, HDL, LDLCALC, TRIG, CHOLHDL, LDLDIRECT in the last 72 hours. Thyroid Function Tests: No results for input(s): TSH, T4TOTAL, FREET4, T3FREE, THYROIDAB in the last 72 hours. Anemia Panel: No results for input(s): VITAMINB12, FOLATE, FERRITIN, TIBC, IRON, RETICCTPCT in the last 72 hours. Sepsis Labs: No results for input(s): PROCALCITON, LATICACIDVEN in the last 168 hours.  Recent Results (from the past 240 hour(s))  SARS CORONAVIRUS 2 (TAT 6-24 HRS) Nasopharyngeal  Nasopharyngeal Swab     Status: None   Collection Time: 02/27/19  9:36 PM   Specimen: Nasopharyngeal Swab  Result Value Ref Range Status   SARS Coronavirus 2 NEGATIVE NEGATIVE Final    Comment: (NOTE) SARS-CoV-2 target nucleic acids are NOT DETECTED. The SARS-CoV-2 RNA is generally detectable in upper and lower respiratory specimens during the acute phase of infection. Negative results do not preclude SARS-CoV-2 infection, do not rule out co-infections with other pathogens, and should not be used as the sole basis for treatment or other patient management decisions. Negative results must be combined with clinical observations, patient history, and epidemiological information. The expected result is Negative. Fact Sheet for Patients: HairSlick.nohttps://www.fda.gov/media/138098/download Fact Sheet for Healthcare Providers: quierodirigir.comhttps://www.fda.gov/media/138095/download This test is not yet approved or cleared by the [place] FDA and  has been authorized for detection and/or diagnosis of SARS-CoV-2 by FDA under an Emergency Use Authorization (EUA). This EUA will remain  in effect (meaning this test can be used) for the duration of the COVID-19 declaration under Section 56 4(b)(1) of the Act, 21 U.S.C. section 360bbb-3(b)(1), unless the authorization is terminated or revoked sooner. Performed at [facility], [address] [place], [place] [address]      Radiology Studies: Dg Chest 2 View  Result Date: 02/27/2019 CLINICAL DATA:  Chest pain and shortness of breath EXAM: CHEST - 2 VIEW COMPARISON:  03/11/2012 FINDINGS: Mild cardiomegaly. Status post CABG. The lungs are mildly hyperexpanded with diffusely increased interstitial markings. No pleural effusion or pneumothorax. No focal airspace consolidation or pulmonary edema. IMPRESSION: No active cardiopulmonary disease. Mild cardiomegaly and findings suggestive of COPD. Electronically Signed   By: Deatra RobinsonKevin  Herman M.D.   On: 02/27/2019 19:06     Ct Angio Chest Pe W Or Wo Contrast  Result Date: 02/28/2019 CLINICAL DATA:  Shortness of breath EXAM: CT ANGIOGRAPHY CHEST WITH CONTRAST TECHNIQUE: Multidetector CT imaging of the chest was performed using the standard protocol during bolus administration of intravenous contrast. Multiplanar CT image reconstructions and MIPs were obtained to evaluate the vascular anatomy. CONTRAST:  75mL OMNIPAQUE IOHEXOL 350 MG/ML SOLN COMPARISON:  Chest x-ray from previous day. FINDINGS: Cardiovascular: Thoracic aorta demonstrates atherosclerotic calcifications. No significant opacification of the aorta is seen. Enlargement of the descending aorta is again seen similar to that noted in 2013 related to previous dissection. Maximum diameter is 5.4 cm. No cardiac enlargement is noted. Pulmonary artery is well opacified within normal branching pattern. No definitive filling defects are identified to suggest pulmonary emboli. Changes of prior coronary bypass grafting are noted. Mediastinum/Nodes: Visualized esophagus is within normal limits. No hilar or mediastinal adenopathy is noted. The thoracic inlet is within normal limits. Lungs/Pleura: Small right-sided pleural effusion is noted. Chronic scarring is noted within both lungs. Upper Abdomen: Visualized upper abdomen demonstrates hypodensity within the right lobe  of the liver stable from the prior exam. No other focal upper abdominal abnormality is seen. Musculoskeletal: Degenerative changes of the thoracic spine are noted. Review of the MIP images confirms the above findings. IMPRESSION: No evidence of pulmonary emboli. Changes consistent with the known dissection involving the descending thoracic and upper abdominal aorta. Dilatation at the level of the aortic hiatus is noted slightly increased when compared with the prior exam. The degree of persistent opacification of the dissection is unable to be performed on this exam. Nonemergent CTA of the chest for aortic evaluation  is recommended when able. Small pleural effusion on the right. Chronic interstitial scarring in the lungs bilaterally. Aortic Atherosclerosis (ICD10-I70.0). Aortic aneurysm NOS (ICD10-I71.9). Electronically Signed   By: Inez Catalina M.D.   On: 02/28/2019 02:14        Scheduled Meds:  alum & mag hydroxide-simeth  30 mL Oral Once   And   lidocaine  15 mL Oral Once   amLODipine  10 mg Oral Q0600   aspirin EC  325 mg Oral Daily   carvedilol  6.25 mg Oral BID WC   Chlorhexidine Gluconate Cloth  6 each Topical Daily   furosemide  40 mg Intravenous Q12H   heparin injection (subcutaneous)  5,000 Units Subcutaneous Q8H   insulin aspart  0-9 Units Subcutaneous TID WC   [START ON 03/01/2019] isosorbide mononitrate  60 mg Oral Q0600   lisinopril  5 mg Oral Daily   nitroGLYCERIN  0.2 mg Transdermal Daily   pantoprazole  40 mg Oral Daily   ranolazine  500 mg Oral BID   rosuvastatin  10 mg Oral Daily   sodium chloride flush  3 mL Intravenous Q12H   [START ON 03/01/2019] sodium chloride flush  3 mL Intravenous Q12H   spironolactone  25 mg Oral Daily   Continuous Infusions:  [START ON 03/01/2019] sodium chloride     sodium chloride 1 mL/kg/hr (02/28/19 1752)     LOS: 1 day    Time spent: 30 min    Ivor Costa, DO [facility] PAGER is on AMION  If 7PM-7AM, please contact night-coverage [email or web address] Password [facility] 02/28/2019, 7:49 PM

## 2019-03-01 ENCOUNTER — Encounter: Payer: Self-pay | Admitting: Internal Medicine

## 2019-03-01 DIAGNOSIS — R778 Other specified abnormalities of plasma proteins: Secondary | ICD-10-CM

## 2019-03-01 DIAGNOSIS — I7103 Dissection of thoracoabdominal aorta: Secondary | ICD-10-CM

## 2019-03-01 LAB — TROPONIN I (HIGH SENSITIVITY): Troponin I (High Sensitivity): 8599 ng/L (ref <18)

## 2019-03-01 LAB — CBC
HCT: 39.6 % (ref 39.0–52.0)
Hemoglobin: 13.2 g/dL (ref 13.0–17.0)
MCH: 30.1 pg (ref 26.0–34.0)
MCHC: 33.3 g/dL (ref 30.0–36.0)
MCV: 90.2 fL (ref 80.0–100.0)
Platelets: 122 10*3/uL — ABNORMAL LOW (ref 150–400)
RBC: 4.39 MIL/uL (ref 4.22–5.81)
RDW: 13.4 % (ref 11.5–15.5)
WBC: 8.2 10*3/uL (ref 4.0–10.5)
nRBC: 0 % (ref 0.0–0.2)

## 2019-03-01 LAB — BASIC METABOLIC PANEL
Anion gap: 10 (ref 5–15)
BUN: 20 mg/dL (ref 8–23)
CO2: 25 mmol/L (ref 22–32)
Calcium: 9.3 mg/dL (ref 8.9–10.3)
Chloride: 103 mmol/L (ref 98–111)
Creatinine, Ser: 1.53 mg/dL — ABNORMAL HIGH (ref 0.61–1.24)
GFR calc Af Amer: 48 mL/min — ABNORMAL LOW (ref >60)
GFR calc non Af Amer: 41 mL/min — ABNORMAL LOW (ref >60)
Glucose, Bld: 151 mg/dL — ABNORMAL HIGH (ref 70–99)
Potassium: 3.5 mmol/L (ref 3.5–5.1)
Sodium: 138 mmol/L (ref 135–145)

## 2019-03-01 LAB — GLUCOSE, CAPILLARY
Glucose-Capillary: 131 mg/dL — ABNORMAL HIGH (ref 70–99)
Glucose-Capillary: 155 mg/dL — ABNORMAL HIGH (ref 70–99)

## 2019-03-01 LAB — MAGNESIUM: Magnesium: 1.7 mg/dL (ref 1.7–2.4)

## 2019-03-01 MED ORDER — ISOSORBIDE MONONITRATE ER 60 MG PO TB24: 60.0000 mg | ORAL_TABLET | Freq: Every day | ORAL | 1 refills | Status: AC

## 2019-03-01 MED ORDER — ASPIRIN 325 MG PO TBEC: 325.0000 mg | DELAYED_RELEASE_TABLET | Freq: Every day | ORAL | 1 refills | Status: AC

## 2019-03-01 MED ORDER — PANTOPRAZOLE SODIUM 40 MG PO TBEC: 40.0000 mg | DELAYED_RELEASE_TABLET | Freq: Every day | ORAL | 1 refills | Status: AC

## 2019-03-01 MED ORDER — RANOLAZINE ER 1000 MG PO TB12: 500.0000 mg | ORAL_TABLET | Freq: Two times a day (BID) | ORAL | 1 refills | Status: DC

## 2019-03-01 NOTE — Discharge Instructions (Signed)
You were cared for by a hospitalist during your hospital stay. If you have any questions about your discharge medications or the care you received while you were in the hospital after you are discharged, you can call the unit and ask to speak with the hospitalist on call if the hospitalist that took care of you is not available. Once you are discharged, your primary care physician will handle any further medical issues. Please note that NO REFILLS for any discharge medications will be authorized once you are discharged, as it is imperative that you return to your primary care physician (or establish a relationship with a primary care physician if you do not have one) for your aftercare needs so that they can reassess your need for medications and monitor your lab values.  Follow up with PCP, neurologist, cardiologist, vascular surgeon. Take all medications as prescribed. If symptoms change or worsen please return to the ED for evaluation

## 2019-03-01 NOTE — Progress Notes (Signed)
Patient discharged home with daughter. Patient educated on discharge instructions and verbalized understanding.

## 2019-03-01 NOTE — Evaluation (Signed)
Physical Therapy Evaluation Patient Details Name: KRISTOPH SATTLER MRN: [id number] DOB: 1933-06-17 Today's Date: 03/01/2019   History of Present Illness  Patient is 85 yo male that presented to ED for chest pain, underwent cardiac cath 02/28/2019. PMH includes abdominal and thoracic aneurysm (x2), quadrupal bypass, DM, HTN, HLD, nodular prostate.    Clinical Impression  Patient in room with family at bedside, very eager for hospital discharge. Family and pt reported that he lives alone, able to live on the first floor, independent for ADLs/IADLs. Uses external support to maintain balance, does not have any interest in using a walker or a cane. No falls in the last 6 months per patient.  The patient demonstrated bed mobility mod I with extended time and use of grab bars. Sit <> stand mod I twice this session, reached for external support to complete. Ambulated ~106ft total (sitting rest break at edge of bed ~68ft), second trial improved balance noted. Pt utilized external support, no LOB noted, decreased gait velocity. Pt reported he is not back at his baseline yet for ambulation.  Overall the patient demonstrated deficits (see "PT Problem List") that impede the patient's functional abilities, safety, and mobility and would benefit from skilled PT intervention. Recommendation is HHPT to maximize mobility and safety.     Follow Up Recommendations Home health PT    Equipment Recommendations  None recommended by PT    Recommendations for Other Services       Precautions / Restrictions Precautions Precautions: Fall Restrictions Weight Bearing Restrictions: No      Mobility  Bed Mobility Overal bed mobility: Modified Independent                Transfers Overall transfer level: Modified independent               General transfer comment: use of hands to steady and to complete transfer  Ambulation/Gait Ambulation/Gait assistance: Modified independent (Device/Increase  time) Gait Distance (Feet): 45 Feet Assistive device: None       General Gait Details: Pt reaches for external support, no LOB noted, decreased gait velocity. Pt reported he is not back at his baseline yet for ambulation.  Stairs            Wheelchair Mobility    Modified Rankin (Stroke Patients Only)       Balance Overall balance assessment: Needs assistance Sitting-balance support: Feet supported Sitting balance-Leahy Scale: Good       Standing balance-Leahy Scale: Good                               Pertinent Vitals/Pain Pain Assessment: No/denies pain    Home Living Family/patient expects to be discharged to:: Private residence Living Arrangements: Alone Available Help at Discharge: Neighbor;Available PRN/intermittently Type of Home: House Home Access: Stairs to enter Entrance Stairs-Rails: None(uses freezer next to steps) Entrance Stairs-Number of Steps: 2 Home Layout: Two level;Able to live on main level with bedroom/bathroom Home Equipment: Gilmer Mor - single point      Prior Function Level of Independence: Independent         Comments: no falls in the last 6 months, drives, grocery shops, cooks     Hand Dominance        Extremity/Trunk Assessment   Upper Extremity Assessment Upper Extremity Assessment: Overall WFL for tasks assessed    Lower Extremity Assessment Lower Extremity Assessment: Generalized weakness    Cervical / Trunk Assessment Cervical /  Trunk Assessment: Normal  Communication   Communication: No difficulties  Cognition Arousal/Alertness: Awake/alert Behavior During Therapy: WFL for tasks assessed/performed Overall Cognitive Status: Within Functional Limits for tasks assessed                                        General Comments      Exercises     Assessment/Plan    PT Assessment Patient needs continued PT services  PT Problem List Decreased strength;Decreased mobility;Decreased  safety awareness;Decreased activity tolerance;Decreased balance       PT Treatment Interventions DME instruction;Therapeutic exercise;Gait training;Balance training;Neuromuscular re-education;Stair training;Functional mobility training;Therapeutic activities;Patient/family education    PT Goals (Current goals can be found in the Care Plan section)  Acute Rehab PT Goals Patient Stated Goal: to go home PT Goal Formulation: With patient Time For Goal Achievement: 03/15/19 Potential to Achieve Goals: Good    Frequency Min 2X/week   Barriers to discharge        Co-evaluation               AM-PAC PT "6 Clicks" Mobility  Outcome Measure Help needed turning from your back to your side while in a flat bed without using bedrails?: None Help needed moving from lying on your back to sitting on the side of a flat bed without using bedrails?: None Help needed moving to and from a bed to a chair (including a wheelchair)?: None Help needed standing up from a chair using your arms (e.g., wheelchair or bedside chair)?: None Help needed to walk in hospital room?: A Little Help needed climbing 3-5 steps with a railing? : A Little 6 Click Score: 22    End of Session Equipment Utilized During Treatment: Gait belt Activity Tolerance: Patient tolerated treatment well Patient left: in bed;with family/visitor present Nurse Communication: Mobility status PT Visit Diagnosis: Unsteadiness on feet (R26.81);Difficulty in walking, not elsewhere classified (R26.2);Muscle weakness (generalized) (M62.81)    Time: 0177-9390 PT Time Calculation (min) (ACUTE ONLY): 13 min   Charges:   PT Evaluation $PT Eval Moderate Complexity: 1 Mod          Lieutenant Diego PT, DPT 11:11 AM,03/01/19 [phone]

## 2019-03-01 NOTE — Progress Notes (Deleted)
Patient sent home with a standard drainage bag, leg bag, and foley care. Educated patient and daughter on proper foley care prior to discharge.

## 2019-03-01 NOTE — Progress Notes (Signed)
Patient refused labs. Patient refused foley care and  CHG bath. RN educated patient and patient stated "I don't need it."

## 2019-03-01 NOTE — Progress Notes (Signed)
OT Cancellation Note  Patient Details Name: Kenneth Mitchell MRN: [id number] DOB: 01-Feb-1934   Cancelled Treatment:    Reason Eval/Treat Not Completed: OT screened, no needs identified, will sign off Order received and chart reviewed.  Pt in bed with his daughter in room and did not need any OT services at this time and screening only completed.  Pt stated "he did not need a damn thing" and just wanted to go home.  Please re-consult if any further needs arise.  Chrys Racer, OTR/L, NTMTC ascom [phone] 03/01/19, 11:37 AM

## 2019-03-01 NOTE — Progress Notes (Signed)
Patient sent home with a standard drainage bag, leg bag, and foley care. Educated patient and daughter on proper foley care prior to discharge. 

## 2019-03-01 NOTE — Progress Notes (Signed)
Pt refused to have labs drawn this am; lab personnel will try again later.

## 2019-03-01 NOTE — Discharge Summary (Signed)
Physician Discharge Summary  [name] WJX:[id number] DOB: [birth year] DOA: 02/27/2019  PCP: Duanne Limerick, MD  Admit date: 02/27/2019 Discharge date: 03/01/2019  Recommendations for Outpatient Follow-up:  1. Follow up with PCP in 1 weeks 2. Please obtain BMP/CBC in one week 3.  Please follow up on the following pending results: 2d ehco 4.  Please follow-up with urologist and vascular surgeon  5.  Please follow-up with cardiologist  Home Health: HHPT  Equipment/Devices: none  Discharge Condition: stable CODE STATUS: full  Diet recommendation: heart health  Brief/Interim Summary (HPI)  Kenneth Mitchell  is a 85 y.o.Caucasian male with a known history of coronary disease status post four-vessel CABG, type 2 diabetes mellitus, dyslipidemia and hypertension.  Who presented to the emergency room with acute onset of midsternal chest pain felt as pressure and tightness with agitated dyspnea which has been intermittent for a while.  The patient could not sleep last night.  He denies any nausea or vomiting or diaphoresis.  He graded his pain as 5/10 in severity.  No cough or wheezing or hemoptysis.  He denies any palpitations.  No fever or chills or recent sick exposures.  Upon presentation to the emergency room, blood pressure was 167/115 with respiratory to 24 and otherwise normal vital signs.  Labs were remarkable for a creatinine of 1.26 and a significantly elevated high-sensitivity troponin I of 6547 with  thrombocytopenia with platelets of 106 otherwise normal CBC.  Two-view chest ray showed mild cardiomegaly with findings suggestive of COPD with no acute cardiopulmonary disease.  The patient was given 4 baby aspirin and 1 sublingual nitroglycerin.  He will be admitted to a telemetry bed for further evaluation and management.   Discharge Diagnoses and Hospital Course:   Principal Problem:   NSTEMI (non-ST elevated myocardial infarction) [facility]) Active Problems:   Dyslipidemia   Coronary  artery disease involving coronary bypass graft of native heart with angina pectoris (HCC)   Hypertension   Aortic dissection, thoracoabdominal (HCC)   Acute on chronic systolic CHF (congestive heart failure) (HCC)   Thrombocytopenia (HCC)   Type II diabetes mellitus with renal manifestations (HCC)   Hematuria   Liver lesion   CKD (chronic kidney disease), stage IIIa   Chest pain    NSTEMI and coronary artery disease involving coronary bypass graft of native heart with angina pectoris [facility]): Troponin 5690 --> 5647 --> 5441 --> 7179. Card is consulted. S/p of cardiac cath. Per Dr. Glennis Brink note with following findings:  status post cardiac cath with multivessel disease high-grade lesion in graft to OM  Subclavian appears to be occluded difficult to access  Known occlusion of right graft and right coronary artery  Mild to moderately depressed left ventricular function with inferior akinesis EF of around 35 to 40%  Urinary obstruction continue Foley follow-up urology's recommendation  We will refer films to do and consider stage further possible intervention upon their recommendations  - initially treated with IV heparin -->off iv heparin - Imdur to 60 mg daily - Ranexa 500 mg twice a day -->increased to 1000 mg bid - Crestor - prn NGT - pending 2d echo  Dyslipidemia:  -Crestor  Hypertension: -Amlodipine, Coreg, lisinopril  Aortic dissection, thoracoabdominal (HCC): [facility], Dr. Geri Seminole was consulted, which highly appreciated. Per Dr.  Geri Seminole, "At this time, it is below the threshold for repair. Recommend good control of the patient's hypertension, diabetes and hyperlipidemia. We will follow as an outpatient for continued care". Pt also has abdominal aortic aneurysm. Will see  the patient in the outpatient setting for updated imaging and care. -f/u with [facility]  Acute on chronic systolic CHF (congestive heart failure) (HCC): BNP 1846. pending 2d echo.  -lasix 40 mg bid  IV -->change to oral meds at discharge -Spironolactone  Thrombocytopenia (HCC): this is chronic issue.  No active bleeding tendency. -Follow-up with CBC  Type II diabetes mellitus with renal manifestations [facility](HCC): Patient is taking glipizide and Metformin at home -continue home med  Hematuria and urinary retention: Urology, Carman ChingVaillancourt, Samantha, PA-C and Dr. Apolinar JunesBrandon were consutled, highly appreciated. -Retain Foley; outpatient trial of void in 10-14 days -f/u with urology  CKD-IIIa: stable.  Follow-up of BMP  Liver lesion: shown by CTA of chest incidentally, hypodensity within the right lobe of the liver stable from the prior exam.  -f/u with PCP   Discharge Instructions  Discharge Instructions    Amb Referral to Cardiac Rehabilitation   Complete by: As directed    [facility] for possible further treatment and interventions.  Physical therapy recommending in-home Physical Therapy upon discharge.   Diagnosis: NSTEMI   After initial evaluation and assessments completed: Virtual Based Care may be provided alone or in conjunction with Phase 2 Cardiac Rehab based on patient barriers.: Yes   Call MD for:  difficulty breathing, headache or visual disturbances   Complete by: As directed    Call MD for:  extreme fatigue   Complete by: As directed    Call MD for:  persistant dizziness or light-headedness   Complete by: As directed    Call MD for:  severe uncontrolled pain   Complete by: As directed    Call MD for:  temperature >100.4   Complete by: As directed    Diet - low sodium heart healthy   Complete by: As directed    Increase activity slowly   Complete by: As directed      Allergies as of 03/01/2019   No Known Allergies     Medication List    STOP taking these medications   aspirin 81 MG tablet Replaced by: aspirin 325 MG EC tablet     TAKE these medications   amLODipine 10 MG tablet Commonly known as: NORVASC Take 1  tablet (10 mg total) by mouth daily at 6 (six) AM.   aspirin 325 MG EC tablet Take 1 tablet (325 mg total) by mouth daily. Start taking on: March 02, 2019 Replaces: aspirin 81 MG tablet   carvedilol 6.25 MG tablet Commonly known as: COREG Take 1 tablet (6.25 mg total) by mouth 2 (two) times daily with a meal.   furosemide 40 MG tablet Commonly known as: LASIX Take 1 tablet (40 mg total) by mouth daily.   glipiZIDE 5 MG tablet Commonly known as: GLUCOTROL TAKE 1 TABLET BY MOUTH TWICE DAILY BEFORE A MEAL   hydrOXYzine 10 MG tablet Commonly known as: ATARAX/VISTARIL Take 1 tablet (10 mg total) by mouth 3 (three) times daily as needed.   isosorbide mononitrate 60 MG 24 hr tablet Commonly known as: IMDUR Take 1 tablet (60 mg total) by mouth daily at 6 (six) AM. Start taking on: March 02, 2019 What changed:   medication strength  how much to take   lisinopril 5 MG tablet Commonly known as: ZESTRIL Take 1 tablet (5 mg total) by mouth daily.   metFORMIN 500 MG tablet Commonly known as: GLUCOPHAGE Take 1 tablet (500 mg total) by mouth 2 (two) times daily with a meal.  nitroGLYCERIN 0.2 mg/hr patch Commonly known as: NITRODUR - Dosed in mg/24 hr UNWRAP AND APPLY 1 PATCH ONCE DAILY, LEAVE ON FOR 12-14 HOURS(WAIT 10-12 HOURS BEFORE NEXT PATCH)   pantoprazole 40 MG tablet Commonly known as: PROTONIX Take 1 tablet (40 mg total) by mouth daily. Start taking on: March 02, 2019   ranolazine 1000 MG SR tablet Commonly known as: RANEXA Take 1 tablet (1,000 mg total) by mouth 2 (two) times daily.   rosuvastatin 10 MG tablet Commonly known as: CRESTOR Take 1 tablet (10 mg total) by mouth daily.   spironolactone 25 MG tablet Commonly known as: ALDACTONE Take 1 tablet (25 mg total) by mouth daily.      Follow-up Information    Dew, Marlow Baars, MD Follow up in 2 week(s).   Specialties: Vascular Surgery, Radiology, Interventional Cardiology Why: To see Dew. Review CTA  chest. Patient will need aorto-iliac duplex and ABI with visit.  Contact information: [address] [place] [address] [phone]        Duanne Limerick, MD Follow up in 1 week(s).   Specialty: Family Medicine Contact information: [address] [place] [address] [phone]        [facility] Follow up.   Specialty: Cardiac Rehabilitation Why: Your cardiologist has referred you to outpatient Cardiac Rehab at [facility].  Physical Therapy has recommended in-home physical therapy, which would need to be completed first prior to coming to Cardiac Rehab.  Contact information: [id number] [place] [address] [phone]       Dorothyann Peng D, MD Follow up in 2 week(s).   Specialties: Cardiology, Internal Medicine Contact information: [address] [place] [address] [phone]        Carman Ching, PA-C Follow up in 1 week(s).   Specialty: Urology Contact information: [address] [place] [address] [phone]          No Known Allergies  Consultations:  Card  VVS  urology   Procedures/Studies: Dg Chest 2 View  Result Date: 02/27/2019 CLINICAL DATA:  Chest pain and shortness of breath EXAM: CHEST - 2 VIEW COMPARISON:  03/11/2012 FINDINGS: Mild cardiomegaly. Status post CABG. The lungs are mildly hyperexpanded with diffusely increased interstitial markings. No pleural effusion or pneumothorax. No focal airspace consolidation or pulmonary edema. IMPRESSION: No active cardiopulmonary disease. Mild cardiomegaly and findings suggestive of COPD. Electronically Signed   By: Deatra Robinson M.D.   On: 02/27/2019 19:06   Ct Angio Chest Pe W Or Wo Contrast  Result Date: 02/28/2019 CLINICAL DATA:  Shortness of breath EXAM: CT ANGIOGRAPHY CHEST WITH CONTRAST TECHNIQUE: Multidetector CT imaging of the chest was performed using the standard protocol during  bolus administration of intravenous contrast. Multiplanar CT image reconstructions and MIPs were obtained to evaluate the vascular anatomy. CONTRAST:  75mL OMNIPAQUE IOHEXOL 350 MG/ML SOLN COMPARISON:  Chest x-ray from previous day. FINDINGS: Cardiovascular: Thoracic aorta demonstrates atherosclerotic calcifications. No significant opacification of the aorta is seen. Enlargement of the descending aorta is again seen similar to that noted in 2013 related to previous dissection. Maximum diameter is 5.4 cm. No cardiac enlargement is noted. Pulmonary artery is well opacified within normal branching pattern. No definitive filling defects are identified to suggest pulmonary emboli. Changes of prior coronary bypass grafting are noted. Mediastinum/Nodes: Visualized esophagus is within normal limits. No hilar or mediastinal adenopathy is noted. The thoracic inlet is within normal limits. Lungs/Pleura: Small right-sided pleural effusion is  noted. Chronic scarring is noted within both lungs. Upper Abdomen: Visualized upper abdomen demonstrates hypodensity within the right lobe of the liver stable from the prior exam. No other focal upper abdominal abnormality is seen. Musculoskeletal: Degenerative changes of the thoracic spine are noted. Review of the MIP images confirms the above findings. IMPRESSION: No evidence of pulmonary emboli. Changes consistent with the known dissection involving the descending thoracic and upper abdominal aorta. Dilatation at the level of the aortic hiatus is noted slightly increased when compared with the prior exam. The degree of persistent opacification of the dissection is unable to be performed on this exam. Nonemergent CTA of the chest for aortic evaluation is recommended when able. Small pleural effusion on the right. Chronic interstitial scarring in the lungs bilaterally. Aortic Atherosclerosis (ICD10-I70.0). Aortic aneurysm NOS (ICD10-I71.9). Electronically Signed   By: Alcide Clever M.D.    On: 02/28/2019 02:14     Subjective: Patient does not have chest pain, shortness of breath, nausea vomiting, diarrhea, abdominal pain, fever or chills.  He wants to go home.  Discharge Exam: Vitals:   03/01/19 0357 03/01/19 0801  BP: 121/84 116/75  Pulse: 65 62  Resp:  18  Temp: 98.3 F (36.8 C) 98.6 F (37 C)  SpO2: 98% 94%   Vitals:   02/28/19 1722 02/28/19 1948 03/01/19 0357 03/01/19 0801  BP: 137/71 106/63 121/84 116/75  Pulse: (!) 58 63 65 62  Resp:    18  Temp: 98.6 F (37 C) 98.2 F (36.8 C) 98.3 F (36.8 C) 98.6 F (37 C)  TempSrc: Oral Oral Oral Oral  SpO2: 96% 93% 98% 94%  Weight:   81.5 kg   Height:        General: Pt is alert, awake, not in acute distress Cardiovascular: RRR, S1/S2 +, no rubs, no gallops Respiratory: CTA bilaterally, no wheezing, no rhonchi Abdominal: Soft, NT, ND, bowel sounds + Extremities: no edema, no cyanosis    The results of significant diagnostics from this hospitalization (including imaging, microbiology, ancillary and laboratory) are listed below for reference.     Microbiology: Recent Results (from the past 240 hour(s))  SARS CORONAVIRUS 2 (TAT 6-24 HRS) Nasopharyngeal Nasopharyngeal Swab     Status: None   Collection Time: 02/27/19  9:36 PM   Specimen: Nasopharyngeal Swab  Result Value Ref Range Status   SARS Coronavirus 2 NEGATIVE NEGATIVE Final    Comment: (NOTE) SARS-CoV-2 target nucleic acids are NOT DETECTED. The SARS-CoV-2 RNA is generally detectable in upper and lower respiratory specimens during the acute phase of infection. Negative results do not preclude SARS-CoV-2 infection, do not rule out co-infections with other pathogens, and should not be used as the sole basis for treatment or other patient management decisions. Negative results must be combined with clinical observations, patient history, and epidemiological information. The expected result is Negative. Fact Sheet for  Patients: HairSlick.no Fact Sheet for Healthcare Providers: quierodirigir.com This test is not yet approved or cleared by the [place] FDA and  has been authorized for detection and/or diagnosis of SARS-CoV-2 by FDA under an Emergency Use Authorization (EUA). This EUA will remain  in effect (meaning this test can be used) for the duration of the COVID-19 declaration under Section 56 4(b)(1) of the Act, 21 U.S.C. section 360bbb-3(b)(1), unless the authorization is terminated or revoked sooner. Performed at [facility], [address], [place], [place] [address]      Labs: BNP (last 3 results) Recent Labs    02/27/19  1827  BNP 1,962.2*   Basic Metabolic Panel: Recent Labs  Lab 02/27/19 1827 03/01/19 1121  NA 141 138  K 3.9 3.5  CL 110 103  CO2 18* 25  GLUCOSE 127* 151*  BUN 16 20  CREATININE 1.26* 1.53*  CALCIUM 9.5 9.3  MG  --  1.7   Liver Function Tests: No results for input(s): AST, ALT, ALKPHOS, BILITOT, PROT, ALBUMIN in the last 168 hours. No results for input(s): LIPASE, AMYLASE in the last 168 hours. No results for input(s): AMMONIA in the last 168 hours. CBC: Recent Labs  Lab 02/27/19 1827 03/01/19 1121  WBC 6.6 8.2  HGB 14.3 13.2  HCT 43.1 39.6  MCV 90.2 90.2  PLT 106* 122*   Cardiac Enzymes: No results for input(s): CKTOTAL, CKMB, CKMBINDEX, TROPONINI in the last 168 hours. BNP: Invalid input(s): POCBNP CBG: Recent Labs  Lab 02/28/19 1136 02/28/19 1740 02/28/19 2115 03/01/19 0802 03/01/19 1146  GLUCAP 135* 89 251* 131* 155*   D-Dimer No results for input(s): DDIMER in the last 72 hours. Hgb A1c Recent Labs    02/28/19 0012  HGBA1C 6.5*   Lipid Profile No results for input(s): CHOL, HDL, LDLCALC, TRIG, CHOLHDL, LDLDIRECT in the last 72 hours. Thyroid function studies No results for input(s): TSH, T4TOTAL, T3FREE, THYROIDAB in the last 72 hours.  Invalid input(s):  FREET3 Anemia work up No results for input(s): VITAMINB12, FOLATE, FERRITIN, TIBC, IRON, RETICCTPCT in the last 72 hours. Urinalysis    Component Value Date/Time   COLORURINE COLORLESS (A) 02/28/2019 0511   APPEARANCEUR CLEAR (A) 02/28/2019 0511   APPEARANCEUR Clear 06/25/2011 0613   LABSPEC 1.011 02/28/2019 0511   LABSPEC >1.060 06/25/2011 0613   PHURINE 5.0 02/28/2019 0511   GLUCOSEU NEGATIVE 02/28/2019 0511   GLUCOSEU Negative 06/25/2011 0613   HGBUR SMALL (A) 02/28/2019 0511   BILIRUBINUR NEGATIVE 02/28/2019 0511   BILIRUBINUR negative 03/19/2015 1041   BILIRUBINUR Negative 06/25/2011 0613   KETONESUR NEGATIVE 02/28/2019 0511   PROTEINUR NEGATIVE 02/28/2019 0511   UROBILINOGEN 0.2 03/19/2015 1041   NITRITE NEGATIVE 02/28/2019 0511   LEUKOCYTESUR NEGATIVE 02/28/2019 0511   LEUKOCYTESUR Negative 06/25/2011 0613   Sepsis Labs Invalid input(s): PROCALCITONIN,  WBC,  LACTICIDVEN Microbiology Recent Results (from the past 240 hour(s))  SARS CORONAVIRUS 2 (TAT 6-24 HRS) Nasopharyngeal Nasopharyngeal Swab     Status: None   Collection Time: 02/27/19  9:36 PM   Specimen: Nasopharyngeal Swab  Result Value Ref Range Status   SARS Coronavirus 2 NEGATIVE NEGATIVE Final    Comment: (NOTE) SARS-CoV-2 target nucleic acids are NOT DETECTED. The SARS-CoV-2 RNA is generally detectable in upper and lower respiratory specimens during the acute phase of infection. Negative results do not preclude SARS-CoV-2 infection, do not rule out co-infections with other pathogens, and should not be used as the sole basis for treatment or other patient management decisions. Negative results must be combined with clinical observations, patient history, and epidemiological information. The expected result is Negative. Fact Sheet for Patients: SugarRoll.be Fact Sheet for Healthcare Providers: https://www.woods-mathews.com/ This test is not yet approved or cleared  by the [place] FDA and  has been authorized for detection and/or diagnosis of SARS-CoV-2 by FDA under an Emergency Use Authorization (EUA). This EUA will remain  in effect (meaning this test can be used) for the duration of the COVID-19 declaration under Section 56 4(b)(1) of the Act, 21 U.S.C. section 360bbb-3(b)(1), unless the authorization is terminated or revoked sooner. Performed at [facility], [address] [place], [place] [address]     Time coordinating discharge:  35 minutes  SIGNED:  Lorretta Harp, DO [facility] 03/01/2019, 3:16 PM Pager is on AMION  If 7PM-7AM, please contact night-coverage [email or web address] Password [email or web address]

## 2019-03-02 ENCOUNTER — Telehealth: Payer: Self-pay

## 2019-03-02 LAB — ECHOCARDIOGRAM COMPLETE
Height: 69 in
Weight: 2964.8 oz

## 2019-03-02 NOTE — Telephone Encounter (Signed)
Transition Care Management Follow-up Telephone Call  Date of discharge and from where: 03/01/19 [facility]  How have you been since you were released from the hospital? Pt states he is still feeling lousy but doing better. He c/o pain 8/10 when voiding but has improved since being in the hospital.   Any questions or concerns? No   Items Reviewed:  Did the pt receive and understand the discharge instructions provided? Yes   Medications obtained and verified? No  - pt declined med review during call and states he will review at hospital follow up appt   Any new allergies since your discharge? No   Dietary orders reviewed? Yes  Do you have support at home? Yes   Functional Questionnaire: (I = Independent and D = Dependent) ADLs: I  Bathing/Dressing- I  Meal Prep- I  Eating- I  Maintaining continence- I  Transferring/Ambulation- I  Managing Meds- I  Follow up appointments reviewed:   PCP Hospital f/u appt confirmed? Yes  Scheduled to see Dr. Ronnald Ramp on 03/08/19 @ 3:00.  [place] [facility] f/u appt confirmed? Yes  Scheduled to see urology on 03/15/19.  Are transportation arrangements needed? No   If their condition worsens, is the pt aware to call PCP or go to the Emergency Dept.? Yes  Was the patient provided with contact information for the PCP's office or ED? Yes  Was to pt encouraged to call back with questions or concerns? Yes

## 2019-03-02 NOTE — Telephone Encounter (Signed)
Attempted to reach patient for TCM call and schedule hospital follow up appt. Unable to leave message due to voicemail not set up. Patient may reach me directly at [phone].

## 2019-03-04 ENCOUNTER — Telehealth: Payer: Self-pay | Admitting: Physician Assistant

## 2019-03-04 NOTE — Telephone Encounter (Signed)
Pt. States he is leaving town on 03/15/19 and needs to move his VT date up. Please advise.

## 2019-03-08 ENCOUNTER — Ambulatory Visit (INDEPENDENT_AMBULATORY_CARE_PROVIDER_SITE_OTHER): Payer: Medicare Other | Admitting: Urology

## 2019-03-08 ENCOUNTER — Encounter: Payer: Self-pay | Admitting: Urology

## 2019-03-08 ENCOUNTER — Inpatient Hospital Stay: Payer: Medicare Other | Admitting: Family Medicine

## 2019-03-08 ENCOUNTER — Other Ambulatory Visit: Payer: Self-pay

## 2019-03-08 VITALS — BP 136/80 | HR 72 | Ht 71.0 in | Wt 194.0 lb

## 2019-03-08 DIAGNOSIS — R310 Gross hematuria: Secondary | ICD-10-CM | POA: Diagnosis not present

## 2019-03-08 DIAGNOSIS — R339 Retention of urine, unspecified: Secondary | ICD-10-CM

## 2019-03-08 NOTE — Progress Notes (Signed)
   03/08/2019 2:58 PM   Almeta Monas [birth year] [id number]  Reason for visit: Catheter problems  HPI: Mr. Delcid was sent to our clinic here in [facility] today as an add-on for catheter problems after being seen by his primary here today.  He is an 85 year old comorbid male with history of gross hematuria and elevated PSA of 7 that never followed up for work-up with cystoscopy or biopsy in 2017.  He was recently seen by our PA Debroah Loop on 02/28/2019 when he was admitted to the hospital with chest pain and was found to have some penile pain and an elevated bladder scan of 600 mL with inability to urinate.  His urinalysis at that admission was benign.  Apparently he was straight cathed one time, and then when nursing tried to place a 16 [place] Foley they met resistance and he had some penile bleeding.  Sam ultimately placed an 68 French coud Foley with some thin cherry red urine without clots and put 30 cc in the balloon. On reviewing his old CT, he has a 90 g prostate with large intravesical protrusion.  He denies any fevers or chills.  The patient reports over the last few days he has had occasional spasms of abdominal pain with leakage of urine around the catheter.  On exam, there is some coke colored urine in the Foley bag, but no clots.  Bladder scan is 0 mL.    I irrigated the catheter with 500 mL of normal saline with return of no clots.  Urine rapidly cleared.  He did have some spasms around the catheter which I suspect are the source of his intermittent pain and leakage.  I took 20 cc out of the balloon to leave 10 cc and hopefully improve the spasms he was having.  We talked about options including removal of the catheter with a fill and pull voiding trial today and returning for a bladder scan tomorrow morning, versus maintaining a Foley and keeping his scheduled follow-up on 11/23 with Sam for voiding trial.  He would like to maintain the Foley and keep follow-up on 11/23 for  voiding trial.  Myrbetriq 50 mg daily for bladder spasms, samples given today Keep scheduled follow-up 11/23 for voiding trial  A total of 25 minutes were spent face-to-face with the patient, greater than 50% was spent in patient education, counseling, and coordination of care regarding catheter troubleshooting.  B[place]ley [place], [facility] [address] [place], [place] [address] [phone]

## 2019-03-11 DIAGNOSIS — E1159 Type 2 diabetes mellitus with other circulatory complications: Secondary | ICD-10-CM | POA: Diagnosis not present

## 2019-03-11 DIAGNOSIS — E1169 Type 2 diabetes mellitus with other specified complication: Secondary | ICD-10-CM | POA: Diagnosis not present

## 2019-03-11 DIAGNOSIS — R809 Proteinuria, unspecified: Secondary | ICD-10-CM | POA: Diagnosis not present

## 2019-03-11 DIAGNOSIS — E1129 Type 2 diabetes mellitus with other diabetic kidney complication: Secondary | ICD-10-CM | POA: Diagnosis not present

## 2019-03-11 DIAGNOSIS — E785 Hyperlipidemia, unspecified: Secondary | ICD-10-CM | POA: Diagnosis not present

## 2019-03-14 ENCOUNTER — Other Ambulatory Visit: Payer: Self-pay

## 2019-03-14 ENCOUNTER — Ambulatory Visit: Payer: Medicare Other | Admitting: Physician Assistant

## 2019-03-14 VITALS — BP 130/97 | HR 63 | Ht 70.0 in | Wt 184.0 lb

## 2019-03-14 DIAGNOSIS — R339 Retention of urine, unspecified: Secondary | ICD-10-CM

## 2019-03-14 LAB — BLADDER SCAN AMB NON-IMAGING: Scan Result: 135 mL

## 2019-03-14 NOTE — Progress Notes (Addendum)
Fill and Pull Catheter Removal  Patient is present today for a catheter removal.  Patient was cleaned and prepped in a sterile fashion 261ml of sterile water was instilled into the bladder when the patient felt the urge to urinate. 40ml of water was then drained from the balloon.  A 18FR coude foley cath was removed from the bladder no complications were noted .  Patient as then given some time to void on their own.  Patient can void  132ml on their own after some time.  Patient tolerated well.  Performed by: Debroah Loop, PA-C   Follow up/ Additional notes: Return this afternoon for PVR.  Afternoon follow-up   Results for orders placed or performed in visit on 03/14/19  Bladder Scan (Post Void Residual) in office  Result Value Ref Range   Scan Result 135 mL   Patient returns this afternoon with reports that he drank several beverages at home and was able to urinate.  He describes a strong stream and denies dribbling, intermittency, or weak stream.  He has no acute concerns at this time.  He states he continues to take Flomax.  PVR WNL; no need to replace catheter today. Counseled patient to continue Flomax. I counseled the patient on signs and symptoms of urinary retention, including lower abdominal pain, lumbar pain, abdominal distention, and the inability to urinate.  I advised him to contact the office for assistance if he develops these symptoms during routine office hours, 8 AM to 5 PM Monday through Friday.  If outside those hours, I advised him to proceed to the emergency department. He expressed understanding.   He will require further follow-up in clinic to discuss his history of hematuria and nodular prostate, with recommendations from 2016 to undergo prostate biopsy, cystoscopy, and CT urogram still outstanding. He denies gross hematuria aside from what was noted to be associated with traumatic Foley placement during his recent hospitalization. We will schedule this and notify  him via letter, per patient preference.  Return in about 4 weeks (around 04/11/2019) for f/u PMH gross hematuria and nodular prostate.  I spent 15 min with this patient, of which greater than 50% was spent in counseling and coordination of care with the patient.   Debroah Loop, PA-C  03/14/19 1:41 PM

## 2019-03-15 ENCOUNTER — Ambulatory Visit: Payer: Medicare Other | Admitting: Physician Assistant

## 2019-03-16 ENCOUNTER — Telehealth: Payer: Self-pay | Admitting: Physician Assistant

## 2019-03-16 NOTE — Telephone Encounter (Signed)
Spoke to patient and he states it hurts to urinate. He states it doesn't burn but hurts and not much comes out. I informed him that he may be going back into retention and he will need to come in to have a bladder scan. Patient informed me he is in [place]. He asked me to speak  to his daughter and she states she does not think he needs to go to a Urgent Care or Hospital at this time. I strongly encouraged her and informed her of the possible Kidney problems if he is in retention and lets it go. She states she will keep an eye on him and wanted an Appointment for Monday. I made an appointment in [place] for patient and told her if he did have to have a catheter placed over the weekend he will need to keep it in for a week before it can be removed. Patient's daughter voiced understanding.

## 2019-03-16 NOTE — Telephone Encounter (Signed)
Pt's daughter [facility] and states that pt would like something called in for UA relief. She requests a call back  [phone]

## 2019-03-17 DIAGNOSIS — B9689 Other specified bacterial agents as the cause of diseases classified elsewhere: Secondary | ICD-10-CM | POA: Diagnosis not present

## 2019-03-17 DIAGNOSIS — R000 Tachycardia, unspecified: Secondary | ICD-10-CM | POA: Diagnosis not present

## 2019-03-17 DIAGNOSIS — R319 Hematuria, unspecified: Secondary | ICD-10-CM | POA: Diagnosis not present

## 2019-03-17 DIAGNOSIS — R339 Retention of urine, unspecified: Secondary | ICD-10-CM | POA: Diagnosis not present

## 2019-03-17 DIAGNOSIS — N390 Urinary tract infection, site not specified: Secondary | ICD-10-CM | POA: Diagnosis not present

## 2019-03-20 NOTE — Progress Notes (Signed)
03/21/2019 11:26 AM   Kenneth Mitchell 06/10/1933 [id number]  Referring provider: Duanne LimerickJones, Deanna C, MD [address] [place],  [place] [id number]  Chief Complaint  Patient presents with  . Follow-up    HPI: Kenneth Mitchell is an 85 year old male with a history of elevated PSA, nodular prostate, history of hematuria and BPH with urinary retention who presents today with complaints of inability to urinate.  He had his Foley catheter removed on March 14, 2019 for voiding trial.  He then started to experience difficulty in urination and then over the weekend went into urinary retention.  He was in [place] with family for the holidays and so he was seen [facility] ED where a Foley catheter was placed.  Urine cultures were positive for serratia marcescens.  He was given Rocephin in the ED and discharged on Keflex.  Today, he is complaining that his leg bag is leaking.  He is still having bladder spasms.   He states the Myrbetriq samples given to him only worked for three days.  Patient denies any gross hematuria or suprapubic/flank pain.  Patient denies any fevers, chills, nausea or vomiting.   PMH: Past Medical History:  Diagnosis Date  . CAD (coronary artery disease)   . Diabetes mellitus (HCC)   . Gross hematuria   . Hyperlipidemia   . Hypertension   . Inguinal hernia   . Nodular prostate   . Syncope     Surgical History: Past Surgical History:  Procedure Laterality Date  . aneurysm surgery     x 2  . LEFT HEART CATH AND CORS/GRAFTS ANGIOGRAPHY N/A 02/28/2019   Procedure: LEFT HEART CATH AND CORS/GRAFTS ANGIOGRAPHY possible PCI and stent;  Surgeon: Alwyn Peaallwood, Dwayne D, MD;  Location: [facility] INVASIVE CV LAB;  Service: Cardiovascular;  Laterality: N/A;  . quadrupal bypass      Home Medications:  Allergies as of 03/21/2019   No Known Allergies     Medication List       Accurate as of March 21, 2019 11:26 AM. If you have any questions, ask  your nurse or doctor.        amLODipine 10 MG tablet Commonly known as: NORVASC Take 1 tablet (10 mg total) by mouth daily at 6 (six) AM.   aspirin 325 MG EC tablet Take 1 tablet (325 mg total) by mouth daily.   Aspirin Buf(CaCarb-MgCarb-MgO) 81 MG Tabs Take by mouth.   carvedilol 6.25 MG tablet Commonly known as: COREG Take 1 tablet (6.25 mg total) by mouth 2 (two) times daily with a meal.   furosemide 40 MG tablet Commonly known as: LASIX Take 1 tablet (40 mg total) by mouth daily.   glipiZIDE 5 MG tablet Commonly known as: GLUCOTROL TAKE 1 TABLET BY MOUTH TWICE DAILY BEFORE A MEAL   hydrOXYzine 10 MG tablet Commonly known as: ATARAX/VISTARIL Take 1 tablet (10 mg total) by mouth 3 (three) times daily as needed.   isosorbide mononitrate 60 MG 24 hr tablet Commonly known as: IMDUR Take 1 tablet (60 mg total) by mouth daily at 6 (six) AM.   lisinopril 5 MG tablet Commonly known as: ZESTRIL Take 1 tablet (5 mg total) by mouth daily.   metFORMIN 500 MG tablet Commonly known as: GLUCOPHAGE Take 1 tablet (500 mg total) by mouth 2 (two) times daily with a meal.   nitroGLYCERIN 0.2 mg/hr patch Commonly known as: NITRODUR - Dosed in mg/24 hr UNWRAP AND APPLY 1  PATCH ONCE DAILY, LEAVE ON FOR 12-14 HOURS(WAIT 10-12 HOURS BEFORE NEXT PATCH)   ONETOUCH ULTRA BLUE VI Use 2 (two) times daily DX: E11.29   pantoprazole 40 MG tablet Commonly known as: PROTONIX Take 1 tablet (40 mg total) by mouth daily.   ranolazine 1000 MG SR tablet Commonly known as: RANEXA Take 1 tablet (1,000 mg total) by mouth 2 (two) times daily.   rosuvastatin 10 MG tablet Commonly known as: CRESTOR Take 1 tablet (10 mg total) by mouth daily.   sildenafil 20 MG tablet Commonly known as: REVATIO 3-5 tabs every 36 hours as necessary   spironolactone 25 MG tablet Commonly known as: ALDACTONE Take 1 tablet (25 mg total) by mouth daily.       Allergies: No Known Allergies  Family History:  Family History  Problem Relation Age of Onset  . Diabetes Mother   . Heart disease Father   . Stroke Father   . Cancer Brother   . Kidney disease Neg Hx   . Prostate cancer Neg Hx     Social History:  reports that he has quit smoking. He has never used smokeless tobacco. He reports that he does not drink alcohol or use drugs.  ROS: UROLOGY Frequent Urination?: No Hard to postpone urination?: No Burning/pain with urination?: No Get up at night to urinate?: No Leakage of urine?: No Urine stream starts and stops?: No Trouble starting stream?: No Do you have to strain to urinate?: No Blood in urine?: No Urinary tract infection?: No Sexually transmitted disease?: No Injury to kidneys or bladder?: No Painful intercourse?: No Weak stream?: No Erection problems?: No Penile pain?: No  Gastrointestinal Nausea?: No Vomiting?: No Indigestion/heartburn?: No Diarrhea?: No Constipation?: No  Constitutional Fever: No Night sweats?: No Weight loss?: No Fatigue?: No  Skin Skin rash/lesions?: No Itching?: No  Eyes Blurred vision?: No Double vision?: No  Ears/Nose/Throat Sore throat?: No Sinus problems?: No  Hematologic/Lymphatic Swollen glands?: No Easy bruising?: No  Cardiovascular Leg swelling?: No Chest pain?: No  Respiratory Cough?: No Shortness of breath?: No  Endocrine Excessive thirst?: No  Musculoskeletal Back pain?: No Joint pain?: No  Neurological Headaches?: No Dizziness?: No  Psychologic Depression?: No Anxiety?: No  Physical Exam: BP (!) 134/97   Pulse 77   Ht 5\' 10"  (1.778 m)   Wt 184 lb (83.5 kg)   BMI 26.40 kg/m   Constitutional:  Well nourished. Alert and oriented, No acute distress. HEENT: [place] AT, moist mucus membranes.  Trachea midline, no masses. Cardiovascular: No clubbing, cyanosis, or edema. Respiratory: Normal respiratory effort, no increased work of breathing. GI: Abdomen is soft, non tender, non distended, no  abdominal masses. Liver and spleen not palpable.  No hernias appreciated.  Stool sample for occult testing is not indicated.   GU: No CVA tenderness.  No bladder fullness or masses.  Patient with uncircumcised phallus.  Foreskin easily retracted.  14 French Foley in place draining orange urine consistent with Pyridium use.  Scrotum without lesions, cysts, rashes and/or edema.   Skin: No rashes, bruises or suspicious lesions. Neurologic: Grossly intact, no focal deficits, moving all 4 extremities. Psychiatric: Normal mood and affect.  Laboratory Data: Lab Results  Component Value Date   WBC 8.2 03/01/2019   HGB 13.2 03/01/2019   HCT 39.6 03/01/2019   MCV 90.2 03/01/2019   PLT 122 (L) 03/01/2019    Lab Results  Component Value Date   CREATININE 1.53 (H) 03/01/2019    No results found for: PSA  No results found for: TESTOSTERONE  Lab Results  Component Value Date   HGBA1C 6.5 (H) 02/28/2019    No results found for: TSH     Component Value Date/Time   CHOL 155 11/29/2014 0857   CHOL 131 06/25/2011 0633   HDL 27 (L) 11/29/2014 0857   HDL 29 (L) 06/25/2011 0633   CHOLHDL 5.7 (H) 11/29/2014 0857   VLDL 14 06/25/2011 0633   LDLCALC 74 11/29/2014 0857   LDLCALC 88 06/25/2011 0633    Lab Results  Component Value Date   AST 34 10/05/2017   Lab Results  Component Value Date   ALT 16 (L) 10/05/2017   No components found for: ALKALINEPHOPHATASE No components found for: BILIRUBINTOTAL  No results found for: ESTRADIOL  Urinalysis    Component Value Date/Time   COLORURINE COLORLESS (A) 02/28/2019 0511   APPEARANCEUR CLEAR (A) 02/28/2019 0511   APPEARANCEUR Clear 06/25/2011 0613   LABSPEC 1.011 02/28/2019 0511   LABSPEC >1.060 06/25/2011 0613   PHURINE 5.0 02/28/2019 0511   GLUCOSEU NEGATIVE 02/28/2019 0511   GLUCOSEU Negative 06/25/2011 0613   HGBUR SMALL (A) 02/28/2019 0511   BILIRUBINUR NEGATIVE 02/28/2019 0511   BILIRUBINUR negative 03/19/2015 1041    BILIRUBINUR Negative 06/25/2011 [place] 02/28/2019 0511   PROTEINUR NEGATIVE 02/28/2019 0511   UROBILINOGEN 0.2 03/19/2015 1041   NITRITE NEGATIVE 02/28/2019 0511   LEUKOCYTESUR NEGATIVE 02/28/2019 0511   LEUKOCYTESUR Negative 06/25/2011 0613    I have reviewed the labs.   Pertinent Imaging: No recent imaging     Assessment & Plan:    1. Urinary retention We discussed returning for voiding trial and if he should fail that as he has failed 2 prior learning how to self catheterize.  He states he is not interested in self-catheterization and would just keep the Foley in place for now.   RTC in one month for catheter exchange  2. Nodular prostate Was found to have elevated PSA of 7 and recommended to undergo prostate biopsy in 2017 but did not His situation is now complicated by multiple comorbidities and recent MI Patient has an appointment with Sam in the future to discuss how he would like to proceed  3. History of hematuria No hematuria on today's visit Will continue to monitor  See above   Return in about 1 month (around 04/20/2019) for Foley catheter exchange .  These notes generated with voice recognition software. I apologize for typographical errors.  Zara Council, PA-C  [facility] [address]  [place], [place] [address] [phone]

## 2019-03-21 ENCOUNTER — Ambulatory Visit: Payer: Medicare Other | Admitting: Urology

## 2019-03-21 ENCOUNTER — Other Ambulatory Visit: Payer: Self-pay

## 2019-03-21 ENCOUNTER — Encounter: Payer: Self-pay | Admitting: Urology

## 2019-03-21 VITALS — BP 134/97 | HR 77 | Ht 70.0 in | Wt 184.0 lb

## 2019-03-21 DIAGNOSIS — N402 Nodular prostate without lower urinary tract symptoms: Secondary | ICD-10-CM | POA: Diagnosis not present

## 2019-03-21 DIAGNOSIS — R339 Retention of urine, unspecified: Secondary | ICD-10-CM | POA: Diagnosis not present

## 2019-03-21 DIAGNOSIS — Z87448 Personal history of other diseases of urinary system: Secondary | ICD-10-CM | POA: Diagnosis not present

## 2019-03-24 ENCOUNTER — Telehealth: Payer: Self-pay | Admitting: Urology

## 2019-03-24 NOTE — Telephone Encounter (Signed)
Per Larene Beach patient needs to stop Toviaz and take Miralax as needed for constipation. Patient notified and voiced understanding.

## 2019-03-24 NOTE — Telephone Encounter (Signed)
Pt saw Larene Beach on Monday in [place].  He said samples of Tovaiz are constipating him.  He says it hurts when he pees and when he has a bowel movement.  He says he can't even walk it hurts so bad.

## 2019-03-25 ENCOUNTER — Encounter: Payer: Self-pay | Admitting: Urology

## 2019-03-25 ENCOUNTER — Ambulatory Visit (INDEPENDENT_AMBULATORY_CARE_PROVIDER_SITE_OTHER): Payer: Medicare Other | Admitting: Urology

## 2019-03-25 ENCOUNTER — Other Ambulatory Visit: Payer: Self-pay

## 2019-03-25 ENCOUNTER — Telehealth: Payer: Self-pay | Admitting: Urology

## 2019-03-25 VITALS — BP 130/84 | HR 74 | Ht 67.0 in | Wt 184.0 lb

## 2019-03-25 DIAGNOSIS — R339 Retention of urine, unspecified: Secondary | ICD-10-CM

## 2019-03-25 NOTE — Telephone Encounter (Signed)
Patient is coming in to be seen.

## 2019-03-25 NOTE — Progress Notes (Signed)
03/25/2019 11:50 AM   Kenneth Mitchell January 03, 1934 [id number]  Referring provider: Juline Patch, MD [address] [place],  [place] [address]  Chief Complaint  Patient presents with  . Follow-up    HPI: Kenneth Mitchell is an 85 year old male with a history of elevated PSA, nodular prostate, history of hematuria and BPH with urinary retention who presents today with complaints of inability to urinate.  He had his Foley catheter removed on March 14, 2019 for voiding trial.  He then started to experience difficulty in urination and then over the weekend went into urinary retention.  He was in [place] with family for the holidays and so he was seen at [facility] ED where a Foley catheter was placed.  Urine cultures were positive for serratia marcescens.  He was given Rocephin in the ED and discharged on Keflex.  At his appointment on March 21, 2019, he decided just to keep the Foley catheter in long-term.  Today, he states that he is having pain with urination.  He states he is trying to have bowel movements and urinate in tandem.  He denied any gross hematuria.  He states he is having to empty his leg bag 2-3 times a day.  He is taking MiraLAX daily and is starting to have softer bowel movements.  Patient denies any gross hematuria, dysuria or suprapubic/flank pain.  Patient denies any fevers, chills, nausea or vomiting.    PMH: Past Medical History:  Diagnosis Date  . CAD (coronary artery disease)   . Diabetes mellitus ([place])   . Gross hematuria   . Hyperlipidemia   . Hypertension   . Inguinal hernia   . Nodular prostate   . Syncope     Surgical History: Past Surgical History:  Procedure Laterality Date  . aneurysm surgery     x 2  . LEFT HEART CATH AND CORS/GRAFTS ANGIOGRAPHY N/A 02/28/2019   Procedure: LEFT HEART CATH AND CORS/GRAFTS ANGIOGRAPHY possible PCI and stent;  Surgeon: Yolonda Kida, MD;  Location: [facility] CV LAB;   Service: Cardiovascular;  Laterality: N/A;  . quadrupal bypass      Home Medications:  Allergies as of 03/25/2019   No Known Allergies     Medication List       Accurate as of March 25, 2019 11:50 AM. If you have any questions, ask your nurse or doctor.        amLODipine 10 MG tablet Commonly known as: NORVASC Take 1 tablet (10 mg total) by mouth daily at 6 (six) AM.   aspirin 325 MG EC tablet Take 1 tablet (325 mg total) by mouth daily.   Aspirin Buf(CaCarb-MgCarb-MgO) 81 MG Tabs Take by mouth.   carvedilol 6.25 MG tablet Commonly known as: COREG Take 1 tablet (6.25 mg total) by mouth 2 (two) times daily with a meal.   furosemide 40 MG tablet Commonly known as: LASIX Take 1 tablet (40 mg total) by mouth daily.   glipiZIDE 5 MG tablet Commonly known as: GLUCOTROL TAKE 1 TABLET BY MOUTH TWICE DAILY BEFORE A MEAL   hydrOXYzine 10 MG tablet Commonly known as: ATARAX/VISTARIL Take 1 tablet (10 mg total) by mouth 3 (three) times daily as needed.   isosorbide mononitrate 60 MG 24 hr tablet Commonly known as: IMDUR Take 1 tablet (60 mg total) by mouth daily at 6 (six) AM.   lisinopril 5 MG tablet Commonly known as: ZESTRIL Take 1 tablet (5 mg total)  by mouth daily.   metFORMIN 500 MG tablet Commonly known as: GLUCOPHAGE Take 1 tablet (500 mg total) by mouth 2 (two) times daily with a meal.   nitroGLYCERIN 0.2 mg/hr patch Commonly known as: NITRODUR - Dosed in mg/24 hr UNWRAP AND APPLY 1 PATCH ONCE DAILY, LEAVE ON FOR 12-14 HOURS(WAIT 10-12 HOURS BEFORE NEXT PATCH)   ONETOUCH ULTRA BLUE VI Use 2 (two) times daily DX: E11.29   pantoprazole 40 MG tablet Commonly known as: PROTONIX Take 1 tablet (40 mg total) by mouth daily.   ranolazine 1000 MG SR tablet Commonly known as: RANEXA Take 1 tablet (1,000 mg total) by mouth 2 (two) times daily.   rosuvastatin 10 MG tablet Commonly known as: CRESTOR Take 1 tablet (10 mg total) by mouth daily.   sildenafil 20  MG tablet Commonly known as: REVATIO 3-5 tabs every 36 hours as necessary   spironolactone 25 MG tablet Commonly known as: ALDACTONE Take 1 tablet (25 mg total) by mouth daily.       Allergies: No Known Allergies  Family History: Family History  Problem Relation Age of Onset  . Diabetes Mother   . Heart disease Father   . Stroke Father   . Cancer Brother   . Kidney disease Neg Hx   . Prostate cancer Neg Hx     Social History:  reports that he has quit smoking. He has never used smokeless tobacco. He reports that he does not drink alcohol or use drugs.  ROS: UROLOGY Frequent Urination?: No Hard to postpone urination?: No Burning/pain with urination?: No Get up at night to urinate?: No Leakage of urine?: No Urine stream starts and stops?: No Trouble starting stream?: No Do you have to strain to urinate?: No Blood in urine?: No Urinary tract infection?: No Sexually transmitted disease?: No Injury to kidneys or bladder?: No Painful intercourse?: No Weak stream?: No Erection problems?: No Penile pain?: No  Gastrointestinal Nausea?: No Vomiting?: No Indigestion/heartburn?: No Diarrhea?: No Constipation?: No  Constitutional Fever: No Night sweats?: No Weight loss?: No Fatigue?: No  Skin Skin rash/lesions?: No Itching?: No  Eyes Blurred vision?: No Double vision?: No  Ears/Nose/Throat Sore throat?: No Sinus problems?: No  Hematologic/Lymphatic Swollen glands?: No Easy bruising?: No  Cardiovascular Leg swelling?: No Chest pain?: No  Respiratory Cough?: No Shortness of breath?: No  Endocrine Excessive thirst?: No  Musculoskeletal Back pain?: No Joint pain?: No  Neurological Headaches?: No Dizziness?: No  Psychologic Depression?: No Anxiety?: No  Physical Exam: BP 130/84   Pulse 74   Ht 5\' 7"  (1.702 m)   Wt 184 lb (83.5 kg)   BMI 28.82 kg/m   Constitutional:  Well nourished. Alert.  No acute distress. HEENT: [place] AT, moist  mucus membranes.  Trachea midline, no masses. Cardiovascular: No clubbing, cyanosis, or edema. Respiratory: Normal respiratory effort, no increased work of breathing. GI: Abdomen is soft, non tender, non distended, no abdominal masses. Liver and spleen not palpable.  No hernias appreciated.  Stool sample for occult testing is not indicated.   GU: No CVA tenderness.  No bladder fullness or masses.  Patient with uncircumcised phallus.  Foreskin easily retracted.  Foley found on tension.  His leg bag was almost to his ankle and his leg strap to secure the catheter was found above his knee and the Foley was tight.  Neurologic: Grossly intact, no focal deficits, moving all 4 extremities. Psychiatric: Normal mood and affect.  Laboratory Data: Lab Results  Component Value Date  WBC 8.2 03/01/2019   HGB 13.2 03/01/2019   HCT 39.6 03/01/2019   MCV 90.2 03/01/2019   PLT 122 (L) 03/01/2019    Lab Results  Component Value Date   CREATININE 1.53 (H) 03/01/2019    No results found for: PSA  No results found for: TESTOSTERONE  Lab Results  Component Value Date   HGBA1C 6.5 (H) 02/28/2019    No results found for: TSH     Component Value Date/Time   CHOL 155 11/29/2014 0857   CHOL 131 06/25/2011 0633   HDL 27 (L) 11/29/2014 0857   HDL 29 (L) 06/25/2011 0633   CHOLHDL 5.7 (H) 11/29/2014 0857   VLDL 14 06/25/2011 0633   LDLCALC 74 11/29/2014 0857   LDLCALC 88 06/25/2011 0633    Lab Results  Component Value Date   AST 34 10/05/2017   Lab Results  Component Value Date   ALT 16 (L) 10/05/2017   No components found for: ALKALINEPHOPHATASE No components found for: BILIRUBINTOTAL  No results found for: ESTRADIOL  Urinalysis    Component Value Date/Time   COLORURINE COLORLESS (A) 02/28/2019 0511   APPEARANCEUR CLEAR (A) 02/28/2019 0511   APPEARANCEUR Clear 06/25/2011 0613   LABSPEC 1.011 02/28/2019 0511   LABSPEC >1.060 06/25/2011 0613   PHURINE 5.0 02/28/2019 0511    GLUCOSEU NEGATIVE 02/28/2019 0511   GLUCOSEU Negative 06/25/2011 0613   HGBUR SMALL (A) 02/28/2019 0511   BILIRUBINUR NEGATIVE 02/28/2019 0511   BILIRUBINUR negative 03/19/2015 1041   BILIRUBINUR Negative 06/25/2011 0613   KETONESUR NEGATIVE 02/28/2019 0511   PROTEINUR NEGATIVE 02/28/2019 0511   UROBILINOGEN 0.2 03/19/2015 1041   NITRITE NEGATIVE 02/28/2019 0511   LEUKOCYTESUR NEGATIVE 02/28/2019 0511   LEUKOCYTESUR Negative 06/25/2011 0613    I have reviewed the labs.   Pertinent Imaging: No recent imaging     Assessment & Plan:    1. Urinary retention I explained to Mr. Corrie Mckusickodell that the reason why he is having discomfort and bladder spasm is likely to the fact that the Foley catheter balloon is being pressed up against his prostate and trigone of his bladder.  I readjusted his leg bag his leg strap to allow to have slack in the Foley.  I emphasized that he needs to keep the catheter secure strap high in his thigh and the leg bag secured to his thigh to keep slack in the catheter. RTC in one month for catheter exchange  2. Nodular prostate Was found to have elevated PSA of 7 and recommended to undergo prostate biopsy in 2017 but did not His situation is now complicated by multiple comorbidities and recent MI Patient has an appointment with Sam in the future to discuss how he would like to proceed  3. History of hematuria No hematuria on today's visit Will continue to monitor  See above   Return for keep follow up .  These notes generated with voice recognition software. I apologize for typographical errors.  Michiel CowboySHANNON Kaela Beitz, PA-C  [facility] [address]  [address] [place], [place] [address] [phone]

## 2019-03-25 NOTE — Telephone Encounter (Signed)
Pt called office and states he is in a lot of pain when he urinates.  He has appt w/Sam 12/28.

## 2019-03-28 ENCOUNTER — Telehealth: Payer: Self-pay | Admitting: Family Medicine

## 2019-03-28 ENCOUNTER — Emergency Department: Payer: Medicare Other

## 2019-03-28 ENCOUNTER — Other Ambulatory Visit: Payer: Self-pay

## 2019-03-28 ENCOUNTER — Encounter: Payer: Self-pay | Admitting: Medical Oncology

## 2019-03-28 ENCOUNTER — Encounter: Payer: Self-pay | Admitting: Family Medicine

## 2019-03-28 ENCOUNTER — Inpatient Hospital Stay
Admission: EM | Admit: 2019-03-28 | Discharge: 2019-03-30 | DRG: 291 | Disposition: A | Discharge status: Home Health | Payer: Medicare Other | Attending: Internal Medicine | Admitting: Internal Medicine

## 2019-03-28 ENCOUNTER — Ambulatory Visit (INDEPENDENT_AMBULATORY_CARE_PROVIDER_SITE_OTHER): Payer: Medicare Other | Admitting: Family Medicine

## 2019-03-28 VITALS — BP 120/80 | HR 66 | Ht 70.0 in | Wt 193.0 lb

## 2019-03-28 DIAGNOSIS — Z7982 Long term (current) use of aspirin: Secondary | ICD-10-CM

## 2019-03-28 DIAGNOSIS — Z8679 Personal history of other diseases of the circulatory system: Secondary | ICD-10-CM

## 2019-03-28 DIAGNOSIS — I159 Secondary hypertension, unspecified: Secondary | ICD-10-CM | POA: Diagnosis not present

## 2019-03-28 DIAGNOSIS — I5021 Acute systolic (congestive) heart failure: Secondary | ICD-10-CM | POA: Diagnosis not present

## 2019-03-28 DIAGNOSIS — R635 Abnormal weight gain: Secondary | ICD-10-CM | POA: Diagnosis not present

## 2019-03-28 DIAGNOSIS — I509 Heart failure, unspecified: Secondary | ICD-10-CM | POA: Diagnosis not present

## 2019-03-28 DIAGNOSIS — R0600 Dyspnea, unspecified: Secondary | ICD-10-CM | POA: Diagnosis not present

## 2019-03-28 DIAGNOSIS — R0601 Orthopnea: Secondary | ICD-10-CM

## 2019-03-28 DIAGNOSIS — Z79899 Other long term (current) drug therapy: Secondary | ICD-10-CM

## 2019-03-28 DIAGNOSIS — Z823 Family history of stroke: Secondary | ICD-10-CM

## 2019-03-28 DIAGNOSIS — I2510 Atherosclerotic heart disease of native coronary artery without angina pectoris: Secondary | ICD-10-CM | POA: Diagnosis present

## 2019-03-28 DIAGNOSIS — Z9114 Patient's other noncompliance with medication regimen: Secondary | ICD-10-CM | POA: Diagnosis not present

## 2019-03-28 DIAGNOSIS — N1832 Chronic kidney disease, stage 3b: Secondary | ICD-10-CM | POA: Diagnosis not present

## 2019-03-28 DIAGNOSIS — Z8249 Family history of ischemic heart disease and other diseases of the circulatory system: Secondary | ICD-10-CM | POA: Diagnosis not present

## 2019-03-28 DIAGNOSIS — I248 Other forms of acute ischemic heart disease: Secondary | ICD-10-CM | POA: Diagnosis not present

## 2019-03-28 DIAGNOSIS — R778 Other specified abnormalities of plasma proteins: Secondary | ICD-10-CM | POA: Diagnosis not present

## 2019-03-28 DIAGNOSIS — I130 Hypertensive heart and chronic kidney disease with heart failure and stage 1 through stage 4 chronic kidney disease, or unspecified chronic kidney disease: Principal | ICD-10-CM | POA: Diagnosis present

## 2019-03-28 DIAGNOSIS — E1122 Type 2 diabetes mellitus with diabetic chronic kidney disease: Secondary | ICD-10-CM | POA: Diagnosis not present

## 2019-03-28 DIAGNOSIS — E785 Hyperlipidemia, unspecified: Secondary | ICD-10-CM | POA: Diagnosis not present

## 2019-03-28 DIAGNOSIS — I5023 Acute on chronic systolic (congestive) heart failure: Secondary | ICD-10-CM | POA: Diagnosis not present

## 2019-03-28 DIAGNOSIS — E1129 Type 2 diabetes mellitus with other diabetic kidney complication: Secondary | ICD-10-CM | POA: Diagnosis present

## 2019-03-28 DIAGNOSIS — R7989 Other specified abnormal findings of blood chemistry: Secondary | ICD-10-CM | POA: Diagnosis present

## 2019-03-28 DIAGNOSIS — Z66 Do not resuscitate: Secondary | ICD-10-CM | POA: Diagnosis not present

## 2019-03-28 DIAGNOSIS — Z951 Presence of aortocoronary bypass graft: Secondary | ICD-10-CM

## 2019-03-28 DIAGNOSIS — I252 Old myocardial infarction: Secondary | ICD-10-CM

## 2019-03-28 DIAGNOSIS — I10 Essential (primary) hypertension: Secondary | ICD-10-CM | POA: Diagnosis not present

## 2019-03-28 DIAGNOSIS — Z20828 Contact with and (suspected) exposure to other viral communicable diseases: Secondary | ICD-10-CM | POA: Diagnosis present

## 2019-03-28 DIAGNOSIS — Z9119 Patient's noncompliance with other medical treatment and regimen: Secondary | ICD-10-CM

## 2019-03-28 DIAGNOSIS — N183 Chronic kidney disease, stage 3 unspecified: Secondary | ICD-10-CM

## 2019-03-28 DIAGNOSIS — R0609 Other forms of dyspnea: Secondary | ICD-10-CM

## 2019-03-28 DIAGNOSIS — Z91199 Patient's noncompliance with other medical treatment and regimen due to unspecified reason: Secondary | ICD-10-CM

## 2019-03-28 DIAGNOSIS — R338 Other retention of urine: Secondary | ICD-10-CM | POA: Diagnosis not present

## 2019-03-28 DIAGNOSIS — R079 Chest pain, unspecified: Secondary | ICD-10-CM | POA: Diagnosis not present

## 2019-03-28 DIAGNOSIS — I110 Hypertensive heart disease with heart failure: Secondary | ICD-10-CM | POA: Diagnosis not present

## 2019-03-28 DIAGNOSIS — Z833 Family history of diabetes mellitus: Secondary | ICD-10-CM | POA: Diagnosis not present

## 2019-03-28 LAB — TROPONIN I (HIGH SENSITIVITY)
Troponin I (High Sensitivity): 6091 ng/L (ref <18)
Troponin I (High Sensitivity): 5353 ng/L (ref <18)

## 2019-03-28 LAB — BASIC METABOLIC PANEL
Anion gap: 13 (ref 5–15)
BUN: 20 mg/dL (ref 8–23)
CO2: 18 mmol/L — ABNORMAL LOW (ref 22–32)
Calcium: 9.8 mg/dL (ref 8.9–10.3)
Chloride: 108 mmol/L (ref 98–111)
Creatinine, Ser: 1.26 mg/dL — ABNORMAL HIGH (ref 0.61–1.24)
GFR calc Af Amer: >60 mL/min (ref >60)
GFR calc non Af Amer: 52 mL/min — ABNORMAL LOW (ref >60)
Glucose, Bld: 88 mg/dL (ref 70–99)
Potassium: 4 mmol/L (ref 3.5–5.1)
Sodium: 139 mmol/L (ref 135–145)

## 2019-03-28 LAB — CBC
HCT: 44.8 % (ref 39.0–52.0)
Hemoglobin: 14.4 g/dL (ref 13.0–17.0)
MCH: 29.4 pg (ref 26.0–34.0)
MCHC: 32.1 g/dL (ref 30.0–36.0)
MCV: 91.6 fL (ref 80.0–100.0)
Platelets: 121 10*3/uL — ABNORMAL LOW (ref 150–400)
RBC: 4.89 MIL/uL (ref 4.22–5.81)
RDW: 14.9 % (ref 11.5–15.5)
WBC: 10 10*3/uL (ref 4.0–10.5)
nRBC: 0 % (ref 0.0–0.2)

## 2019-03-28 LAB — BRAIN NATRIURETIC PEPTIDE: B Natriuretic Peptide: 2779 pg/mL — ABNORMAL HIGH (ref 0.0–100.0)

## 2019-03-28 MED ORDER — ENOXAPARIN SODIUM 40 MG/0.4ML ~~LOC~~ SOLN
40.0000 mg | SUBCUTANEOUS | Status: DC
  Administered 2019-03-28 – 2019-03-29 (×2): 40 mg via SUBCUTANEOUS
  Filled 2019-03-28 (×3): qty 0.4

## 2019-03-28 MED ORDER — ROSUVASTATIN CALCIUM 10 MG PO TABS
10.0000 mg | ORAL_TABLET | Freq: Every day | ORAL | Status: DC
  Administered 2019-03-29 – 2019-03-30 (×2): 10 mg via ORAL
  Filled 2019-03-28 (×2): qty 1

## 2019-03-28 MED ORDER — SODIUM CHLORIDE 0.9% FLUSH
3.0000 mL | Freq: Two times a day (BID) | INTRAVENOUS | Status: DC
  Administered 2019-03-29 – 2019-03-30 (×3): 3 mL via INTRAVENOUS

## 2019-03-28 MED ORDER — ASPIRIN 81 MG PO CHEW
324.0000 mg | CHEWABLE_TABLET | Freq: Once | ORAL | Status: AC
  Administered 2019-03-28: 324 mg via ORAL
  Filled 2019-03-28: qty 4

## 2019-03-28 MED ORDER — SODIUM CHLORIDE 0.9 % IV SOLN: 250.0000 mL | INTRAVENOUS | Status: DC | PRN

## 2019-03-28 MED ORDER — PANTOPRAZOLE SODIUM 40 MG PO TBEC
40.0000 mg | DELAYED_RELEASE_TABLET | Freq: Every day | ORAL | Status: DC
  Administered 2019-03-29 – 2019-03-30 (×2): 40 mg via ORAL
  Filled 2019-03-28 (×2): qty 1

## 2019-03-28 MED ORDER — ISOSORBIDE MONONITRATE ER 60 MG PO TB24
60.0000 mg | ORAL_TABLET | Freq: Every day | ORAL | Status: DC
  Administered 2019-03-29: 60 mg via ORAL
  Filled 2019-03-28: qty 1

## 2019-03-28 MED ORDER — FUROSEMIDE 10 MG/ML IJ SOLN: 40.0000 mg | Freq: Every day | INTRAMUSCULAR | Status: DC

## 2019-03-28 MED ORDER — AMLODIPINE BESYLATE 10 MG PO TABS
10.0000 mg | ORAL_TABLET | Freq: Every day | ORAL | Status: DC
  Administered 2019-03-29: 10 mg via ORAL
  Filled 2019-03-28: qty 2

## 2019-03-28 MED ORDER — ONDANSETRON HCL 4 MG/2ML IJ SOLN: 4.0000 mg | Freq: Four times a day (QID) | INTRAMUSCULAR | Status: DC | PRN

## 2019-03-28 MED ORDER — CARVEDILOL 6.25 MG PO TABS
6.2500 mg | ORAL_TABLET | Freq: Two times a day (BID) | ORAL | Status: DC
  Administered 2019-03-29 (×2): 6.25 mg via ORAL
  Filled 2019-03-28 (×4): qty 1

## 2019-03-28 MED ORDER — ACETAMINOPHEN 325 MG PO TABS
650.0000 mg | ORAL_TABLET | ORAL | Status: DC | PRN
  Administered 2019-03-28: 650 mg via ORAL
  Filled 2019-03-28: qty 2

## 2019-03-28 MED ORDER — ASPIRIN EC 325 MG PO TBEC
325.0000 mg | DELAYED_RELEASE_TABLET | Freq: Every day | ORAL | Status: DC
  Administered 2019-03-29 – 2019-03-30 (×2): 325 mg via ORAL
  Filled 2019-03-28 (×2): qty 1

## 2019-03-28 MED ORDER — SODIUM CHLORIDE 0.9% FLUSH: 3.0000 mL | INTRAVENOUS | Status: DC | PRN

## 2019-03-28 MED ORDER — RANOLAZINE ER 500 MG PO TB12
500.0000 mg | ORAL_TABLET | Freq: Two times a day (BID) | ORAL | Status: DC
  Administered 2019-03-29 – 2019-03-30 (×4): 500 mg via ORAL
  Filled 2019-03-28 (×5): qty 1

## 2019-03-28 MED ORDER — FUROSEMIDE 10 MG/ML IJ SOLN
40.0000 mg | Freq: Once | INTRAMUSCULAR | Status: AC
  Administered 2019-03-28: 40 mg via INTRAVENOUS
  Filled 2019-03-28: qty 4

## 2019-03-28 MED ORDER — SPIRONOLACTONE 25 MG PO TABS
25.0000 mg | ORAL_TABLET | Freq: Every day | ORAL | Status: DC
  Administered 2019-03-29 – 2019-03-30 (×2): 25 mg via ORAL
  Filled 2019-03-28 (×2): qty 1

## 2019-03-28 MED ORDER — FUROSEMIDE 10 MG/ML IJ SOLN
40.0000 mg | Freq: Every day | INTRAMUSCULAR | Status: DC
  Administered 2019-03-29: 40 mg via INTRAVENOUS
  Filled 2019-03-28: qty 4

## 2019-03-28 NOTE — Progress Notes (Signed)
Date:  03/28/2019   Name:  Kenneth Mitchell   DOB:  02-14-1934   MRN:  [id number]   Chief Complaint: Shortness of Breath (laying flat- gets worse/ gained 9 lbs in 3 days)  Shortness of Breath This is a recurrent problem. The current episode started more than 1 year ago. The problem occurs intermittently. The problem has been waxing and waning. Associated symptoms include leg swelling. Pertinent negatives include no abdominal pain, chest pain, claudication, coryza, ear pain, fever, headaches, hemoptysis, leg pain, neck pain, orthopnea, PND, rash, rhinorrhea, sore throat, sputum production, swollen glands, syncope, vomiting or wheezing. Exacerbated by: ? not taking spironolactone. The treatment provided moderate relief. His past medical history is significant for CAD, COPD and a heart failure. There is no history of allergies, aspirin allergies, asthma, bronchiolitis, chronic lung disease, DVT, PE, pneumonia or a recent surgery.    Lab Results  Component Value Date   CREATININE 1.53 (H) 03/01/2019   BUN 20 03/01/2019   NA 138 03/01/2019   K 3.5 03/01/2019   CL 103 03/01/2019   CO2 25 03/01/2019   Lab Results  Component Value Date   CHOL 155 11/29/2014   HDL 27 (L) 11/29/2014   LDLCALC 74 11/29/2014   TRIG 271 (H) 11/29/2014   CHOLHDL 5.7 (H) 11/29/2014   No results found for: TSH Lab Results  Component Value Date   HGBA1C 6.5 (H) 02/28/2019     Review of Systems  Constitutional: Negative for chills and fever.  HENT: Negative for drooling, ear discharge, ear pain, postnasal drip, rhinorrhea and sore throat.   Respiratory: Positive for shortness of breath. Negative for cough, hemoptysis, sputum production, chest tightness and wheezing.   Cardiovascular: Positive for leg swelling. Negative for chest pain, palpitations, orthopnea, claudication, syncope and PND.  Gastrointestinal: Negative for abdominal pain, blood in stool, constipation, diarrhea, nausea and vomiting.  Endocrine:  Negative for polydipsia.  Genitourinary: Negative for dysuria, frequency, hematuria and urgency.  Musculoskeletal: Negative for back pain, myalgias and neck pain.  Skin: Negative for rash.  Allergic/Immunologic: Negative for environmental allergies.  Neurological: Negative for dizziness and headaches.  Hematological: Does not bruise/bleed easily.  Psychiatric/Behavioral: Negative for suicidal ideas. The patient is not nervous/anxious.     Patient Active Problem List   Diagnosis Date Noted  . Elevated troponin   . Hematuria 02/28/2019  . Liver lesion 02/28/2019  . CKD (chronic kidney disease), stage IIIa 02/28/2019  . Hypertension   . Aortic dissection, thoracoabdominal (HCC)   . Acute on chronic systolic CHF (congestive heart failure) (HCC)   . Thrombocytopenia (HCC)   . Type II diabetes mellitus with renal manifestations (HCC)   . Chest pain   . NSTEMI (non-ST elevated myocardial infarction) (HCC) 02/27/2019  . Coronary artery disease involving coronary bypass graft of native heart with angina pectoris (HCC) 10/29/2018  . Acute abdominal pain 10/05/2017  . Type 2 diabetes mellitus without complication, without long-term current use of insulin (HCC) 01/30/2017  . Gross hematuria 03/26/2015  . History of elevated PSA 03/26/2015  . Nodular prostate 03/26/2015  . Insect bite 12/08/2014  . Arteriosclerosis of coronary artery 11/29/2014  . AAA (abdominal aortic aneurysm) (HCC) 10/04/2013  . Carotid artery narrowing 10/04/2013  . Dyslipidemia 10/04/2013  . BP (high blood pressure) 10/04/2013  . Excessive urination at night 10/04/2013    No Known Allergies  Past Surgical History:  Procedure Laterality Date  . aneurysm surgery     x 2  . LEFT HEART  CATH AND CORS/GRAFTS ANGIOGRAPHY N/A 02/28/2019   Procedure: LEFT HEART CATH AND CORS/GRAFTS ANGIOGRAPHY possible PCI and stent;  Surgeon: Yolonda Kida, MD;  Location: [facility];  Service: Cardiovascular;  Laterality:  N/A;  . quadrupal bypass      Social History   Tobacco Use  . Smoking status: Former Research scientist (life sciences)  . Smokeless tobacco: Never Used  . Tobacco comment: quit 20 years  Substance Use Topics  . Alcohol use: No    Alcohol/week: 0.0 standard drinks  . Drug use: No     Medication list has been reviewed and updated.  Current Meds  Medication Sig  . amLODipine (NORVASC) 10 MG tablet Take 1 tablet (10 mg total) by mouth daily at 6 (six) AM.  . Aspirin Buf,CaCarb-MgCarb-MgO, 81 MG TABS Take by mouth.  Marland Kitchen aspirin EC 325 MG EC tablet Take 1 tablet (325 mg total) by mouth daily.  . carvedilol (COREG) 6.25 MG tablet Take 1 tablet (6.25 mg total) by mouth 2 (two) times daily with a meal.  . furosemide (LASIX) 40 MG tablet Take 1 tablet (40 mg total) by mouth daily.  Marland Kitchen glipiZIDE (GLUCOTROL) 5 MG tablet TAKE 1 TABLET BY MOUTH TWICE DAILY BEFORE A MEAL  . Glucose Blood (ONETOUCH ULTRA BLUE VI) Use 2 (two) times daily DX: E11.29  . isosorbide mononitrate (IMDUR) 60 MG 24 hr tablet Take 1 tablet (60 mg total) by mouth daily at 6 (six) AM.  . lisinopril (ZESTRIL) 5 MG tablet Take 1 tablet (5 mg total) by mouth daily.  . metFORMIN (GLUCOPHAGE) 500 MG tablet Take 1 tablet (500 mg total) by mouth 2 (two) times daily with a meal.  . nitroGLYCERIN (NITRODUR - DOSED IN MG/24 HR) 0.2 mg/hr patch UNWRAP AND APPLY 1 PATCH ONCE DAILY, LEAVE ON FOR 12-14 HOURS(WAIT 10-12 HOURS BEFORE NEXT PATCH)  . pantoprazole (PROTONIX) 40 MG tablet Take 1 tablet (40 mg total) by mouth daily.  . rosuvastatin (CRESTOR) 10 MG tablet Take 1 tablet (10 mg total) by mouth daily.  . sildenafil (REVATIO) 20 MG tablet 3-5 tabs every 36 hours as necessary    PHQ 2/9 Scores 04/23/2017 01/30/2017 01/11/2016 11/29/2014  PHQ - 2 Score 0 0 0 0  PHQ- 9 Score 1 - - -    BP Readings from Last 3 Encounters:  03/28/19 120/80  03/25/19 130/84  03/21/19 (!) 134/97    Physical Exam Vitals signs and nursing note reviewed.  Cardiovascular:      Rate and Rhythm: Normal rate.     Heart sounds: S1 normal and S2 normal. No systolic murmur. No diastolic murmur. Gallop present. S4 sounds present.   Pulmonary:     Effort: Tachypnea present.     Breath sounds: Decreased breath sounds and rales present. No wheezing or rhonchi.  Musculoskeletal:     Right lower leg: Edema present.  Neurological:     Mental Status: He is alert.     Wt Readings from Last 3 Encounters:  03/28/19 193 lb (87.5 kg)  03/25/19 184 lb (83.5 kg)  03/21/19 184 lb (83.5 kg)    BP 120/80   Pulse 66   Ht 5\' 10"  (1.778 m)   Wt 193 lb (87.5 kg)   SpO2 99%   BMI 27.69 kg/m   Assessment and Plan:  1. Acute on chronic congestive heart failure, unspecified heart failure type [facility]) Patient recently discharged from hospital with ejection fraction of 35 to 40%.  Patient had a non-STEMI but patient denies  that he had a heart attack patient has had recent increase in dyspnea on exertion and orthopnea without PND.  Patient does have coronary artery disease followed by Dr. Vennie Hom[place]alderwood.  Previous it was noted on x-ray the patient had a small pleural effusion.  Exam notes that there is decreased breath sounds halfway up on the right but two thirds of the way up on the left.  With recent weight gain and the stoppage of spironolactone and Ranexa I am concerned about patient may have acute on chronic CHF.  Patient is to go to the hospital in refuses to go by ambulance because he has to take his dog home first.  I am also concerned about the possibility of a pulmonary embolus because patient decided to drive himself to [place] and back.  Patient is going to the hospital for possibly further diuresis and evaluation of CHF/possible PE/coronary event.  2. DOE (dyspnea on exertion) Dyspnea on exertion patient has had to some degree but is gotten worse that he is unable to walk any distance at all.  3. Orthopnea Patient is unable to lie supine.  4. Weight gain By our weight  patient is gained 9 pounds since his last recorded weight I feel this is fluid given that he has not been on spironolactone.  5. Noncompliance I do not know why he is not taking Ranexa nor the spironolactone whether it was stopped by medical personnel or by patient is difficult to tell

## 2019-03-28 NOTE — H&P (Addendum)
History and Physical    Kenneth Mitchell:[id number] DOB: 1933/08/14 DOA: 03/28/2019  PCP: Duanne Limerick, MD  Patient coming from: Home  I have personally briefly reviewed patient's old medical records in [facility]  Chief Complaint: worsening shortness of breath  HPI: Kenneth Mitchell is a 85 y.o. male with medical history significant of CAD s/p 4-vessel CABG, PAD s/p AAA repair, chronic systolic heart failure type 2 diabetes, hypertension, hyperlipidemia, and recent NSTEMI who presents with progressive worsening shortness of breath for the past month.  Patient appears frustrated and is only able to give a brief history. Patient reports that for the past month he has noticed shortness of breath with dyspnea, orthopnea and is unable to sleep due to his breathing. Also has lower extremity edema.  Daughter at bedside also reports that he has not been compliant with his medication.  Patient states that he sometimes just forgets.  When his recent admission of NSTEMI was discussed patient reports that "it is the same shit that has been happen for 7 years" and that he did not have a heart attack.  He endorsed constant midsternal chest pain that radiates up right arm for the past 7 years that is no worse today.  No fever or cough. Has pain with urinating due to his indwelling Foley.  Denies any nausea vomiting or diarrhea.  Patient was recently admitted from 11/8-11/10 and had NSTEMI and underwent cardiac cath with "multivessel disease high-grade lesions in graft, Subclavian appears to be occluded difficult to access, known occlusion of right graft and right coronary artery" with medical management recommended by cardiology. He also had urinary retention and hematuria during that admission and has an indwelling Foley with outpatient urology follow-up.   ED Course: He was normotensive on room air.  CBC shows no leukocytosis or anemia.  Platelet of 121.  CMP shows glucose of 88, creatinine of 1.26  which is actually improved from 3 weeks ago of 1.53.  BNP of 2779.  Troponin initially at 6091 then at 5353.  This is actually a decrease from 8599 from prior admission on 11/10.  ED physician discussed case with cardiology who recommended no heparin drip and keeping patient only on aspirin.  Review of Systems:  Unable to fully obtain as patient was minimally cooperative with obtaining HPI  Past Medical History:  Diagnosis Date  . CAD (coronary artery disease)   . Diabetes mellitus (HCC)   . Gross hematuria   . Hyperlipidemia   . Hypertension   . Inguinal hernia   . Nodular prostate   . Syncope     Past Surgical History:  Procedure Laterality Date  . aneurysm surgery     x 2  . LEFT HEART CATH AND CORS/GRAFTS ANGIOGRAPHY N/A 02/28/2019   Procedure: LEFT HEART CATH AND CORS/GRAFTS ANGIOGRAPHY possible PCI and stent;  Surgeon: Alwyn Pea, MD;  Location: [facility];  Service: Cardiovascular;  Laterality: N/A;  . quadrupal bypass       reports that he has quit smoking. He has never used smokeless tobacco. He reports that he does not drink alcohol or use drugs.  No Known Allergies  Family History  Problem Relation Age of Onset  . Diabetes Mother   . Heart disease Father   . Stroke Father   . Cancer Brother   . Kidney disease Neg Hx   . Prostate cancer Neg Hx      Prior to Admission medications   Medication Sig  Start Date End Date Taking? Authorizing Provider  amLODipine (NORVASC) 10 MG tablet Take 1 tablet (10 mg total) by mouth daily at 6 (six) AM. 10/29/18  Yes Duanne LimerickJones, Deanna C, MD  aspirin EC 325 MG EC tablet Take 1 tablet (325 mg total) by mouth daily. 03/02/19  Yes Lorretta HarpNiu, Xilin, MD  carvedilol (COREG) 6.25 MG tablet Take 1 tablet (6.25 mg total) by mouth 2 (two) times daily with a meal. 10/29/18  Yes Duanne LimerickJones, Deanna C, MD  furosemide (LASIX) 40 MG tablet Take 1 tablet (40 mg total) by mouth daily. 10/29/18  Yes Duanne LimerickJones, Deanna C, MD  glipiZIDE (GLUCOTROL) 5 MG  tablet TAKE 1 TABLET BY MOUTH TWICE DAILY BEFORE A MEAL Patient taking differently: Take 5 mg by mouth 2 (two) times daily before a meal.  04/16/18  Yes Duanne LimerickJones, Deanna C, MD  isosorbide mononitrate (IMDUR) 60 MG 24 hr tablet Take 1 tablet (60 mg total) by mouth daily at 6 (six) AM. 03/02/19  Yes Lorretta HarpNiu, Xilin, MD  lisinopril (ZESTRIL) 5 MG tablet Take 1 tablet (5 mg total) by mouth daily. 10/29/18 10/29/19 Yes Duanne LimerickJones, Deanna C, MD  metFORMIN (GLUCOPHAGE) 500 MG tablet Take 1 tablet (500 mg total) by mouth 2 (two) times daily with a meal. 04/16/18  Yes Duanne LimerickJones, Deanna C, MD  pantoprazole (PROTONIX) 40 MG tablet Take 1 tablet (40 mg total) by mouth daily. 03/02/19  Yes Lorretta HarpNiu, Xilin, MD  rosuvastatin (CRESTOR) 10 MG tablet Take 1 tablet (10 mg total) by mouth daily. 10/29/18  Yes Duanne LimerickJones, Deanna C, MD  sildenafil (REVATIO) 20 MG tablet Take 60-100 mg by mouth daily as needed.    Yes [provider]  ranolazine (RANEXA) 1000 MG SR tablet Take 1 tablet (1,000 mg total) by mouth 2 (two) times daily. Patient not taking: Reported on 03/28/2019 03/01/19   Lorretta HarpNiu, Xilin, MD  spironolactone (ALDACTONE) 25 MG tablet Take 1 tablet (25 mg total) by mouth daily. Patient not taking: Reported on 03/28/2019 10/12/18   Duanne LimerickJones, Deanna C, MD    Physical Exam: Vitals:   03/28/19 1930 03/28/19 1945 03/28/19 2000 03/28/19 2030  BP: (!) 140/94  (!) 120/96 (!) 139/91  Pulse: 69 (!) 137 75 70  Resp: (!) 32 (!) 22 (!) 27 (!) 26  SpO2: 99% (!) 87% 91% 99%  Weight:      Height:        Constitutional: NAD, calm, comfortable male sitting upright in bed Vitals:   03/28/19 1930 03/28/19 1945 03/28/19 2000 03/28/19 2030  BP: (!) 140/94  (!) 120/96 (!) 139/91  Pulse: 69 (!) 137 75 70  Resp: (!) 32 (!) 22 (!) 27 (!) 26  SpO2: 99% (!) 87% 91% 99%  Weight:      Height:       Eyes:  lids and conjunctivae normal ENMT: Mucous membranes are moist. Posterior pharynx clear of any exudate or lesions. Neck: normal, supple, no masses,   Respiratory: clear to auscultation bilaterally, no wheezing, no crackles. Normal respiratory effort on room air.  Cardiovascular: Regular rate and rhythm, no murmurs / rubs / gallops.+ 3 pitting edema up to knee bilaterally.  Abdomen: no tenderness, Bowel sounds positive.  Musculoskeletal: no clubbing / cyanosis. No joint deformity upper and lower extremities. Good ROM, no contractures. Normal muscle tone.  Skin: no rashes, lesions, ulcers. No induration Neurologic: CN 2-12 grossly intact. Sensation intact, Strength 5/5 in all 4.  Psychiatric:  Alert and oriented x 3.  Frustrated and irritated.  Labs on Admission:  I have personally reviewed following labs and imaging studies  CBC: Recent Labs  Lab 03/28/19 1620  WBC 10.0  HGB 14.4  HCT 44.8  MCV 91.6  PLT 053*   Basic Metabolic Panel: Recent Labs  Lab 03/28/19 1620  NA 139  K 4.0  CL 108  CO2 18*  GLUCOSE 88  BUN 20  CREATININE 1.26*  CALCIUM 9.8   GFR: Estimated Creatinine Clearance: 45.9 mL/min (A) (by C-G formula based on SCr of 1.26 mg/dL (H)). Liver Function Tests: No results for input(s): AST, ALT, ALKPHOS, BILITOT, PROT, ALBUMIN in the last 168 hours. No results for input(s): LIPASE, AMYLASE in the last 168 hours. No results for input(s): AMMONIA in the last 168 hours. Coagulation Profile: No results for input(s): INR, PROTIME in the last 168 hours. Cardiac Enzymes: No results for input(s): CKTOTAL, CKMB, CKMBINDEX, TROPONINI in the last 168 hours. BNP (last 3 results) No results for input(s): PROBNP in the last 8760 hours. HbA1C: No results for input(s): HGBA1C in the last 72 hours. CBG: No results for input(s): GLUCAP in the last 168 hours. Lipid Profile: No results for input(s): CHOL, HDL, LDLCALC, TRIG, CHOLHDL, LDLDIRECT in the last 72 hours. Thyroid Function Tests: No results for input(s): TSH, T4TOTAL, FREET4, T3FREE, THYROIDAB in the last 72 hours. Anemia Panel: No results for input(s):  VITAMINB12, FOLATE, FERRITIN, TIBC, IRON, RETICCTPCT in the last 72 hours. Urine analysis:    Component Value Date/Time   COLORURINE COLORLESS (A) 02/28/2019 0511   APPEARANCEUR CLEAR (A) 02/28/2019 0511   APPEARANCEUR Clear 06/25/2011 0613   LABSPEC 1.011 02/28/2019 0511   LABSPEC >1.060 06/25/2011 0613   PHURINE 5.0 02/28/2019 0511   GLUCOSEU NEGATIVE 02/28/2019 0511   GLUCOSEU Negative 06/25/2011 0613   HGBUR SMALL (A) 02/28/2019 0511   BILIRUBINUR NEGATIVE 02/28/2019 0511   BILIRUBINUR negative 03/19/2015 1041   BILIRUBINUR Negative 06/25/2011 0613   KETONESUR NEGATIVE 02/28/2019 0511   PROTEINUR NEGATIVE 02/28/2019 0511   UROBILINOGEN 0.2 03/19/2015 1041   NITRITE NEGATIVE 02/28/2019 0511   LEUKOCYTESUR NEGATIVE 02/28/2019 0511   LEUKOCYTESUR Negative 06/25/2011 0613    Radiological Exams on Admission: Dg Chest 2 View  Result Date: 03/28/2019 CLINICAL DATA:  Chest pain. EXAM: CHEST - 2 VIEW COMPARISON:  February 27, 2019. FINDINGS: Stable cardiomegaly. Status post coronary bypass graft. No pneumothorax is noted. Old left rib fractures are noted. Left lung is clear. Minimal right basilar subsegmental atelectasis is noted with small right pleural effusion. IMPRESSION: Minimal right basilar subsegmental atelectasis is noted with small right pleural effusion. Stable cardiomegaly. Electronically Signed   By: Marijo Conception M.D.   On: 03/28/2019 16:54    EKG: Hard copy reviewed since not yet in chart. NSR with RBBB and LAFB.  Not significantly changed from prior EKG.  Assessment/Plan  Acute on chronic systolic heart failure in the setting of medication non-compliant - echo on 02/28/2019 showed LV of 30-35%  -Takes 40 mg Lasix daily at home. Start IV 40mg  here daily. -Continue Coreg, spironolactone  - strict Is and Os - daily weights  Elevated troponin in the setting of CAD s/p four vessel CABG/recent NSTEMI in Nov - Troponin elevated to 6000 but was at around 8000 at discharge  of last admission - EDP discussed with cardiology and recommended only aspirin - continue aspirin - continu Imdur, ranexa  Elevated creatinine - mildly elevated creatinine - monitor with IV diuresis  Hypertension -Continue amlodipine - hold Lisinopril for now with mildly elevated creatinine and IV  diuresis  Urinary retention - since last admission - has foley in place - continue follow up with urology outpatient  Type 2 diabetes - sensitive sliding scale   DVT prophylaxis:.Lovenox Code Status:Full  Family Communication: Plan discussed with patient and daughter at bedside  disposition Plan: Home with at least 2 midnight stays  Consults called:  Admission status: inpatient  Kenneth Deem T Keily Lepp DO [facility]   If 7PM-7AM, please contact night-coverage [email or web address] Password [facility]  03/28/2019, 10:32 PM

## 2019-03-28 NOTE — ED Notes (Signed)
Admitting Md at bedside

## 2019-03-28 NOTE — ED Triage Notes (Signed)
Pt sent by MD office for 9lb weight gain and pain in chest that pt reports he has been having for 7 years. Pt states that he feels short of breath but always feels this way.

## 2019-03-28 NOTE — Telephone Encounter (Signed)
Kenneth Mitchell calls in asking to speak with dr Ronnald Ramp or tara, I said to him they are in between patients now and is there something that I could do for him. He says that he can not breath, I advised him going to ER. He still insist that he speaks with dr Ronnald Ramp. I told him he would have to hold on the phone. He said he will try to hold but also said his mins were low on his phone and will need to reload it, then he had hung up.

## 2019-03-28 NOTE — ED Notes (Signed)
Pt's urine leg bag changed and new one placed. Pt advised this RN on how to fix straps. Pt wants them a certain way. Pt placed in gown and given warm blanket.

## 2019-03-28 NOTE — ED Provider Notes (Signed)
[facility] Emergency Department Provider Note  ____________________________________________   First MD Initiated Contact with Patient 03/28/19 1807     (approximate)  I have reviewed the triage vital signs and the nursing notes.   HISTORY  Chief Complaint Weight Gain    HPI Kenneth Mitchell is a 85 y.o. male with past medical history as below here with shortness of breath.  The patient has an extensive prior coronary history including recent admission for NSTEMI in November, with essentially 100% disease in essentially all coronary vessels, with EF 30 to 35%.  He states that over the last 2 weeks or so, he was previously hospitalized in [place] for UTI.  A Foley catheter was placed.  He states that since then, he has had some difficulty urinating, that the Foley remains in place.  He states that he has also developed approximately 9 pound weight gain since hospitalization and has had progressively worsening dyspnea.  He describes shortness of breath, that is worse when lying flat, but also particularly worse with exertion.  He has had increasing chest pressure.  This chest pressure is what he has felt off and on for reportedly 7 years, and is similar to his NSTEMI pain, though he denies that it is as severe.  He states that it is more so shortness of breath and chest fullness that he is feeling.  No fevers or chills.  No cough.        Past Medical History:  Diagnosis Date   CAD (coronary artery disease)    Diabetes mellitus (HCC)    Gross hematuria    Hyperlipidemia    Hypertension    Inguinal hernia    Nodular prostate    Syncope     Patient Active Problem List   Diagnosis Date Noted   Acute CHF (congestive heart failure) (HCC) 03/28/2019   Elevated troponin    Hematuria 02/28/2019   Liver lesion 02/28/2019   CKD (chronic kidney disease), stage IIIa 02/28/2019   Hypertension    Aortic dissection, thoracoabdominal (HCC)     Acute on chronic systolic CHF (congestive heart failure) (HCC)    Thrombocytopenia (HCC)    Type II diabetes mellitus with renal manifestations (HCC)    Chest pain    NSTEMI (non-ST elevated myocardial infarction) (HCC) 02/27/2019   Coronary artery disease involving coronary bypass graft of native heart with angina pectoris (HCC) 10/29/2018   Acute abdominal pain 10/05/2017   Type 2 diabetes mellitus without complication, without long-term current use of insulin (HCC) 01/30/2017   Gross hematuria 03/26/2015   History of elevated PSA 03/26/2015   Nodular prostate 03/26/2015   Insect bite 12/08/2014   Arteriosclerosis of coronary artery 11/29/2014   AAA (abdominal aortic aneurysm) (HCC) 10/04/2013   Carotid artery narrowing 10/04/2013   Dyslipidemia 10/04/2013   BP (high blood pressure) 10/04/2013   Excessive urination at night 10/04/2013    Past Surgical History:  Procedure Laterality Date   aneurysm surgery     x 2   LEFT HEART CATH AND CORS/GRAFTS ANGIOGRAPHY N/A 02/28/2019   Procedure: LEFT HEART CATH AND CORS/GRAFTS ANGIOGRAPHY possible PCI and stent;  Surgeon: Alwyn Pea, MD;  Location: [facility] INVASIVE CV LAB;  Service: Cardiovascular;  Laterality: N/A;   quadrupal bypass      Prior to Admission medications   Medication Sig Start Date End Date Taking? Authorizing Provider  amLODipine (NORVASC) 10 MG tablet Take 1 tablet (10 mg total) by mouth daily at 6 (six)  AM. 10/29/18  Yes Duanne Limerick, MD  aspirin EC 325 MG EC tablet Take 1 tablet (325 mg total) by mouth daily. 03/02/19  Yes Lorretta Harp, MD  carvedilol (COREG) 6.25 MG tablet Take 1 tablet (6.25 mg total) by mouth 2 (two) times daily with a meal. 10/29/18  Yes Duanne Limerick, MD  furosemide (LASIX) 40 MG tablet Take 1 tablet (40 mg total) by mouth daily. 10/29/18  Yes Duanne Limerick, MD  glipiZIDE (GLUCOTROL) 5 MG tablet TAKE 1 TABLET BY MOUTH TWICE DAILY BEFORE A MEAL Patient taking differently:  Take 5 mg by mouth 2 (two) times daily before a meal.  04/16/18  Yes Duanne Limerick, MD  isosorbide mononitrate (IMDUR) 60 MG 24 hr tablet Take 1 tablet (60 mg total) by mouth daily at 6 (six) AM. 03/02/19  Yes Lorretta Harp, MD  lisinopril (ZESTRIL) 5 MG tablet Take 1 tablet (5 mg total) by mouth daily. 10/29/18 10/29/19 Yes Duanne Limerick, MD  metFORMIN (GLUCOPHAGE) 500 MG tablet Take 1 tablet (500 mg total) by mouth 2 (two) times daily with a meal. 04/16/18  Yes Duanne Limerick, MD  pantoprazole (PROTONIX) 40 MG tablet Take 1 tablet (40 mg total) by mouth daily. 03/02/19  Yes Lorretta Harp, MD  rosuvastatin (CRESTOR) 10 MG tablet Take 1 tablet (10 mg total) by mouth daily. 10/29/18  Yes Duanne Limerick, MD  sildenafil (REVATIO) 20 MG tablet Take 60-100 mg by mouth daily as needed.    Yes [provider]  ranolazine (RANEXA) 1000 MG SR tablet Take 1 tablet (1,000 mg total) by mouth 2 (two) times daily. Patient not taking: Reported on 03/28/2019 03/01/19   Lorretta Harp, MD  spironolactone (ALDACTONE) 25 MG tablet Take 1 tablet (25 mg total) by mouth daily. Patient not taking: Reported on 03/28/2019 10/12/18   Duanne Limerick, MD    Allergies Patient has no known allergies.  Family History  Problem Relation Age of Onset   Diabetes Mother    Heart disease Father    Stroke Father    Cancer Brother    Kidney disease Neg Hx    Prostate cancer Neg Hx     Social History Social History   Tobacco Use   Smoking status: Former Smoker   Smokeless tobacco: Never Used   Tobacco comment: quit 20 years  Substance Use Topics   Alcohol use: No    Alcohol/week: 0.0 standard drinks   Drug use: No    Review of Systems  Review of Systems  Constitutional: Positive for fatigue. Negative for chills and fever.  HENT: Negative for sore throat.   Respiratory: Positive for cough and shortness of breath.   Cardiovascular: Positive for leg swelling. Negative for chest pain.  Gastrointestinal:  Negative for abdominal pain.  Genitourinary: Negative for flank pain.  Musculoskeletal: Negative for neck pain.  Skin: Negative for rash and wound.  Allergic/Immunologic: Negative for immunocompromised state.  Neurological: Positive for weakness. Negative for numbness.  Hematological: Does not bruise/bleed easily.  All other systems reviewed and are negative.    ____________________________________________  PHYSICAL EXAM:      VITAL SIGNS: ED Triage Vitals  Enc Vitals Group     BP 03/28/19 1602 (!) 133/94     Pulse Rate 03/28/19 1602 71     Resp 03/28/19 1602 18     Temp --      Temp src --      SpO2 03/28/19 1602 100 %  Weight 03/28/19 1603 191 lb 12.8 oz (87 kg)     Height 03/28/19 1603  (1.778 m)     Head Circumference --      Peak Flow --      Pain Score 03/28/19 1603 0     Pain Loc --      Pain Edu? --      Excl. in GC? --      Physical Exam Vitals signs and nursing note reviewed.  Constitutional:      General: He is not in acute distress.    Appearance: He is well-developed.  HENT:     Head: Normocephalic and atraumatic.  Eyes:     Conjunctiva/sclera: Conjunctivae normal.  Neck:     Musculoskeletal: Neck supple.  Cardiovascular:     Rate and Rhythm: Normal rate and regular rhythm.     Heart sounds: Normal heart sounds. No murmur. No friction rub.  Pulmonary:     Effort: Pulmonary effort is normal. No respiratory distress.     Breath sounds: Rales (Bibasilar) present. No wheezing.  Abdominal:     General: There is no distension.     Palpations: Abdomen is soft.     Tenderness: There is no abdominal tenderness.  Musculoskeletal:     Right lower leg: Edema (2+ pitting) present.     Left lower leg: Edema (2+ pitting) present.  Skin:    General: Skin is warm.     Capillary Refill: Capillary refill takes less than 2 seconds.  Neurological:     Mental Status: He is alert and oriented to person, place, and time.     Motor: No abnormal muscle tone.        ____________________________________________   LABS (all labs ordered are listed, but only abnormal results are displayed)  Labs Reviewed  BASIC METABOLIC PANEL - Abnormal; Notable for the following components:      Result Value   CO2 18 (*)    Creatinine, Ser 1.26 (*)    GFR calc non Af Amer 52 (*)    All other components within normal limits  CBC - Abnormal; Notable for the following components:   Platelets 121 (*)    All other components within normal limits  BRAIN NATRIURETIC PEPTIDE - Abnormal; Notable for the following components:   B Natriuretic Peptide 2,779.0 (*)    All other components within normal limits  TROPONIN I (HIGH SENSITIVITY) - Abnormal; Notable for the following components:   Troponin I (High Sensitivity) 6,091 (*)    All other components within normal limits  TROPONIN I (HIGH SENSITIVITY) - Abnormal; Notable for the following components:   Troponin I (High Sensitivity) 5,353 (*)    All other components within normal limits  SARS CORONAVIRUS 2 (TAT 6-24 HRS)  BASIC METABOLIC PANEL    ____________________________________________  EKG: Sinus rhythm, ventricular rate 72.  PR 150, QRS 158, QTc 479.  Nonspecific T wave changes.  When compared to prior, no acute ST elevations or depressions. ________________________________________  RADIOLOGY All imaging, including plain films, CT scans, and ultrasounds, independently reviewed by me, and interpretations confirmed via formal radiology reads.  ED MD interpretation:   Chest x-ray: Minimal right atelectasis, small right pleural effusion, cardiomegaly  Official radiology report(s): Dg Chest 2 View  Result Date: 03/28/2019 CLINICAL DATA:  Chest pain. EXAM: CHEST - 2 VIEW COMPARISON:  February 27, 2019. FINDINGS: Stable cardiomegaly. Status post coronary bypass graft. No pneumothorax is noted. Old left rib fractures are noted. Left lung is  clear. Minimal right basilar subsegmental atelectasis is noted with  small right pleural effusion. IMPRESSION: Minimal right basilar subsegmental atelectasis is noted with small right pleural effusion. Stable cardiomegaly. Electronically Signed   By: Marijo Conception M.D.   On: 03/28/2019 16:54    ____________________________________________  PROCEDURES   Procedure(s) performed (including Critical Care):  Procedures  ____________________________________________  INITIAL IMPRESSION / MDM / [place] / ED COURSE  As part of my medical decision making, I reviewed the following data within the [place] notes reviewed and incorporated, Old chart reviewed, Notes from prior ED visits, and [place] Controlled Substance Database       *DRAKO MAESE was evaluated in Emergency Department on 03/29/2019 for the symptoms described in the history of present illness. He was evaluated in the context of the global COVID-19 pandemic, which necessitated consideration that the patient might be at risk for infection with the SARS-CoV-2 virus that causes COVID-19. Institutional protocols and algorithms that pertain to the evaluation of patients at risk for COVID-19 are in a state of rapid change based on information released by regulatory bodies including the CDC and federal and state organizations. These policies and algorithms were followed during the patient's care in the ED.  Some ED evaluations and interventions may be delayed as a result of limited staffing during the pandemic.*     Medical Decision Making:  85 yo M here with SOB. H/o severe multivessel CAD, CHF s/p recent NSTEMI admission. On arrival, pt in NAD but does appear dyspneic. He has increased pitting edema b/l. Trop markedly elevatd though down from last visit, though I suspect there could be a component of ongoing underlying demand ischemia 2/2 his severe CAD. Likely acute on chronic CHF w/ demand. Discussed with Dr. Saralyn Pilar - will hold on heparin unless active CP or uptrending  trop, and admit for diuresis.  ____________________________________________  FINAL CLINICAL IMPRESSION(S) / ED DIAGNOSES  Final diagnoses:  Acute on chronic systolic congestive heart failure (HCC)  Elevated troponin     MEDICATIONS GIVEN DURING THIS VISIT:  Medications  sodium chloride flush (NS) 0.9 % injection 3 mL ( Intravenous Canceled Entry 03/28/19 2206)  sodium chloride flush (NS) 0.9 % injection 3 mL (has no administration in time range)  0.9 %  sodium chloride infusion (has no administration in time range)  acetaminophen (TYLENOL) tablet 650 mg (650 mg Oral Given 03/28/19 2323)  ondansetron (ZOFRAN) injection 4 mg (has no administration in time range)  enoxaparin (LOVENOX) injection 40 mg (40 mg Subcutaneous Given 03/28/19 2331)  furosemide (LASIX) injection 40 mg (has no administration in time range)  aspirin EC tablet 325 mg (has no administration in time range)  amLODipine (NORVASC) tablet 10 mg (has no administration in time range)  carvedilol (COREG) tablet 6.25 mg (has no administration in time range)  isosorbide mononitrate (IMDUR) 24 hr tablet 60 mg (has no administration in time range)  ranolazine (RANEXA) 12 hr tablet 500 mg (500 mg Oral Given 03/29/19 0046)  rosuvastatin (CRESTOR) tablet 10 mg (has no administration in time range)  spironolactone (ALDACTONE) tablet 25 mg (has no administration in time range)  pantoprazole (PROTONIX) EC tablet 40 mg (has no administration in time range)  insulin aspart (novoLOG) injection 0-9 Units (has no administration in time range)  furosemide (LASIX) injection 40 mg (40 mg Intravenous Given 03/28/19 1929)  aspirin chewable tablet 324 mg (324 mg Oral Given 03/28/19 1928)     ED Discharge Orders  None       Note:  This document was prepared using Dragon voice recognition software and may include unintentional dictation errors.   Shaune PollackIsaacs, Maleka Contino, MD 03/29/19 [phone]

## 2019-03-28 NOTE — Telephone Encounter (Signed)
Pt coming in

## 2019-03-29 DIAGNOSIS — I5023 Acute on chronic systolic (congestive) heart failure: Secondary | ICD-10-CM

## 2019-03-29 DIAGNOSIS — E1129 Type 2 diabetes mellitus with other diabetic kidney complication: Secondary | ICD-10-CM

## 2019-03-29 DIAGNOSIS — I159 Secondary hypertension, unspecified: Secondary | ICD-10-CM

## 2019-03-29 LAB — GLUCOSE, CAPILLARY
Glucose-Capillary: 79 mg/dL (ref 70–99)
Glucose-Capillary: 117 mg/dL — ABNORMAL HIGH (ref 70–99)
Glucose-Capillary: 111 mg/dL — ABNORMAL HIGH (ref 70–99)

## 2019-03-29 LAB — BASIC METABOLIC PANEL
Anion gap: 14 (ref 5–15)
BUN: 20 mg/dL (ref 8–23)
CO2: 18 mmol/L — ABNORMAL LOW (ref 22–32)
Calcium: 9.4 mg/dL (ref 8.9–10.3)
Chloride: 108 mmol/L (ref 98–111)
Creatinine, Ser: 1.17 mg/dL (ref 0.61–1.24)
GFR calc Af Amer: >60 mL/min (ref >60)
GFR calc non Af Amer: 57 mL/min — ABNORMAL LOW (ref >60)
Glucose, Bld: 97 mg/dL (ref 70–99)
Potassium: 3.9 mmol/L (ref 3.5–5.1)
Sodium: 140 mmol/L (ref 135–145)

## 2019-03-29 LAB — SARS CORONAVIRUS 2 (TAT 6-24 HRS): SARS Coronavirus 2: NEGATIVE

## 2019-03-29 MED ORDER — BISACODYL 5 MG PO TBEC
10.0000 mg | DELAYED_RELEASE_TABLET | Freq: Every day | ORAL | Status: DC
  Administered 2019-03-29 – 2019-03-30 (×2): 10 mg via ORAL
  Filled 2019-03-29 (×2): qty 2

## 2019-03-29 MED ORDER — LISINOPRIL 5 MG PO TABS
5.0000 mg | ORAL_TABLET | Freq: Every day | ORAL | Status: DC
  Administered 2019-03-29 – 2019-03-30 (×2): 5 mg via ORAL
  Filled 2019-03-29 (×2): qty 1

## 2019-03-29 MED ORDER — CHLORHEXIDINE GLUCONATE CLOTH 2 % EX PADS: 6.0000 | MEDICATED_PAD | Freq: Every day | CUTANEOUS | Status: DC

## 2019-03-29 MED ORDER — INSULIN ASPART 100 UNIT/ML ~~LOC~~ SOLN: 0.0000 [IU] | Freq: Three times a day (TID) | SUBCUTANEOUS | Status: DC

## 2019-03-29 MED ORDER — FUROSEMIDE 10 MG/ML IJ SOLN
40.0000 mg | Freq: Two times a day (BID) | INTRAMUSCULAR | Status: DC
  Administered 2019-03-29 – 2019-03-30 (×2): 40 mg via INTRAVENOUS
  Filled 2019-03-29 (×2): qty 4

## 2019-03-29 MED ORDER — SENNOSIDES-DOCUSATE SODIUM 8.6-50 MG PO TABS
1.0000 | ORAL_TABLET | Freq: Two times a day (BID) | ORAL | Status: DC
  Administered 2019-03-29 – 2019-03-30 (×3): 1 via ORAL
  Filled 2019-03-29 (×3): qty 1

## 2019-03-29 NOTE — ED Notes (Signed)
Report called to Josefina Do floor nurse

## 2019-03-29 NOTE — ED Notes (Signed)
Daughter at bedside. NAD.

## 2019-03-29 NOTE — Consult Note (Signed)
[facility]  CARDIOLOGY CONSULT NOTE  Patient ID: Kenneth Mitchell MRN: [id number] DOB/AGE: 83/06/1933 83 y.o.  Admit date: 03/28/2019 Referring Physician Allena KatzPatel Primary Physician Elizabeth Sauereanna Jones, MD Primary Cardiologist [facility] Reason for Consultation SOB, CP, elevated troponin  HPI: [id number] year old gentleman referred for evaluation of shortness of breath, chest pain, and elevated troponin. The patient has a history of severe multivessel CAD status post CABG x 4 in 1999, severe mitral regurgitation, ischemic cardiomyopathy with LVEF 30-35%, hypertension, hyperlipidemia, AAA, carotid artery disease status post endarterectomy, and type II diabetes.  The patient has a history of recent NSTEMI last month, admitted from 11/8-11/10, underwent cardiac catheterization, which revealed multivessel severe coronary disease (nonselective injection of native left main, which suggested total occlusion, unsuccessful engagement of LIMA to LAD, 100% occluded SVG to diagonal and SVG to RCA, 75% SVG to distal circumflex. Incomplete investigation of coronary anatomy due to severe tortuosity and calcification. Medication management and referral were recommended.) 2D echocardigoram 02/28/2019 revealed LVEF 30-35% with moderate LVH, mild to moderate MR, mild TR, with multiple wall motion abnormalities.  The patient is somewhat of a poor historian and voices his frustrated about his ongoing, unresolved chest pain. The patient was seen by his primary care provider yesterday for urinary retention (Foley in place, followed by Urology) and shortness of breath, which has been present for about a year, with a recent history of orthopnea, progressive dyspnea on exertion, peripheral edema, and 9 pound weight gain in 3 days. The patient was advised to come to the ER. Per PCP's note, the patient has not been taking spironolactone and Ranexa for unknown reason. Upon my questioning, the patient states he does not know what  medications he takes. ECG in the ER revealed sinus rhythm at a rate of 72 bpm with RBBB, LAFS, with nonspecific T wave abnormalities. Chest xray revealed small right pleural effusion and stable cardiomegaly. Admission labs notable for elevated high-sensitivity troponin of 6,091, followed by 5,353 (down from 8,599 4 weeks ago),  BNP 2,779, unremarkable CBC, creatinine 1.26, BUN 20. The patient states that the chest pain he feels is "the same damn chest pain I have had for 7 years." Shortness of breath is progressively worse. He voices frustration that he cannot walk his dog like he used to due to exertional dyspnea and chest discomfort that "overcomes" him with exertion.    Review of systems complete and found to be negative unless listed above     Past Medical History:  Diagnosis Date  . CAD (coronary artery disease)   . Diabetes mellitus (HCC)   . Gross hematuria   . Hyperlipidemia   . Hypertension   . Inguinal hernia   . Nodular prostate   . Syncope     Past Surgical History:  Procedure Laterality Date  . aneurysm surgery     x 2  . LEFT HEART CATH AND CORS/GRAFTS ANGIOGRAPHY N/A 02/28/2019   Procedure: LEFT HEART CATH AND CORS/GRAFTS ANGIOGRAPHY possible PCI and stent;  Surgeon: Alwyn Peaallwood, Dwayne D, MD;  Location: [facility] INVASIVE CV LAB;  Service: Cardiovascular;  Laterality: N/A;  . quadrupal bypass      (Not in a hospital admission)  Social History   Socioeconomic History  . Marital status: Single    Spouse name: Not on file  . Number of children: Not on file  . Years of education: Not on file  . Highest education level: Not on file  Occupational History  . Not on file  Social Needs  .  Financial resource strain: Not on file  . Food insecurity    Worry: Not on file    Inability: Not on file  . Transportation needs    Medical: Not on file    Non-medical: Not on file  Tobacco Use  . Smoking status: Former Research scientist (life sciences)  . Smokeless tobacco: Never Used  . Tobacco comment: quit  20 years  Substance and Sexual Activity  . Alcohol use: No    Alcohol/week: 0.0 standard drinks  . Drug use: No  . Sexual activity: Never  Lifestyle  . Physical activity    Days per week: Not on file    Minutes per session: Not on file  . Stress: Not on file  Relationships  . Social Herbalist on phone: Not on file    Gets together: Not on file    Attends religious service: Not on file    Active member of club or organization: Not on file    Attends meetings of clubs or organizations: Not on file    Relationship status: Not on file  . Intimate partner violence    Fear of current or ex partner: Not on file    Emotionally abused: Not on file    Physically abused: Not on file    Forced sexual activity: Not on file  Other Topics Concern  . Not on file  Social History Narrative  . Not on file    Family History  Problem Relation Age of Onset  . Diabetes Mother   . Heart disease Father   . Stroke Father   . Cancer Brother   . Kidney disease Neg Hx   . Prostate cancer Neg Hx       Review of systems complete and found to be negative unless listed above      PHYSICAL EXAM  General: Well developed, well nourished, in no acute distress, sitting up in bed comfortably watching TV HEENT:  Normocephalic and atramatic Neck:  No JVD.  Lungs: normal effort of breathing on room air. Bibasilar crackles Heart: HRRR . 2/6 systolic murmur  Abdomen: nondistended Msk:  Back normal, gait not assessed, no obvious deformity Extremities: No clubbing, cyanosis or edema.   Neuro: Alert and oriented X 3. Psych:  Appears frustrated  Labs:   Lab Results  Component Value Date   WBC 10.0 03/28/2019   HGB 14.4 03/28/2019   HCT 44.8 03/28/2019   MCV 91.6 03/28/2019   PLT 121 (L) 03/28/2019    Recent Labs  Lab 03/29/19 0616  NA 140  K 3.9  CL 108  CO2 18*  BUN 20  CREATININE 1.17  CALCIUM 9.4  GLUCOSE 97   Lab Results  Component Value Date   CKTOTAL 60 06/25/2011    CKMB 2.0 06/25/2011   TROPONINI 0.08 (H) 06/25/2011    Lab Results  Component Value Date   CHOL 155 11/29/2014   CHOL 131 06/25/2011   Lab Results  Component Value Date   HDL 27 (L) 11/29/2014   HDL 29 (L) 06/25/2011   Lab Results  Component Value Date   LDLCALC 74 11/29/2014   LDLCALC 88 06/25/2011   Lab Results  Component Value Date   TRIG 271 (H) 11/29/2014   TRIG 70 06/25/2011   Lab Results  Component Value Date   CHOLHDL 5.7 (H) 11/29/2014   No results found for: LDLDIRECT    Radiology: Dg Chest 2 View  Result Date: 03/28/2019 CLINICAL DATA:  Chest pain. EXAM: CHEST -  2 VIEW COMPARISON:  February 27, 2019. FINDINGS: Stable cardiomegaly. Status post coronary bypass graft. No pneumothorax is noted. Old left rib fractures are noted. Left lung is clear. Minimal right basilar subsegmental atelectasis is noted with small right pleural effusion. IMPRESSION: Minimal right basilar subsegmental atelectasis is noted with small right pleural effusion. Stable cardiomegaly. Electronically Signed   By: Lupita Raider M.D.   On: 03/28/2019 16:54   Dg Chest 2 View  Result Date: 02/27/2019 CLINICAL DATA:  Chest pain and shortness of breath EXAM: CHEST - 2 VIEW COMPARISON:  03/11/2012 FINDINGS: Mild cardiomegaly. Status post CABG. The lungs are mildly hyperexpanded with diffusely increased interstitial markings. No pleural effusion or pneumothorax. No focal airspace consolidation or pulmonary edema. IMPRESSION: No active cardiopulmonary disease. Mild cardiomegaly and findings suggestive of COPD. Electronically Signed   By: Deatra Robinson M.D.   On: 02/27/2019 19:06   Ct Angio Chest Pe W Or Wo Contrast  Result Date: 02/28/2019 CLINICAL DATA:  Shortness of breath EXAM: CT ANGIOGRAPHY CHEST WITH CONTRAST TECHNIQUE: Multidetector CT imaging of the chest was performed using the standard protocol during bolus administration of intravenous contrast. Multiplanar CT image reconstructions and MIPs  were obtained to evaluate the vascular anatomy. CONTRAST:  55mL OMNIPAQUE IOHEXOL 350 MG/ML SOLN COMPARISON:  Chest x-ray from previous day. FINDINGS: Cardiovascular: Thoracic aorta demonstrates atherosclerotic calcifications. No significant opacification of the aorta is seen. Enlargement of the descending aorta is again seen similar to that noted in 2013 related to previous dissection. Maximum diameter is 5.4 cm. No cardiac enlargement is noted. Pulmonary artery is well opacified within normal branching pattern. No definitive filling defects are identified to suggest pulmonary emboli. Changes of prior coronary bypass grafting are noted. Mediastinum/Nodes: Visualized esophagus is within normal limits. No hilar or mediastinal adenopathy is noted. The thoracic inlet is within normal limits. Lungs/Pleura: Small right-sided pleural effusion is noted. Chronic scarring is noted within both lungs. Upper Abdomen: Visualized upper abdomen demonstrates hypodensity within the right lobe of the liver stable from the prior exam. No other focal upper abdominal abnormality is seen. Musculoskeletal: Degenerative changes of the thoracic spine are noted. Review of the MIP images confirms the above findings. IMPRESSION: No evidence of pulmonary emboli. Changes consistent with the known dissection involving the descending thoracic and upper abdominal aorta. Dilatation at the level of the aortic hiatus is noted slightly increased when compared with the prior exam. The degree of persistent opacification of the dissection is unable to be performed on this exam. Nonemergent CTA of the chest for aortic evaluation is recommended when able. Small pleural effusion on the right. Chronic interstitial scarring in the lungs bilaterally. Aortic Atherosclerosis (ICD10-I70.0). Aortic aneurysm NOS (ICD10-I71.9). Electronically Signed   By: Alcide Clever M.D.   On: 02/28/2019 02:14    EKG: sinus rhythm at a rate of 72 bpm with RBBB, LAFS, with  nonspecific T wave abnormalities  ASSESSMENT AND PLAN:  1. Acute on chronic systolic heart failure presenting with shortness of breath, orthopnea, peripheral edema, and weight gain. Likely due in part to dietary sodium and medication noncompliance.  2. Elevated troponin, likely demand supply ischemia in setting of severe coronary artery disease and acute heart failure with ischemic cardiomyopathy. Chest pain is chronic in nature. No ECG changes. 3. Severe multivessel coronary artery disease status post CABG x 4 in 1999, with recent NSTEMI last month, underwent cardiac catheterization, which revealed severe tortuosity and calcification. Medication management and referral were recommended, however the patient has not been  seen my cardiology since then, and has not been taking Ranexa for uncertain reason.  Recommendations: 1. Agree with current therapy 2. Continue IV Lasix 40 mg daily with careful monitoring of renal status 3. Continue aspirin, carvedilol, spironolactone, and Crestor 4. Optimize medical therapy with isosorbide mononitrate 60 mg daily and restarting Ranexa 500 mg BID, . 5. Agree with holding lisinopril until creatinine improves 6. Defer further cardiac diagnostics at this time, including cardiac catheterization due to known severe CAD per recent cath. 7. Further recommendations pending patient's initial course    Signed: Rockie Neighbours 03/29/2019, 8:05 AM Discussed with Dr. Darrold Junker who agrees with the above plan.

## 2019-03-29 NOTE — ED Notes (Signed)
Pt resting with no c/o pain at this time. Bed locked and low. NAD.

## 2019-03-29 NOTE — TOC Initial Note (Addendum)
Transition of Care [facility]) - Initial/Assessment Note    Patient Details  Name: Kenneth Mitchell MRN: [id number] Date of Birth: [birth year]  Transition of Care [facility]) CM/SW Contact:    Anselm Pancoast, RN Phone Number: 03/29/2019, 2:04 PM  Clinical Narrative:                 85 year old male admitted with shortness of breath and weakness. Lives alone and is independent with all care. Patient was previously discharged home with orders for home health care however did not accept the services stating "the person calling was rambling". Agreeable to discharge home with [place]. Daughter is at bedside and states she lives far away however he has friends and neighbors who are willing to assist him as needed. Patient will discharge home with foley via leg bag.   Expected Discharge Plan: [place]     Patient Goals and CMS Choice Patient states their goals for this hospitalization and ongoing recovery are:: Get back home      Expected Discharge Plan and Services Expected Discharge Plan: [place]       Living arrangements for the past 2 months: Single Family Home                                      Prior Living Arrangements/Services Living arrangements for the past 2 months: Single Family Home Lives with:: Self Patient language and need for interpreter reviewed:: Yes Do you feel safe going back to the place where you live?: Yes      Need for Family Participation in Patient Care: Yes (Comment) Care giver support system in place?: Yes (comment)   Criminal Activity/Legal Involvement Pertinent to Current Situation/Hospitalization: No - Comment as needed  Activities of Daily Living      Permission Sought/Granted Permission sought to share information with : Facility Art therapist granted to share information with : Yes, Verbal Permission Granted              Emotional Assessment Appearance:: Appears stated  age Attitude/Demeanor/Rapport: Engaged Affect (typically observed): Accepting Orientation: : Oriented to Self, Oriented to Place, Oriented to  Time, Oriented to Situation Alcohol / Substance Use: Never Used Psych Involvement: No (comment)  Admission diagnosis:  Sent by MD [facility] Patient Active Problem List   Diagnosis Date Noted  . Acute CHF (congestive heart failure) ([place]) 03/28/2019  . Elevated troponin   . Hematuria 02/28/2019  . Liver lesion 02/28/2019  . CKD (chronic kidney disease), stage IIIa 02/28/2019  . Hypertension   . Aortic dissection, thoracoabdominal ([place])   . Acute on chronic systolic CHF (congestive heart failure) ([place])   . Thrombocytopenia ([place])   . Type II diabetes mellitus with renal manifestations ([place])   . Chest pain   . NSTEMI (non-ST elevated myocardial infarction) ([place]) 02/27/2019  . Coronary artery disease involving coronary bypass graft of native heart with angina pectoris ([place]) 10/29/2018  . Acute abdominal pain 10/05/2017  . Type 2 diabetes mellitus without complication, without long-term current use of insulin ([place]) 01/30/2017  . Gross hematuria 03/26/2015  . History of elevated PSA 03/26/2015  . Nodular prostate 03/26/2015  . Insect bite 12/08/2014  . Arteriosclerosis of coronary artery 11/29/2014  . AAA (abdominal aortic aneurysm) ([place]) 10/04/2013  . Carotid artery narrowing 10/04/2013  . Dyslipidemia 10/04/2013  . BP (high blood pressure) 10/04/2013  .  Excessive urination at night 10/04/2013   PCP:  Duanne Limerick, MD Pharmacy:   [facility] [phone] [place], [place] - [address] AT [facility] [address] [place] [address] Phone: [phone] Fax: [phone]  [facility] [place], [place] - [address] [place] [address] Phone: [phone] Fax: [phone]     Social Determinants of Health (SDOH) Interventions    Readmission Risk  Interventions No flowsheet data found.

## 2019-03-29 NOTE — Evaluation (Signed)
Physical Therapy Evaluation Patient Details Name: Kenneth Mitchell MRN: [id number] DOB: [birth year] Today's Date: 03/29/2019   History of Present Illness  85 yo male recently here for chest pain, underwent cardiac cath 02/28/2019, now returns with weakness and admitted for acute CHF. PMH includes abdominal and thoracic aneurysm (x2), quadrupal bypass, DM, HTN, HLD, nodular prostate.  Clinical Impression  Pt feeling weak and more limited than his normal over the last few weeks but ultimately was able to get up and do some ambulation w/o AD and w/o assist in the ED.  He reports that he normally is very independent but neighbors are checking in almost daily and he feels good about being able to manage at home with their assist.  He initially was hesitant to have PT follow up at home, but after brief discussion was keen to the idea and acknowledges that he will need assist to work back to his baseline.  Pt's O2 remained in the mid 90s t/o the effort on room air, HR did increase to 100s (from 70s at rest) during ~70 ft of ambulation w/o AD.     Follow Up Recommendations Home health PT    Equipment Recommendations  None recommended by PT    Recommendations for Other Services       Precautions / Restrictions Precautions Precautions: Fall Restrictions Weight Bearing Restrictions: No      Mobility  Bed Mobility Overal bed mobility: Modified Independent             General bed mobility comments: Pt able to get himself to sitting without direct assist  Transfers Overall transfer level: Needs assistance Equipment used: None Transfers: Sit to/from Stand Sit to Stand: Modified independent (Device/Increase time)         General transfer comment: Pt was able to stand with confidence and w/o safety/balance issues  Ambulation/Gait Ambulation/Gait assistance: Supervision Gait Distance (Feet): 65 Feet Assistive device: None       General Gait Details: Pt staying close to wall, but did  not need to use it to keep himself steady.  Pt with slow, labored but ultimately safe ambulation.  HR increased from 70s to 100s, O2 remained in mid 90s on room air  Stairs            Wheelchair Mobility    Modified Rankin (Stroke Patients Only)       Balance Overall balance assessment: Modified Independent;Mild deficits observed, not formally tested                                           Pertinent Vitals/Pain Pain Assessment: 0-10 Pain Score: 3  Pain Location: some chronic R shld pain    Home Living Family/patient expects to be discharged to:: Private residence Living Arrangements: Alone Available Help at Discharge: Neighbor;Available PRN/intermittently Type of Home: House Home Access: Stairs to enter Entrance Stairs-Rails: None Entrance Stairs-Number of Steps: 2 Home Layout: Two level;Able to live on main level with bedroom/bathroom Home Equipment: [place] Mor - single point      Prior Function Level of Independence: Independent         Comments: no falls in the last 6 months, drives, grocery shops, cooks - reports he has not walked more than room to room for a few weeks     Hand Dominance        Extremity/Trunk Assessment   Upper Extremity Assessment Upper  Extremity Assessment: Overall WFL for tasks assessed    Lower Extremity Assessment Lower Extremity Assessment: Overall WFL for tasks assessed       Communication   Communication: No difficulties  Cognition Arousal/Alertness: Awake/alert Behavior During Therapy: WFL for tasks assessed/performed Overall Cognitive Status: Within Functional Limits for tasks assessed                                        General Comments      Exercises     Assessment/Plan    PT Assessment Patient needs continued PT services  PT Problem List Decreased strength;Decreased range of motion;Decreased activity tolerance;Decreased balance;Decreased mobility;Decreased knowledge of  use of DME;Decreased safety awareness;Cardiopulmonary status limiting activity       PT Treatment Interventions DME instruction;Gait training;Stair training;Functional mobility training;Therapeutic activities;Therapeutic exercise;Balance training;Neuromuscular re-education;Patient/family education    PT Goals (Current goals can be found in the Care Plan section)  Acute Rehab PT Goals Patient Stated Goal: go home and get back to long walks looking for golf balls with his dog PT Goal Formulation: With patient Time For Goal Achievement: 04/12/19 Potential to Achieve Goals: Fair    Frequency Min 2X/week   Barriers to discharge        Co-evaluation               AM-PAC PT "6 Clicks" Mobility  Outcome Measure Help needed turning from your back to your side while in a flat bed without using bedrails?: None Help needed moving from lying on your back to sitting on the side of a flat bed without using bedrails?: None Help needed moving to and from a bed to a chair (including a wheelchair)?: None Help needed standing up from a chair using your arms (e.g., wheelchair or bedside chair)?: None Help needed to walk in hospital room?: A Little Help needed climbing 3-5 steps with a railing? : A Little 6 Click Score: 22    End of Session Equipment Utilized During Treatment: Gait belt Activity Tolerance: Patient tolerated treatment well;Patient limited by fatigue Patient left: in bed;with call bell/phone within reach Nurse Communication: Mobility status PT Visit Diagnosis: Muscle weakness (generalized) (M62.81);Difficulty in walking, not elsewhere classified (R26.2)    Time: 1497-0263 PT Time Calculation (min) (ACUTE ONLY): 28 min   Charges:   PT Evaluation $PT Eval Low Complexity: 1 Low PT Treatments $Gait Training: 8-22 mins        Kreg Shropshire, DPT 03/29/2019, 10:35 AM

## 2019-03-29 NOTE — Progress Notes (Signed)
[place] [facility] [place] NAME: Kenneth Mitchell    MR#:  [id number]  DATE OF BIRTH:  04-Jul-1933  SUBJECTIVE:  patient came in from home with worsening shortness of breath. Daughter is in the room.  It seems patient is not been taking his meds patient has bilateral lower extremity edema. Appears frustrated saying he has been dealing with the same thing for seven years REVIEW OF SYSTEMS:   Review of Systems  Constitutional: Negative for chills, fever and weight loss.  HENT: Negative for ear discharge, ear pain and nosebleeds.   Eyes: Negative for blurred vision, pain and discharge.  Respiratory: Positive for shortness of breath. Negative for sputum production, wheezing and stridor.   Cardiovascular: Positive for orthopnea, leg swelling and PND. Negative for chest pain and palpitations.  Gastrointestinal: Negative for abdominal pain, diarrhea, nausea and vomiting.  Genitourinary: Negative for frequency and urgency.  Musculoskeletal: Negative for back pain and joint pain.  Neurological: Negative for sensory change, speech change, focal weakness and weakness.  Psychiatric/Behavioral: Negative for depression and hallucinations. The patient is not nervous/anxious.    Tolerating Diet:yes Tolerating PT: pending  DRUG ALLERGIES:  No Known Allergies  VITALS:  Blood pressure 108/83, pulse (!) 58, temperature 98 F (36.7 C), temperature source Oral, resp. rate 14, height 5\' 10"  (1.778 m), weight 87 kg, SpO2 95 %.  PHYSICAL EXAMINATION:   Physical Exam  GENERAL:  85 y.o.-year-old patient lying in the bed with no acute distress. Appears weak and debilitated EYES: Pupils equal, round, reactive to light and accommodation. No scleral icterus. Extraocular muscles intact.  HEENT: Head atraumatic, normocephalic. Oropharynx and nasopharynx clear.  NECK:  Supple, no jugular venous distention. No thyroid enlargement, no tenderness.  LUNGS decreased breath sounds  bilaterally, no wheezing, rales, rhonchi. No use of accessory muscles of respiration.  CARDIOVASCULAR: S1, S2 normal. No murmurs, rubs, or gallops.  ABDOMEN: Soft, nontender, nondistended. Bowel sounds present. No organomegaly or mass.  EXTREMITIES: No cyanosis, clubbing  +++ edema b/l.    NEUROLOGIC: Cranial nerves II through XII are intact. No focal Motor or sensory deficits b/l.   PSYCHIATRIC:  patient is alert and oriented x 3.  SKIN: No obvious rash, lesion, or ulcer.   LABORATORY PANEL:  CBC Recent Labs  Lab 03/28/19 1620  WBC 10.0  HGB 14.4  HCT 44.8  PLT 121*    Chemistries  Recent Labs  Lab 03/29/19 0616  NA 140  K 3.9  CL 108  CO2 18*  GLUCOSE 97  BUN 20  CREATININE 1.17  CALCIUM 9.4   Cardiac Enzymes No results for input(s): TROPONINI in the last 168 hours. RADIOLOGY:  Dg Chest 2 View  Result Date: 03/28/2019 CLINICAL DATA:  Chest pain. EXAM: CHEST - 2 VIEW COMPARISON:  February 27, 2019. FINDINGS: Stable cardiomegaly. Status post coronary bypass graft. No pneumothorax is noted. Old left rib fractures are noted. Left lung is clear. Minimal right basilar subsegmental atelectasis is noted with small right pleural effusion. IMPRESSION: Minimal right basilar subsegmental atelectasis is noted with small right pleural effusion. Stable cardiomegaly. Electronically Signed   By: Marijo Conception M.D.   On: 03/28/2019 16:54   ASSESSMENT AND PLAN:  Kenneth Mitchell is a 85 y.o. male with medical history significant of CAD s/p 4-vessel CABG, PAD s/p AAA repair, chronic systolic heart failure type 2 diabetes, hypertension, hyperlipidemia, and recent NSTEMI who presents with progressive worsening shortness of breath for the past month.  *  Acute on chronic systolic heart failure in the setting of medication non-compliant - echo on 02/28/2019 showed LV of 30-35%  -Takes 40 mg Lasix daily at home. Start IV 40mg  bid -Continue Coreg, spironolactone  - strict Is and Os - daily  weights -REds vest reading today  *Elevated troponin in the setting of CAD s/p four vessel CABG/recent NSTEMI in Nov - Troponin elevated to 6000 but was at around 8000 at discharge of last admission - EDP discussed with cardiology and recommended only aspirin - continue aspirin - continue Imdur, ranexa -cardiology consultation with Dr. Dec  *Mild AKI--resooved - monitor with IV diuresis  *Hypertension -d/c amlodipine with leg edema -resume lisinopril  *Urinary retention - since last admission - has foley in place - continue follow up with urology outpatient  *Type 2 diabetes - sensitive sliding scale -takes metformin  And glipizide at home which can be resumed at discharge depending on sugars  DVT prophylaxis:.Lovenox Code Status:dnr prior to admission-- discussed with patient and daughter Family Communication: Plan discussed with patient and daughter at bedside  Consults :Cardiology Discharge Disposition :home  TOTAL TIME TAKING CARE OF THIS PATIENT: *35 minutes.  >50% time spent on counselling and coordination of care  POSSIBLE D/C IN 1-2 DAYS, DEPENDING ON CLINICAL CONDITION.  Note: This dictation was prepared with Dragon dictation along with smaller phrase technology. Any transcriptional errors that result from this process are unintentional.  Cassie Freer M.D on 03/29/2019 at 5:15 PM  Between 7am to 6pm - Pager - [phone]  After 6pm go to [email or web address]  [facility]   CC: Primary care physician; 06-11-1975, MDPatient ID: Kenneth Mitchell, male   DOB: May 01, 1933, 85 y.o.   MRN: [id number]

## 2019-03-30 DIAGNOSIS — E1122 Type 2 diabetes mellitus with diabetic chronic kidney disease: Secondary | ICD-10-CM

## 2019-03-30 DIAGNOSIS — R338 Other retention of urine: Secondary | ICD-10-CM

## 2019-03-30 DIAGNOSIS — N183 Chronic kidney disease, stage 3 unspecified: Secondary | ICD-10-CM

## 2019-03-30 DIAGNOSIS — E785 Hyperlipidemia, unspecified: Secondary | ICD-10-CM

## 2019-03-30 LAB — BASIC METABOLIC PANEL
Anion gap: 8 (ref 5–15)
BUN: 22 mg/dL (ref 8–23)
CO2: 25 mmol/L (ref 22–32)
Calcium: 9.1 mg/dL (ref 8.9–10.3)
Chloride: 106 mmol/L (ref 98–111)
Creatinine, Ser: 1.43 mg/dL — ABNORMAL HIGH (ref 0.61–1.24)
GFR calc Af Amer: 52 mL/min — ABNORMAL LOW (ref >60)
GFR calc non Af Amer: 45 mL/min — ABNORMAL LOW (ref >60)
Glucose, Bld: 127 mg/dL — ABNORMAL HIGH (ref 70–99)
Potassium: 3.7 mmol/L (ref 3.5–5.1)
Sodium: 139 mmol/L (ref 135–145)

## 2019-03-30 LAB — GLUCOSE, CAPILLARY
Glucose-Capillary: 113 mg/dL — ABNORMAL HIGH (ref 70–99)
Glucose-Capillary: 102 mg/dL — ABNORMAL HIGH (ref 70–99)

## 2019-03-30 MED ORDER — HYDROXYZINE HCL 10 MG PO TABS: 10.0000 mg | ORAL_TABLET | Freq: Three times a day (TID) | ORAL | 0 refills | Status: AC | PRN

## 2019-03-30 MED ORDER — SENNOSIDES-DOCUSATE SODIUM 8.6-50 MG PO TABS: 1.0000 | ORAL_TABLET | Freq: Two times a day (BID) | ORAL | 0 refills | Status: AC | PRN

## 2019-03-30 MED ORDER — FINASTERIDE 5 MG PO TABS: 5.0000 mg | ORAL_TABLET | Freq: Every day | ORAL | 0 refills | Status: AC

## 2019-03-30 MED ORDER — RANOLAZINE ER 1000 MG PO TB12: 1000.0000 mg | ORAL_TABLET | Freq: Two times a day (BID) | ORAL | 0 refills | Status: AC

## 2019-03-30 MED ORDER — SPIRONOLACTONE 25 MG PO TABS: 25.0000 mg | ORAL_TABLET | Freq: Every day | ORAL | 0 refills | Status: AC

## 2019-03-30 NOTE — Progress Notes (Signed)
Patient refused morning labs.

## 2019-03-30 NOTE — Discharge Summary (Signed)
[facility] - [place] [facility]   PATIENT NAME: Kenneth Mitchell    MR#:  [id number]  DATE OF BIRTH:  Aug 23, 1933  DATE OF ADMISSION:  03/28/2019 ADMITTING PHYSICIAN: Anselm Jungling, DO  DATE OF DISCHARGE: 03/30/2019 12:59 PM  PRIMARY CARE PHYSICIAN: Duanne Limerick, MD    ADMISSION DIAGNOSIS:  Elevated troponin [R77.8] Acute on chronic systolic congestive heart failure (HCC) [I50.23]  DISCHARGE DIAGNOSIS:  Active Problems:   BP (high blood pressure)   Hypertension   Acute on chronic systolic congestive heart failure (HCC)   Type II diabetes mellitus with renal manifestations (HCC)   Elevated troponin   Acute CHF (congestive heart failure) (HCC)   SECONDARY DIAGNOSIS:   Past Medical History:  Diagnosis Date  . CAD (coronary artery disease)   . Diabetes mellitus (HCC)   . Gross hematuria   . Hyperlipidemia   . Hypertension   . Inguinal hernia   . Nodular prostate   . Syncope     HOSPITAL COURSE:   1.  Acute on chronic systolic congestive heart failure.  Patient was diuresed with Lasix 40 mg IV twice daily.  Since lungs are clear and creatinine starting to creep up a little bit he can go back on his Lasix 40 mg daily at home.  Continue spironolactone, lisinopril and Coreg.  Patient told me he is going home today.  He refused me calling any family members. 2.  Elevated troponin demand ischemia from acute on chronic systolic congestive heart failure.  Patient was seen by cardiology and no further work-up was ordered.  Medical management.  Restart Ranexa. 3.  Chronic kidney disease stage IIIb.  Continue to monitor as outpatient. 4.  Urinary retention with Foley.  Follow-up with urology as outpatient.  Start Proscar. 5.  Type 2 diabetes mellitus with complication of chronic kidney disease..  Sugars have been good here.  Get rid of glipizide and continue Metformin.  6.  Hyperlipidemia on Crestor  DISCHARGE CONDITIONS:   Fair  CONSULTS OBTAINED:  Treatment  Team:  Marcina Millard, MD  DRUG ALLERGIES:  No Known Allergies  DISCHARGE MEDICATIONS:   Allergies as of 03/30/2019   No Known Allergies     Medication List    STOP taking these medications   glipiZIDE 5 MG tablet Commonly known as: GLUCOTROL     TAKE these medications   amLODipine 10 MG tablet Commonly known as: NORVASC Take 1 tablet (10 mg total) by mouth daily at 6 (six) AM.   aspirin 325 MG EC tablet Take 1 tablet (325 mg total) by mouth daily.   carvedilol 6.25 MG tablet Commonly known as: COREG Take 1 tablet (6.25 mg total) by mouth 2 (two) times daily with a meal.   finasteride 5 MG tablet Commonly known as: Proscar Take 1 tablet (5 mg total) by mouth daily.   furosemide 40 MG tablet Commonly known as: LASIX Take 1 tablet (40 mg total) by mouth daily.   hydrOXYzine 10 MG tablet Commonly known as: ATARAX/VISTARIL Take 1 tablet (10 mg total) by mouth 3 (three) times daily as needed for itching.   isosorbide mononitrate 60 MG 24 hr tablet Commonly known as: IMDUR Take 1 tablet (60 mg total) by mouth daily at 6 (six) AM.   lisinopril 5 MG tablet Commonly known as: ZESTRIL Take 1 tablet (5 mg total) by mouth daily.   metFORMIN 500 MG tablet Commonly known as: GLUCOPHAGE Take 1 tablet (500 mg total) by mouth 2 (two) times  daily with a meal.   pantoprazole 40 MG tablet Commonly known as: PROTONIX Take 1 tablet (40 mg total) by mouth daily.   ranolazine 1000 MG SR tablet Commonly known as: RANEXA Take 1 tablet (1,000 mg total) by mouth 2 (two) times daily.   rosuvastatin 10 MG tablet Commonly known as: CRESTOR Take 1 tablet (10 mg total) by mouth daily.   senna-docusate 8.6-50 MG tablet Commonly known as: Senokot-S Take 1 tablet by mouth 2 (two) times daily as needed for mild constipation.   sildenafil 20 MG tablet Commonly known as: REVATIO Take 60-100 mg by mouth daily as needed.   spironolactone 25 MG tablet Commonly known as:  ALDACTONE Take 1 tablet (25 mg total) by mouth daily.        DISCHARGE INSTRUCTIONS:   Follow-up PMD 5 days Follow-up cardiology 1 to 2 weeks Follow-up CHF clinic Follow-up urology 2 weeks  If you experience worsening of your admission symptoms, develop shortness of breath, life threatening emergency, suicidal or homicidal thoughts you must seek medical attention immediately by calling 911 or calling your MD immediately  if symptoms less severe.  You Must read complete instructions/literature along with all the possible adverse reactions/side effects for all the Medicines you take and that have been prescribed to you. Take any new Medicines after you have completely understood and accept all the possible adverse reactions/side effects.   Please note  You were cared for by a hospitalist during your hospital stay. If you have any questions about your discharge medications or the care you received while you were in the hospital after you are discharged, you can call the unit and asked to speak with the hospitalist on call if the hospitalist that took care of you is not available. Once you are discharged, your primary care physician will handle any further medical issues. Please note that NO REFILLS for any discharge medications will be authorized once you are discharged, as it is imperative that you return to your primary care physician (or establish a relationship with a primary care physician if you do not have one) for your aftercare needs so that they can reassess your need for medications and monitor your lab values.    Today   CHIEF COMPLAINT:   Chief Complaint  Patient presents with  . Weight Gain    HISTORY OF PRESENT ILLNESS:  Kenneth Mitchell  is a 85 y.o. male came in with weight gain   VITAL SIGNS:  Blood pressure 127/73, pulse (!) 52, temperature 98.4 F (36.9 C), temperature source Oral, resp. rate 19, height 5\' 10"  (1.778 m), weight 87 kg, SpO2 96 %.  I/O:     Intake/Output Summary (Last 24 hours) at 03/30/2019 1503 Last data filed at 03/30/2019 0900 Gross per 24 hour  Intake -  Output 4400 ml  Net -4400 ml     PHYSICAL EXAMINATION:  GENERAL:  85 y.o.-year-old patient lying in the bed with no acute distress.  EYES: Pupils equal, round, reactive to light and accommodation. No scleral icterus. Extraocular muscles intact.  HEENT: Head atraumatic, normocephalic. Oropharynx and nasopharynx clear.  NECK:  Supple, no jugular venous distention. No thyroid enlargement, no tenderness.  LUNGS: Normal breath sounds bilaterally, no wheezing, rales,rhonchi or crepitation. No use of accessory muscles of respiration.  CARDIOVASCULAR: S1, S2 normal. No murmurs, rubs, or gallops.  ABDOMEN: Soft, non-tender, non-distended. Bowel sounds present. No organomegaly or mass.  EXTREMITIES: 2+ pedal edema.no cyanosis, or clubbing.  NEUROLOGIC: Cranial nerves II through XII  are intact. Muscle strength 5/5 in all extremities. Sensation intact. Gait not checked.  PSYCHIATRIC: The patient is alert and oriented x 3.  SKIN: No obvious rash, lesion, or ulcer.   DATA REVIEW:   CBC Recent Labs  Lab 03/28/19 1620  WBC 10.0  HGB 14.4  HCT 44.8  PLT 121*    Chemistries  Recent Labs  Lab 03/30/19 0927  NA 139  K 3.7  CL 106  CO2 25  GLUCOSE 127*  BUN 22  CREATININE 1.43*  CALCIUM 9.1     Microbiology Results  Results for orders placed or performed during the hospital encounter of 03/28/19  SARS CORONAVIRUS 2 (TAT 6-24 HRS) Nasopharyngeal Nasopharyngeal Swab     Status: None   Collection Time: 03/28/19  8:30 PM   Specimen: Nasopharyngeal Swab  Result Value Ref Range Status   SARS Coronavirus 2 NEGATIVE NEGATIVE Final    Comment: (NOTE) SARS-CoV-2 target nucleic acids are NOT DETECTED. The SARS-CoV-2 RNA is generally detectable in upper and lower respiratory specimens during the acute phase of infection. Negative results do not preclude SARS-CoV-2  infection, do not rule out co-infections with other pathogens, and should not be used as the sole basis for treatment or other patient management decisions. Negative results must be combined with clinical observations, patient history, and epidemiological information. The expected result is Negative. Fact Sheet for Patients: SugarRoll.be Fact Sheet for Healthcare Providers: https://www.woods-mathews.com/ This test is not yet approved or cleared by the [place] FDA and  has been authorized for detection and/or diagnosis of SARS-CoV-2 by FDA under an Emergency Use Authorization (EUA). This EUA will remain  in effect (meaning this test can be used) for the duration of the COVID-19 declaration under Section 56 4(b)(1) of the Act, 21 U.S.C. section 360bbb-3(b)(1), unless the authorization is terminated or revoked sooner. Performed at [facility], [place] [address] [place], [place] [address]     RADIOLOGY:  Dg Chest 2 View  Result Date: 03/28/2019 CLINICAL DATA:  Chest pain. EXAM: CHEST - 2 VIEW COMPARISON:  February 27, 2019. FINDINGS: Stable cardiomegaly. Status post coronary bypass graft. No pneumothorax is noted. Old left rib fractures are noted. Left lung is clear. Minimal right basilar subsegmental atelectasis is noted with small right pleural effusion. IMPRESSION: Minimal right basilar subsegmental atelectasis is noted with small right pleural effusion. Stable cardiomegaly. Electronically Signed   By: Marijo Conception M.D.   On: 03/28/2019 16:54     Management plans discussed with the patient, and he told me he is going home today.  Patient refused me calling family.  CODE STATUS:     Code Status Orders  (From admission, onward)         Start     Ordered   03/29/19 1415  Do not attempt resuscitation (DNR)  Continuous    Question Answer Comment  In the event of cardiac or respiratory ARREST Do not call a "code blue"   In the  event of cardiac or respiratory ARREST Do not perform Intubation, CPR, defibrillation or ACLS   In the event of cardiac or respiratory ARREST Use medication by any route, position, wound care, and other measures to relive pain and suffering. May use oxygen, suction and manual treatment of airway obstruction as needed for comfort.   Comments d/w pt and dterRobbin in the ER      03/29/19 1414        Code Status History    Date Active Date Inactive  Code Status Order ID Comments User Context   03/28/2019 2123 03/29/2019 1414 Full Code [id number]  Anselm Jungling, DO ED   02/27/2019 2043 03/01/2019 1923 Full Code [id number]  Mansy, Vernetta Honey, MD ED   10/05/2017 1556 10/06/2017 0819 DNR [id number]  Ramonita Lab, MD Inpatient   10/05/2017 1540 10/05/2017 1556 Full Code [id number]  Ramonita Lab, MD Inpatient   10/05/2017 1417 10/05/2017 1540 DNR [id number]  Ramonita Lab, MD ED   Advance Care Planning Activity      TOTAL TIME TAKING CARE OF THIS PATIENT: 35 minutes.    Alford Highland M.D on 03/30/2019 at 3:03 PM  Between 7am to 6pm - Pager - [phone]  After 6pm go to [email or web address] - password [email or web address]  [facility]  CC: Primary care physician; Duanne Limerick, MD

## 2019-03-30 NOTE — Progress Notes (Signed)
[facility] Cardiology  SUBJECTIVE: The patient denies chest pain. He states that he slept well last night. He states that his breathing is "alright." He states he wants to go home and feels better. He refused morning labs, telemetry monitoring, medications, or labs this morning, but upon further discussion with the patient about the importance of these measures, he is agreeable.   Vitals:   03/29/19 1629 03/29/19 2007 03/30/19 0343 03/30/19 0548  BP: 108/83 (!) 93/50 108/70   Pulse: (!) 58 (!) 59 (!) 53 (!) 50  Resp: 14 20 20    Temp: 98 F (36.7 C) 97.6 F (36.4 C) 98.4 F (36.9 C)   TempSrc: Oral Oral Oral   SpO2: 95% 92% 98% 98%  Weight:      Height:         Intake/Output Summary (Last 24 hours) at 03/30/2019 0857 Last data filed at 03/29/2019 1856 Gross per 24 hour  Intake -  Output 3275 ml  Net -3275 ml      PHYSICAL EXAM  General: Well developed, well nourished, in no acute distress, lying on his side in bed  HEENT:  Normocephalic and atramatic Neck:  No JVD.  Lungs: Clear bilaterally to ausculta. Heart: HRRR . 2/6 systolic murmur.  Abdomen: nondistended Msk:  Back normal, gait not assessed. Normal strength and tone for age. Extremities: No clubbing, cyanosis or edema.   Neuro: Alert and oriented X 3. Psych:  Appears frustrated, responds appropriately   LABS: Basic Metabolic Panel: Recent Labs    03/28/19 1620 03/29/19 0616  NA 139 140  K 4.0 3.9  CL 108 108  CO2 18* 18*  GLUCOSE 88 97  BUN 20 20  CREATININE 1.26* 1.17  CALCIUM 9.8 9.4   Liver Function Tests: No results for input(s): AST, ALT, ALKPHOS, BILITOT, PROT, ALBUMIN in the last 72 hours. No results for input(s): LIPASE, AMYLASE in the last 72 hours. CBC: Recent Labs    03/28/19 1620  WBC 10.0  HGB 14.4  HCT 44.8  MCV 91.6  PLT 121*   Cardiac Enzymes: No results for input(s): CKTOTAL, CKMB, CKMBINDEX, TROPONINI in the last 72 hours. BNP: Invalid input(s): POCBNP D-Dimer: No results for  input(s): DDIMER in the last 72 hours. Hemoglobin A1C: No results for input(s): HGBA1C in the last 72 hours. Fasting Lipid Panel: No results for input(s): CHOL, HDL, LDLCALC, TRIG, CHOLHDL, LDLDIRECT in the last 72 hours. Thyroid Function Tests: No results for input(s): TSH, T4TOTAL, T3FREE, THYROIDAB in the last 72 hours.  Invalid input(s): FREET3 Anemia Panel: No results for input(s): VITAMINB12, FOLATE, FERRITIN, TIBC, IRON, RETICCTPCT in the last 72 hours.  Dg Chest 2 View  Result Date: 03/28/2019 CLINICAL DATA:  Chest pain. EXAM: CHEST - 2 VIEW COMPARISON:  February 27, 2019. FINDINGS: Stable cardiomegaly. Status post coronary bypass graft. No pneumothorax is noted. Old left rib fractures are noted. Left lung is clear. Minimal right basilar subsegmental atelectasis is noted with small right pleural effusion. IMPRESSION: Minimal right basilar subsegmental atelectasis is noted with small right pleural effusion. Stable cardiomegaly. Electronically Signed   By: March 01, 2019 M.D.   On: 03/28/2019 16:54     Echo LVEF 30-35% with mild to moderate MR, mild TR, mild AR  TELEMETRY: patient refusing telemetry at this time  ASSESSMENT AND PLAN:  Active Problems:   BP (high blood pressure)   Hypertension   Acute on chronic systolic congestive heart failure (HCC)   Type II diabetes mellitus with renal manifestations (HCC)  Elevated troponin   Acute CHF (congestive heart failure) ([place])    1. Acute on chronic systolic heart failure presenting with shortness of breath, orthopnea, peripheral edema, and weight gain. Likely due in part to dietary sodium and medication noncompliance.  2. Elevated troponin, likely demand supply ischemia in setting of severe coronary artery disease and acute heart failure with ischemic cardiomyopathy. Chest pain is chronic in nature. No ECG changes. 3. Severe multivessel coronary artery disease status post CABG x 4 in 1999, with recent NSTEMI last month,  underwent cardiac catheterization, which revealed severe tortuosity and calcification. Medication management and referral were recommended, however the patient has not been seen my cardiology since then, and has not been taking Ranexa for uncertain reason. 4. Refusing medications, telemetry monitoring and labs this morning. Patient is now reluctantly agreeable.  Recommendations: 1. Agree with current therapy 2. Await results of labs this morning, and if renal function is stable, continue IV Lasix 40 mg daily with careful monitoring of renal status 3. Pending results of vitals this morning, continue carvedilol, spironolactone, and Crestor 4. Optimize medical therapy with isosorbide mononitrate 60 mg daily and restarting Ranexa 500 mg BID 5. Agree with holding lisinopril until creatinine improves 6. Defer further cardiac diagnostics at this time, including cardiac catheterization due to known severe CAD per recent cath.    Clabe Seal, PA-C 03/30/2019 8:57 AM

## 2019-03-30 NOTE — Discharge Instructions (Signed)
Cardiac Rehabilitation What is cardiac rehabilitation? Cardiac rehabilitation is a treatment program that helps improve the health and well-being of people who have heart problems. Cardiac rehabilitation includes exercise training, education, and counseling to help you get stronger and return to an active lifestyle. This program can help you get better faster and reduce any future hospital stays. Why might I need cardiac rehabilitation? Cardiac rehabilitation programs can help when you have or have had:  A heart attack.  Heart failure.  Peripheral artery disease.  Coronary artery disease.  Angina.  Lung or breathing problems. Cardiac rehabilitation programs are also used when you have had:  Coronary artery bypass graft surgery.  Heart valve replacement.  Heart stent placement.  Heart transplant.  Aneurysm repair. What are the benefits of cardiac rehabilitation? Cardiac rehabilitation can help you:  Reduce problems like chest pain and trouble breathing.  Change risk factors that contribute to heart disease, such as: ? Smoking. ? High blood pressure. ? High cholesterol. ? Diabetes. ? Being inactive. ? Weighing over 30% more than your ideal weight. ? Diet.  Improve your emotional outlook so you feel: ? More hopeful. ? Better about yourself. ? More confident about taking care of yourself.  Get support from health experts as well as other people with similar problems.  Learn healthy ways to manage stress.  Learn how to manage and understand your medicines.  Teach your family about your condition and how to participate in your recovery. What happens in cardiac rehabilitation? You will be assessed by a cardiac rehabilitation team. They will check your health history and do a physical exam. You may need blood tests, exercise stress tests, and other evaluations to make sure that you are ready to start cardiac rehabilitation. The cardiac rehabilitation team works  with you to make a plan based on your health and goals. Your program will be tailored to fit you and your needs and may change as you progress. You may work with a health care team that includes:  Doctors.  Nurses.  Dietitians.  Psychologists.  Exercise specialists.  Physical and occupational therapists. What are the phases of cardiac rehabilitation? A cardiac rehabilitation program is often divided into phases. You advance from one phase to the next. Phase 1 This phase starts while you are still in the hospital. You may:  Start by walking in your room and then in the hall.  Do some simple exercises with a therapist.  Phase 2 This phase begins when you go home or to another facility. You will travel to a cardiac rehabilitation center or another place where rehabilitation is offered. This phase may last 8-12 weeks. During this phase:  You will slowly increase your activity level while being closely watched by a nurse or therapist.  You will have medical tests and exams to monitor your progress.  Your exercises may include strength or resistance training along with activities that cause your heart to beat faster (aerobic exercises), such as walking on a treadmill.  Your condition will determine how often and how long these sessions last.  You may learn how to: ? Lacinda Axon heart-healthy meals. ? Control your blood sugar, if this applies. ? Stop smoking. ? Manage your medicines. You may need help with scheduling or planning how and when to take your medicines. If you have questions about your medicines, it is very important that you talk with your health care provider.  Phase 3 This phase continues for the rest of your life. In this phase:  There will be less supervision.  You may continue to participate in cardiac rehabilitation activities or become part of a group in your community.  You may benefit from talking about your experience with other people who are facing similar  challenges. Follow these instructions at home:  Take over-the-counter and prescription medicines only as told by your health care provider.  Keep all follow-up visits as told by your health care provider. This is important. Get help right away if:  You have severe chest discomfort, especially if the pain is crushing or pressure-like and spreads to your arms, back, neck, or jaw. Do not wait to see if the pain will go away.  You have weakness or numbness in your face, arms, or legs, especially on one side of the body.  Your speech is slurred.  You are confused.  You have a sudden, severe headache or loss of vision.  You have shortness of breath.  You are sweating and have nausea.  You feel dizzy or faint.  You are fatigued. These symptoms may represent a serious problem that is an emergency. Do not wait to see if the symptoms will go away. Get medical help right away. Call your local emergency services (911 in the [place] Do not drive yourself to the hospital. Summary  Cardiac rehabilitation is a treatment program that helps improve the health and well-being of people who have heart problems.  A cardiac rehabilitation program is often divided into phases. You advance from one phase to the next.  The cardiac rehabilitation team works with you to make a plan based on your health and goals.  Cardiac rehabilitation includes exercise training, education, and counseling to help you get stronger and return to an active lifestyle. This information is not intended to replace advice given to you by your health care provider. Make sure you discuss any questions you have with your health care provider. Document Released: 01/15/2008 Document Revised: 07/28/2018 Document Reviewed: 02/04/2018 Elsevier Patient Education  2020 Elsevier Inc.   Heart Failure, Diagnosis  Heart failure means that your heart is not able to pump blood in the right way. This makes it hard for your body to work well.  Heart failure is usually a long-term (chronic) condition. You must take good care of yourself and follow your treatment plan from your doctor. What are the causes? This condition may be caused by:  High blood pressure.  Build up of cholesterol and fat in the arteries.  Heart attack. This injures the heart muscle.  Heart valves that do not open and close properly.  Damage of the heart muscle. This is also called cardiomyopathy.  Lung disease.  Abnormal heart rhythms. What increases the risk? The risk of heart failure goes up as a person ages. This condition is also more likely to develop in people who:  Are overweight.  Are male.  Smoke or chew tobacco.  Abuse alcohol or illegal drugs.  Have taken medicines that can damage the heart.  Have diabetes.  Have abnormal heart rhythms.  Have thyroid problems.  Have low blood counts (anemia). What are the signs or symptoms? Symptoms of this condition include:  Shortness of breath.  Coughing.  Swelling of the feet, ankles, legs, or belly.  Losing weight for no reason.  Trouble breathing.  Waking from sleep because of the need to sit up and get more air.  Rapid heartbeat.  Being very tired.  Feeling dizzy, or feeling like you may pass out (faint).  Having no desire  to eat.  Feeling like you may vomit (nauseous).  Peeing (urinating) more at night.  Feeling confused. How is this treated?     This condition may be treated with:  Medicines. These can be given to treat blood pressure and to make the heart muscles stronger.  Changes in your daily life. These may include eating a healthy diet, staying at a healthy body weight, quitting tobacco and illegal drug use, or doing exercises.  Surgery. Surgery can be done to open blocked valves, or to put devices in the heart, such as pacemakers.  A donor heart (heart transplant). You will receive a healthy heart from a donor. Follow these instructions at  home:  Treat other conditions as told by your doctor. These may include high blood pressure, diabetes, thyroid disease, or abnormal heart rhythms.  Learn as much as you can about heart failure.  Get support as you need it.  Keep all follow-up visits as told by your doctor. This is important. Summary  Heart failure means that your heart is not able to pump blood in the right way.  This condition is caused by high blood pressure, heart attack, or damage of the heart muscle.  Symptoms of this condition include shortness of breath and swelling of the feet, ankles, legs, or belly. You may also feel very tired or feel like you may vomit.  You may be treated with medicines, surgery, or changes in your daily life.  Treat other health conditions as told by your doctor. This information is not intended to replace advice given to you by your health care provider. Make sure you discuss any questions you have with your health care provider. Document Released: 01/15/2008 Document Revised: 06/25/2018 Document Reviewed: 06/25/2018 Elsevier Patient Education  [place]. Heart Failure, Diagnosis  Heart failure means that your heart is not able to pump blood in the right way. This makes it hard for your body to work well. Heart failure is usually a long-term (chronic) condition. You must take good care of yourself and follow your treatment plan from your doctor. What are the causes? This condition may be caused by:  High blood pressure.  Build up of cholesterol and fat in the arteries.  Heart attack. This injures the heart muscle.  Heart valves that do not open and close properly.  Damage of the heart muscle. This is also called cardiomyopathy.  Lung disease.  Abnormal heart rhythms. What increases the risk? The risk of heart failure goes up as a person ages. This condition is also more likely to develop in people who:  Are overweight.  Are male.  Smoke or chew  tobacco.  Abuse alcohol or illegal drugs.  Have taken medicines that can damage the heart.  Have diabetes.  Have abnormal heart rhythms.  Have thyroid problems.  Have low blood counts (anemia). What are the signs or symptoms? Symptoms of this condition include:  Shortness of breath.  Coughing.  Swelling of the feet, ankles, legs, or belly.  Losing weight for no reason.  Trouble breathing.  Waking from sleep because of the need to sit up and get more air.  Rapid heartbeat.  Being very tired.  Feeling dizzy, or feeling like you may pass out (faint).  Having no desire to eat.  Feeling like you may vomit (nauseous).  Peeing (urinating) more at night.  Feeling confused. How is this treated?     This condition may be treated with:  Medicines. These can be given to treat  blood pressure and to make the heart muscles stronger.  Changes in your daily life. These may include eating a healthy diet, staying at a healthy body weight, quitting tobacco and illegal drug use, or doing exercises.  Surgery. Surgery can be done to open blocked valves, or to put devices in the heart, such as pacemakers.  A donor heart (heart transplant). You will receive a healthy heart from a donor. Follow these instructions at home:  Treat other conditions as told by your doctor. These may include high blood pressure, diabetes, thyroid disease, or abnormal heart rhythms.  Learn as much as you can about heart failure.  Get support as you need it.  Keep all follow-up visits as told by your doctor. This is important. Summary  Heart failure means that your heart is not able to pump blood in the right way.  This condition is caused by high blood pressure, heart attack, or damage of the heart muscle.  Symptoms of this condition include shortness of breath and swelling of the feet, ankles, legs, or belly. You may also feel very tired or feel like you may vomit.  You may be treated with  medicines, surgery, or changes in your daily life.  Treat other health conditions as told by your doctor. This information is not intended to replace advice given to you by your health care provider. Make sure you discuss any questions you have with your health care provider. Document Released: 01/15/2008 Document Revised: 06/25/2018 Document Reviewed: 06/25/2018 Elsevier Patient Education  2020 [employer]

## 2019-03-30 NOTE — Progress Notes (Signed)
Cardiovascular and Pulmonary Nurse Navigator Note:    Kenneth Mitchell a 85 y.o.malewith medical history significant ofCAD s/p 4-vessel CABG in 1999, PAD s/p AAA repair, chronic systolic heart failure, chronic chest pain,  type 2 diabetes, hypertension,hyperlipidemia, and recentNSTEMI who presented to the ED withprogressive worsening shortness of breath for the past month.  Patient admitted with dx of Acute on Chronic Systolic Heart Failure in the setting of dietary sodium and  medication non-compliance.  Patient had echo performed on 02/28/2019, [facility] revealed Echo LVEF 30-35% with mild to moderate MR, mild TR, mild AR.  Patient with elevated troponin in the setting of CAD s/p four vessel CABG / recent NSTEMI in November 2020. Patient has not been taking Ranexa per MD.  Uncertain as to why patient has not been taking this medication.   Troponin up to 6000 but noted to be 8000 at time of discharge of last admission.  Patient with mild AKI, which has resolved, HTN - MD d/c'ed amlodipine with leg edema.  MD resuming lisinopril.  Urinary retention since last admission - patient has chronic foley in place and will need to  follow-up with urology outpatient.    CHF Education:?? Educational session with patient completed.  Patient refused offer of  "Living Better with Heart Failure" packet. Patient stated he was not interested in that stuff.  Briefly reviewed definition of heart failure and signs and symptoms of an exacerbation.  Discussed definition of EF measurement along with normal value compared to his EF.   *Reviewed importance of and reason behind checking weight daily in the AM, after using the bathroom, but before getting dressed.  Patient stated he needed a scale. Patient stated he would start weighing himself every morning if we provided him a scale.  This RN asked TOC SW to provide patient with a scale.  *Reviewed with patient the following information:  *Discussed when to call the Dr=  weight gain of >2-3lb overnight of 5lb in a week,  *Discussed yellow zone= call MD: weight gain of >2-3lb overnight of 5lb in a week, increased swelling, increased SOB when lying down, chest discomfort, dizziness, increased fatigue  *Red Zone= call 911: struggle to breath, fainting or near fainting, significant chest pain ? ? *Diet - Patient currently ordered heart healthy carb modified diet.  Instructed patient to follow a low sodium diet of 2000 mg or less. Patient's response, "I love salt."  Encouraged patient to cut back on his sodium intake as this can cause fluid retention.  Patient's response, "No shit."  Note:  Patient explained to this RN that he cusses a lot, because that is the way he was brought up.    *Discussed fluid intake with patient as well. Patient not currently on a fluid restriction, but advised no more than 64 ounces of fluid per day.   Demonstrated this volume to patient using the bedside water pitcher. Patient verbalized understanding of this fact:   If he is drinking more than he is voiding out then he will be gaining fluid every day.    *Instructed patient to take medications as prescribed for heart failure. Explained briefly why patient is on the medications (either make you feel better, live longer or keep you out of the hospital) and discussed monitoring and side effects.  *Smoking Cessation- Patient is a FORMER smoker.    *Discussed exercise / activity. Patient infomed this RN that he is unable to exercise or do anything because of the pain he has been having for  the past 7 years.  Explained to patient he had been referred to outpatient Cardiac Rehab when he was here in November 2020 - when he had his heart attack.  Patient responds, "I did not have a heart attack in November."  Explained to patient he was here at [facility] in November and ruled in for a non-ST elevation myocardiac infarction.  Patient again denied having had a heart attack.  Patient stated, "I have been having  this pain for seven years."   This RN also reminded patient he had a cardiac cath in November and he needs to follow-up with his cardiologist about possible need for stent to LIMA per cardiology note here at [facility].  Patient did not remember having a cardiac cath in November by Dr. Clayborn Bigness.    Explained to patient he needs to be evaluated by cardiology for possible stent prior to starting Cardiac Rehab.   Patient recalled the last cardiologist he saw at [facility] was Dr. Luci Bank back  May.  Per note entered in Epic - upon arrival he was informed he was at the wrong location, so patient left without being seen.  Patient has not been seen by [facility] at [facility] since April 08, 2018 at which time he was seen by Evonnie Dawes, PA.  Explained to patient the hospitalist has made him an appointment with Vonzell Schlatter, PA, at [facility] on 04/05/2019 at 3:30 p.m.  [address], [place], [place] [address] Phone Number [phone].   Patient asked, "Where is this clinic located?"  My response, "[place]. In [place]."  Patient's response, "I do not know where this is located and I cannot get directions on my flip phone."  This RN provided patient's with turn-by-turn printed directions from [facility]. Patient reviewed the printed directions and stated, "Thank you.  I do know where this is."   Patient appreciative of these directions.    *[facility] Failure [facility] - Explained the purpose of the HF Clinic. ?Explained to patient the HF Clinic does not replace PCP nor Cardiologist, but is an additional resource to helping patient manage heart failure at home. Patient's response to info about the clinic, "I do not know where in the hell this clinic is located."  This RN informed patient this clinic is on the second floor of [facility].  Patient then asked if it is the same building as his Urology Clinic. This RN informed patient Urology is on the first floor and [facility] HF is on the second floor.  At this time patient is  agreeable to being seen in the [facility].  Patient's appointment in the [facility] HF Clinic is scheduled for 04/07/2019 at 11:30 a.m.    Patient has an appointment with his PCP, Dr. Ronnald Ramp on 03/27/2019 at 3 p.m. and with urologist on 04/13/2019.    Again, the 5 Steps to Living Better with Heart Failure were reviewed with patient.  Again, this RN encouraged patient to see his cardiologist.    Patient thanked me for providing the above information. ?  Roanna Epley, RN, BSN, [place]  [facility] Cardiac & Pulmonary Rehab  Cardiovascular & Pulmonary Nurse Navigator  Direct Line: [phone]  Department Phone #: [phone] Fax: [phone]  Email Address: [email or web address]

## 2019-03-30 NOTE — Progress Notes (Addendum)
Patient is refusing his telemetry. I explained to the patient how important it was for him to wear it and he still refused, Dr made aware. Will continue to monitor.

## 2019-03-30 NOTE — Progress Notes (Signed)
Patient is refusing to wear telemetry, oxygen, and for his vitals to be taken.

## 2019-04-01 ENCOUNTER — Other Ambulatory Visit: Payer: Self-pay

## 2019-04-01 ENCOUNTER — Emergency Department
Admission: EM | Admit: 2019-04-01 | Discharge: 2019-04-01 | Disposition: A | Discharge status: Home / Self Care | Payer: Medicare Other | Attending: Emergency Medicine | Admitting: Emergency Medicine

## 2019-04-01 ENCOUNTER — Telehealth: Payer: Self-pay | Admitting: Family Medicine

## 2019-04-01 DIAGNOSIS — Z79899 Other long term (current) drug therapy: Secondary | ICD-10-CM | POA: Diagnosis not present

## 2019-04-01 DIAGNOSIS — I130 Hypertensive heart and chronic kidney disease with heart failure and stage 1 through stage 4 chronic kidney disease, or unspecified chronic kidney disease: Secondary | ICD-10-CM | POA: Diagnosis not present

## 2019-04-01 DIAGNOSIS — Z87891 Personal history of nicotine dependence: Secondary | ICD-10-CM | POA: Diagnosis not present

## 2019-04-01 DIAGNOSIS — Z7982 Long term (current) use of aspirin: Secondary | ICD-10-CM | POA: Insufficient documentation

## 2019-04-01 DIAGNOSIS — Y69 Unspecified misadventure during surgical and medical care: Secondary | ICD-10-CM | POA: Diagnosis not present

## 2019-04-01 DIAGNOSIS — R319 Hematuria, unspecified: Secondary | ICD-10-CM | POA: Diagnosis present

## 2019-04-01 DIAGNOSIS — N183 Chronic kidney disease, stage 3 unspecified: Secondary | ICD-10-CM | POA: Diagnosis not present

## 2019-04-01 DIAGNOSIS — Z7984 Long term (current) use of oral hypoglycemic drugs: Secondary | ICD-10-CM | POA: Insufficient documentation

## 2019-04-01 DIAGNOSIS — I2510 Atherosclerotic heart disease of native coronary artery without angina pectoris: Secondary | ICD-10-CM | POA: Diagnosis not present

## 2019-04-01 DIAGNOSIS — I509 Heart failure, unspecified: Secondary | ICD-10-CM | POA: Diagnosis not present

## 2019-04-01 DIAGNOSIS — E1122 Type 2 diabetes mellitus with diabetic chronic kidney disease: Secondary | ICD-10-CM | POA: Insufficient documentation

## 2019-04-01 DIAGNOSIS — T83098A Other mechanical complication of other indwelling urethral catheter, initial encounter: Secondary | ICD-10-CM | POA: Diagnosis not present

## 2019-04-01 DIAGNOSIS — T839XXA Unspecified complication of genitourinary prosthetic device, implant and graft, initial encounter: Secondary | ICD-10-CM | POA: Diagnosis not present

## 2019-04-01 NOTE — Discharge Instructions (Addendum)
Next scheduled appointment for urology is on January 4 at 10:45 AM.  If hematuria continues greater than 12 hours return back to the emergency room.

## 2019-04-01 NOTE — ED Notes (Signed)
See triage note   States he pulled out his foley cath about 2 am   Has been bleeding from penis since

## 2019-04-01 NOTE — Telephone Encounter (Signed)
Spoke to patient he states his insides were hurting so he pulled the catheter out in the middle of the night. He states he has been bleeding heavily since 2 AM. I informed him there is a balloon attached to the end of the catheter that helps keep the catheter in the bladder. He said well I know that now. I informed him to go to the ER to be evaluated to ensure he did not do any internal damage. Patient voiced understanding.

## 2019-04-01 NOTE — ED Triage Notes (Signed)
Pt states that he was having a pain deep in his bladder last pm and he pulled the foley catheter out and his penis has been bleeding since 0200. Pt states that he has been able to void twice since, uncertain if he takes a blood thinner

## 2019-04-01 NOTE — ED Notes (Signed)
#   16 f/5 ml foley inserted w/o diff  Bloody urine returned

## 2019-04-01 NOTE — ED Provider Notes (Signed)
[facility] Emergency Department Provider Note  ____________________________________________   None    (approximate)  I have reviewed the triage vital signs and the nursing notes.   HISTORY  Chief Complaint Hematuria  {HPI Kenneth Mitchell is a 85 y.o. male patient state last night he was having pain in his bladder and he pulled his Foley catheter patient state he was unaware that the Foley catheter had a balloon holding in place.  Patient states after he dislodged the Foley cath he started having bleeding from the penis.  Patient state he was able to void twice and pulling the catheter out.  Patient had discomfort with Foley catheter in the past and seen his urologist who state is mostly positional since he left the bag hanging down to his ankles instead of keeping on his thigh.  This is put pressure on the catheter.  It was explained to the patient to avoid further complications.      Past Medical History:  Diagnosis Date  . CAD (coronary artery disease)   . Diabetes mellitus (HCC)   . Gross hematuria   . Hyperlipidemia   . Hypertension   . Inguinal hernia   . Nodular prostate   . Syncope     Patient Active Problem List   Diagnosis Date Noted  . Acute urinary retention   . Hyperlipidemia   . Acute CHF (congestive heart failure) (HCC) 03/28/2019  . Elevated troponin   . Hematuria 02/28/2019  . Liver lesion 02/28/2019  . CKD stage 3 due to type 2 diabetes mellitus (HCC) 02/28/2019  . Hypertension   . Aortic dissection, thoracoabdominal (HCC)   . Acute on chronic systolic congestive heart failure (HCC)   . Thrombocytopenia (HCC)   . Type II diabetes mellitus with renal manifestations (HCC)   . Chest pain   . NSTEMI (non-ST elevated myocardial infarction) (HCC) 02/27/2019  . Coronary artery disease involving coronary bypass graft of native heart with angina pectoris (HCC) 10/29/2018  . Acute abdominal pain 10/05/2017  . Type 2 diabetes mellitus  without complication, without long-term current use of insulin (HCC) 01/30/2017  . Gross hematuria 03/26/2015  . History of elevated PSA 03/26/2015  . Nodular prostate 03/26/2015  . Insect bite 12/08/2014  . Arteriosclerosis of coronary artery 11/29/2014  . AAA (abdominal aortic aneurysm) (HCC) 10/04/2013  . Carotid artery narrowing 10/04/2013  . Dyslipidemia 10/04/2013  . BP (high blood pressure) 10/04/2013  . Excessive urination at night 10/04/2013    Past Surgical History:  Procedure Laterality Date  . aneurysm surgery     x 2  . LEFT HEART CATH AND CORS/GRAFTS ANGIOGRAPHY N/A 02/28/2019   Procedure: LEFT HEART CATH AND CORS/GRAFTS ANGIOGRAPHY possible PCI and stent;  Surgeon: Alwyn Pea, MD;  Location: [facility] INVASIVE CV LAB;  Service: Cardiovascular;  Laterality: N/A;  . quadrupal bypass      Prior to Admission medications   Medication Sig Start Date End Date Taking? Authorizing Provider  amLODipine (NORVASC) 10 MG tablet Take 1 tablet (10 mg total) by mouth daily at 6 (six) AM. 10/29/18   Duanne Limerick, MD  aspirin EC 325 MG EC tablet Take 1 tablet (325 mg total) by mouth daily. 03/02/19   Lorretta Harp, MD  carvedilol (COREG) 6.25 MG tablet Take 1 tablet (6.25 mg total) by mouth 2 (two) times daily with a meal. 10/29/18   Duanne Limerick, MD  finasteride (PROSCAR) 5 MG tablet Take 1 tablet (5 mg total) by mouth  daily. 03/30/19 03/29/20  Loletha Grayer, MD  furosemide (LASIX) 40 MG tablet Take 1 tablet (40 mg total) by mouth daily. 10/29/18   Juline Patch, MD  hydrOXYzine (ATARAX/VISTARIL) 10 MG tablet Take 1 tablet (10 mg total) by mouth 3 (three) times daily as needed for itching. 03/30/19   Loletha Grayer, MD  isosorbide mononitrate (IMDUR) 60 MG 24 hr tablet Take 1 tablet (60 mg total) by mouth daily at 6 (six) AM. 03/02/19   Ivor Costa, MD  lisinopril (ZESTRIL) 5 MG tablet Take 1 tablet (5 mg total) by mouth daily. 10/29/18 10/29/19  Juline Patch, MD  metFORMIN  (GLUCOPHAGE) 500 MG tablet Take 1 tablet (500 mg total) by mouth 2 (two) times daily with a meal. 04/16/18   Juline Patch, MD  pantoprazole (PROTONIX) 40 MG tablet Take 1 tablet (40 mg total) by mouth daily. 03/02/19   Ivor Costa, MD  ranolazine (RANEXA) 1000 MG SR tablet Take 1 tablet (1,000 mg total) by mouth 2 (two) times daily. 03/30/19   Loletha Grayer, MD  rosuvastatin (CRESTOR) 10 MG tablet Take 1 tablet (10 mg total) by mouth daily. 10/29/18   Juline Patch, MD  senna-docusate (SENOKOT-S) 8.6-50 MG tablet Take 1 tablet by mouth 2 (two) times daily as needed for mild constipation. 03/30/19   Loletha Grayer, MD  sildenafil (REVATIO) 20 MG tablet Take 60-100 mg by mouth daily as needed.     [provider]  spironolactone (ALDACTONE) 25 MG tablet Take 1 tablet (25 mg total) by mouth daily. 03/30/19   Loletha Grayer, MD    Allergies Patient has no known allergies.  Family History  Problem Relation Age of Onset  . Diabetes Mother   . Heart disease Father   . Stroke Father   . Cancer Brother   . Kidney disease Neg Hx   . Prostate cancer Neg Hx     Social History Social History   Tobacco Use  . Smoking status: Former Research scientist (life sciences)  . Smokeless tobacco: Never Used  . Tobacco comment: quit 20 years  Substance Use Topics  . Alcohol use: No    Alcohol/week: 0.0 standard drinks  . Drug use: No    Review of Systems Constitutional: No fever/chills Eyes: No visual changes. ENT: No sore throat. Cardiovascular: Denies chest pain. Respiratory: Denies shortness of breath. Gastrointestinal: No abdominal pain.  No nausea, no vomiting.  No diarrhea.  No constipation. Genitourinary: Negative for dysuria.  Hematuria status post forceful catheter removal.  Musculoskeletal: Negative for back pain.   Skin: Negative for rash. Neurological: Negative for headaches, focal weakness or numbness. Endocrine:  Hyperlipidemia and  hypertension.  ____________________________________________   PHYSICAL EXAM:  VITAL SIGNS: ED Triage Vitals  Enc Vitals Group     BP 04/01/19 1019 (!) 141/79     Pulse Rate 04/01/19 1019 66     Resp 04/01/19 1019 18     Temp 04/01/19 1019 (!) 97.5 F (36.4 C)     Temp Source 04/01/19 1019 Oral     SpO2 04/01/19 1019 100 %     Weight --      Height --      Head Circumference --      Peak Flow --      Pain Score 04/01/19 1020 0     Pain Loc --      Pain Edu? --      Excl. in [place]? --    Constitutional: Alert and oriented. Well appearing and in  no acute distress. Cardiovascular: Normal rate, regular rhythm. Grossly normal heart sounds.  Good peripheral circulation. Respiratory: Normal respiratory effort.  No retractions. Lungs CTAB. Gastrointestinal: Soft and nontender. No distention. No abdominal bruits. No CVA tenderness. Genitourinary: Clotted blood at the meatus. Musculoskeletal: No lower extremity tenderness nor edema.  No joint effusions. Neurologic:  Normal speech and language. No gross focal neurologic deficits are appreciated. No gait instability. Skin:  Skin is warm, dry and intact. No rash noted. Psychiatric: Mood and affect are normal. Speech and behavior are normal.  ____________________________________________   LABS (all labs ordered are listed, but only abnormal results are displayed)  Labs Reviewed - No data to display ____________________________________________  EKG   ____________________________________________  RADIOLOGY  ED MD interpretation:    Official radiology report(s): No results found.  ____________________________________________   PROCEDURES  Procedure(s) performed (including Critical Care):  Procedures   ____________________________________________   INITIAL IMPRESSION / ASSESSMENT AND PLAN / ED COURSE  As part of my medical decision making, I reviewed the following data within the electronic MEDICAL RECORD NUMBER      Hematuria secondary to forceful Foley catheter removal without deflating retention bulb.  New Foley inserted.  Patient advised to follow-up with schedule urology appointment.  Advised patient if bleeding increases or continues greater than 12 hours return back to ED.    Kenneth Mitchell was evaluated in Emergency Department on 04/01/2019 for the symptoms described in the history of present illness. He was evaluated in the context of the global COVID-19 pandemic, which necessitated consideration that the patient might be at risk for infection with the SARS-CoV-2 virus that causes COVID-19. Institutional protocols and algorithms that pertain to the evaluation of patients at risk for COVID-19 are in a state of rapid change based on information released by regulatory bodies including the CDC and federal and state organizations. These policies and algorithms were followed during the patient's care in the ED.       ____________________________________________   FINAL CLINICAL IMPRESSION(S) / ED DIAGNOSES  Final diagnoses:  Foley catheter problem, initial encounter [facility](HCC)     ED Discharge Orders    None       Note:  This document was prepared using Dragon voice recognition software and may include unintentional dictation errors.    Joni ReiningSmith, Yaritza Leist K, PA-C 04/01/19 1138    Dionne BucySiadecki, Sebastian, MD 04/01/19 [phone]

## 2019-04-04 ENCOUNTER — Other Ambulatory Visit: Payer: Self-pay

## 2019-04-04 ENCOUNTER — Encounter: Payer: Self-pay | Admitting: Family Medicine

## 2019-04-04 ENCOUNTER — Telehealth: Payer: Self-pay

## 2019-04-04 ENCOUNTER — Ambulatory Visit (INDEPENDENT_AMBULATORY_CARE_PROVIDER_SITE_OTHER): Payer: Medicare Other | Admitting: Family Medicine

## 2019-04-04 VITALS — BP 118/68 | HR 64

## 2019-04-04 DIAGNOSIS — I159 Secondary hypertension, unspecified: Secondary | ICD-10-CM

## 2019-04-04 DIAGNOSIS — I25709 Atherosclerosis of coronary artery bypass graft(s), unspecified, with unspecified angina pectoris: Secondary | ICD-10-CM | POA: Diagnosis not present

## 2019-04-04 DIAGNOSIS — Z9119 Patient's noncompliance with other medical treatment and regimen: Secondary | ICD-10-CM

## 2019-04-04 DIAGNOSIS — E7801 Familial hypercholesterolemia: Secondary | ICD-10-CM

## 2019-04-04 DIAGNOSIS — I214 Non-ST elevation (NSTEMI) myocardial infarction: Secondary | ICD-10-CM

## 2019-04-04 DIAGNOSIS — I340 Nonrheumatic mitral (valve) insufficiency: Secondary | ICD-10-CM

## 2019-04-04 DIAGNOSIS — Z91199 Patient's noncompliance with other medical treatment and regimen due to unspecified reason: Secondary | ICD-10-CM

## 2019-04-04 NOTE — Progress Notes (Addendum)
Date:  04/04/2019   Name:  Kenneth Mitchell   DOB:  1934/03/06   MRN:  [id number]   Chief Complaint: face to face (Michelle/ pt's daughter is in room- pt has been asked if he is 'okay' with [place] proceeding. he has said yes.)  Patient is a 85 year old male who presents for a face to face for homehealth exam. The patient reports the following problems: DOE/anginal concern/. Health maintenance has been reviewed up    Lab Results  Component Value Date   CREATININE 1.43 (H) 03/30/2019   BUN 22 03/30/2019   NA 139 03/30/2019   K 3.7 03/30/2019   CL 106 03/30/2019   CO2 25 03/30/2019   Lab Results  Component Value Date   CHOL 155 11/29/2014   HDL 27 (L) 11/29/2014   LDLCALC 74 11/29/2014   TRIG 271 (H) 11/29/2014   CHOLHDL 5.7 (H) 11/29/2014   No results found for: TSH Lab Results  Component Value Date   HGBA1C 6.5 (H) 02/28/2019     Review of Systems  Constitutional: Negative for chills and fever.  HENT: Negative for drooling, ear discharge, ear pain and sore throat.   Respiratory: Positive for cough and shortness of breath. Negative for wheezing.   Cardiovascular: Positive for chest pain. Negative for palpitations and leg swelling.  Gastrointestinal: Negative for abdominal pain, blood in stool, constipation, diarrhea and nausea.  Endocrine: Negative for polydipsia.  Genitourinary: Negative for dysuria, enuresis, frequency, hematuria and urgency.  Musculoskeletal: Negative for back pain, myalgias and neck pain.  Skin: Negative for rash.  Allergic/Immunologic: Negative for environmental allergies.  Neurological: Negative for dizziness and headaches.  Hematological: Does not bruise/bleed easily.  Psychiatric/Behavioral: Negative for suicidal ideas. The patient is not nervous/anxious.     Patient Active Problem List   Diagnosis Date Noted  . Acute urinary retention   . Hyperlipidemia   . Acute CHF (congestive heart failure) ([place]) 03/28/2019  . Elevated troponin   .  Hematuria 02/28/2019  . Liver lesion 02/28/2019  . CKD stage 3 due to type 2 diabetes mellitus ([place]) 02/28/2019  . Hypertension   . Aortic dissection, thoracoabdominal ([place])   . Acute on chronic systolic congestive heart failure ([place])   . Thrombocytopenia ([place])   . Type II diabetes mellitus with renal manifestations ([place])   . Chest pain   . NSTEMI (non-ST elevated myocardial infarction) ([place]) 02/27/2019  . Coronary artery disease involving coronary bypass graft of native heart with angina pectoris ([place]) 10/29/2018  . Acute abdominal pain 10/05/2017  . Type 2 diabetes mellitus without complication, without long-term current use of insulin ([place]) 01/30/2017  . Gross hematuria 03/26/2015  . History of elevated PSA 03/26/2015  . Nodular prostate 03/26/2015  . Insect bite 12/08/2014  . Arteriosclerosis of coronary artery 11/29/2014  . AAA (abdominal aortic aneurysm) ([place]) 10/04/2013  . Carotid artery narrowing 10/04/2013  . Dyslipidemia 10/04/2013  . BP (high blood pressure) 10/04/2013  . Excessive urination at night 10/04/2013    No Known Allergies  Past Surgical History:  Procedure Laterality Date  . aneurysm surgery     x 2  . LEFT HEART CATH AND CORS/GRAFTS ANGIOGRAPHY N/A 02/28/2019   Procedure: LEFT HEART CATH AND CORS/GRAFTS ANGIOGRAPHY possible PCI and stent;  Surgeon: Yolonda Kida, MD;  Location: [place] CV LAB;  Service: Cardiovascular;  Laterality: N/A;  . quadrupal bypass      Social History   Tobacco Use  . Smoking status: Former Research scientist (life sciences)  .  Smokeless tobacco: Never Used  . Tobacco comment: quit 20 years  Substance Use Topics  . Alcohol use: No    Alcohol/week: 0.0 standard drinks  . Drug use: No     Medication list has been reviewed and updated.  Current Meds  Medication Sig  . amLODipine (NORVASC) 10 MG tablet Take 1 tablet (10 mg total) by mouth daily at 6 (six) AM.  . aspirin EC 325 MG EC tablet Take 1 tablet (325 mg total) by mouth daily.  .  carvedilol (COREG) 6.25 MG tablet Take 1 tablet (6.25 mg total) by mouth 2 (two) times daily with a meal.  . finasteride (PROSCAR) 5 MG tablet Take 1 tablet (5 mg total) by mouth daily.  . furosemide (LASIX) 40 MG tablet Take 1 tablet (40 mg total) by mouth daily.  Marland Kitchen glipiZIDE (GLUCOTROL) 5 MG tablet Take 5 mg by mouth daily before breakfast.  . hydrOXYzine (ATARAX/VISTARIL) 10 MG tablet Take 1 tablet (10 mg total) by mouth 3 (three) times daily as needed for itching.  . isosorbide mononitrate (IMDUR) 60 MG 24 hr tablet Take 1 tablet (60 mg total) by mouth daily at 6 (six) AM.  . lisinopril (ZESTRIL) 5 MG tablet Take 1 tablet (5 mg total) by mouth daily.  . metFORMIN (GLUCOPHAGE) 500 MG tablet Take 1 tablet (500 mg total) by mouth 2 (two) times daily with a meal.  . pantoprazole (PROTONIX) 40 MG tablet Take 1 tablet (40 mg total) by mouth daily.  . ranolazine (RANEXA) 1000 MG SR tablet Take 1 tablet (1,000 mg total) by mouth 2 (two) times daily.  . rosuvastatin (CRESTOR) 10 MG tablet Take 1 tablet (10 mg total) by mouth daily.  Marland Kitchen senna-docusate (SENOKOT-S) 8.6-50 MG tablet Take 1 tablet by mouth 2 (two) times daily as needed for mild constipation.  . sildenafil (REVATIO) 20 MG tablet Take 60-100 mg by mouth daily as needed.   Marland Kitchen spironolactone (ALDACTONE) 25 MG tablet Take 1 tablet (25 mg total) by mouth daily.    PHQ 2/9 Scores 04/23/2017 01/30/2017 01/11/2016 11/29/2014  PHQ - 2 Score 0 0 0 0  PHQ- 9 Score 1 - - -    BP Readings from Last 3 Encounters:  04/04/19 118/68  04/01/19 (!) 141/79  03/30/19 127/73    Physical Exam Vitals reviewed.  HENT:     Head: Normocephalic.     Right Ear: External ear normal.     Left Ear: External ear normal.     Nose: Nose normal.  Eyes:     General: No scleral icterus.       Right eye: No discharge.        Left eye: No discharge.     Conjunctiva/sclera: Conjunctivae normal.     Pupils: Pupils are equal, round, and reactive to light.  Neck:      Thyroid: No thyromegaly.     Vascular: No JVD.     Trachea: No tracheal deviation.  Cardiovascular:     Rate and Rhythm: Normal rate and regular rhythm.     Heart sounds: Normal heart sounds. No murmur. No friction rub. No gallop.   Pulmonary:     Effort: No respiratory distress.     Breath sounds: Normal breath sounds. No wheezing or rales.  Abdominal:     General: Bowel sounds are normal.     Palpations: Abdomen is soft. There is no mass.     Tenderness: There is no abdominal tenderness. There is no guarding or rebound.  Musculoskeletal:        General: No tenderness. Normal range of motion.     Cervical back: Normal range of motion and neck supple.  Lymphadenopathy:     Cervical: No cervical adenopathy.  Skin:    General: Skin is warm.     Findings: No rash.  Neurological:     Mental Status: He is alert and oriented to person, place, and time.     Cranial Nerves: No cranial nerve deficit.     Deep Tendon Reflexes: Reflexes are normal and symmetric.     Wt Readings from Last 3 Encounters:  03/28/19 191 lb 12.8 oz (87 kg)  03/28/19 193 lb (87.5 kg)  03/25/19 184 lb (83.5 kg)    BP 118/68   Pulse 64   SpO2 97%   Assessment and Plan: 1. Coronary artery disease involving coronary bypass graft of native heart with angina pectoris [facility]) Patient is follow-up from the hospital.  Patient states that he has been having chest pain for several years that no one seems to be able to evaluate.  Apparently attempts to do catheterization via femoral or radial artery has not been met with possibilities.  I do not know if it can be approached in a different way either subclavian or or other means but at some point in time both the coronary artery disease as well as the evaluation of the mitral valve needs to be conducted.  Patient has a cardiac appointment at [facility] tomorrow at which time he will bring up the nature discontinued chest pain.  It may be that they cannot do anything further in  terms of standing and that medication may be the only means of control.  Patient in the meantime will continue his Ranexa 1 g SR daily as well as his isosorbide 60 mg every 24 hours.   2. Nonrheumatic mitral valve regurgitation Patient had a mitral valve regurgitation of a significant nature that has been unable to be reevaluated.  I am not certain if patient can be cath to determine what this is but this will be one of the concerns that will be brought out when the patient is evaluated in cardiac clinic at [facility] tomorrow.  3. NSTEMI (non-ST elevated myocardial infarction) [facility]) Patient denies this but patient had a non-STEMI MI on a recent admission to [facility].  Patient is doing relatively well as long as he takes his medications but this is been an ongoing issue with the patient.  4. Secondary hypertension Chronic.  Controlled.  Stable.  Continue current regimen of amlodipine 10 mg, carvedilol 6.25 twice a day, lisinopril 5 mg, and spironolactone 25 mg/furosemide 40 mg once a day.  5. Familial hypercholesterolemia Chronic.  Controlled.  Stable.  Continue Crestor 10 mg once a day.  6. Noncompliance This is an ongoing problem with the patient whether he takes his medication when he takes his medication and whether he will follow-up with evaluations.  This is been discussed with the patient and family as to being able to continue this.  We will attempt to obtain home health following this as his face-to-face if the patient continues to drive he knows that he will not be able to continue with home health.  Spoke to patient about the importance of taking his rosuvastatin and lisinopril on a regular basis and having prescriptions assessed on a timely. basis.

## 2019-04-04 NOTE — Telephone Encounter (Signed)
Pt came in for face- to- face with daughter

## 2019-04-06 ENCOUNTER — Inpatient Hospital Stay: Payer: Medicare Other | Admitting: Family Medicine

## 2019-04-06 NOTE — Progress Notes (Signed)
Patient ID: Kenneth Mitchell, male    DOB: [birth year], 85 y.o.   MRN: [id number]  HPI  Kenneth Mitchell is a 85 y/o male with a history of CAD, DM, hyperlipidemia, HTN, hematuria, former tobacco use and chronic heart failure.   Echo report from 02/28/2019 reviewed and showed an EF of 30-35% along with mild/moderate Kenneth, mild TR/AR and moderately elevated PA pressure.   LHC done 02/28/2019 and showed:  Ost RCA lesion is 100% stenosed.  Mid LM lesion is 100% stenosed.  Ost Cx lesion is 100% stenosed.  Dist Graft lesion is 75% stenosed.  SVG to RCA 100% occluded from previously  SVG to diagonal 100% previously occluded  Unsuccessful engagement of LIMA to LAD  Nonselective injection of native left main which suggested total occlusion Conclusion: Multivessel severe coronary disease Incomplete investigation of coronary anatomy because of severe tortuosity and calcification Recommend referral as an outpatient for possible intervention of SVG to circumflex 75% distal lesion Consider referral for investigation of possible left radial approach for evaluation of LIMA to LAD Recommend medical therapy with Imdur Ranexa until outpatient referral   Was in the ED 04/01/2019 due to hematuria due to patient pulling foley catheter out without deflating the balloon. Foley replaced by provider and he was released. Admitted 03/28/2019 due to acute on chronic HF. Cardiology consult obtained. Initially given IV lasix and then transitioned to oral medications. Elevated troponin thought to be due to demand ischemia. Has foley catheter due to urinary retention. Discharged after 2 days.   He presents today for his initial visit with a chief complaint of moderate shortness of breath upon minimal exertion. He describes this as chronic in nature having been present for a few years. He has associated fatigue and hematuria along with this. He denies any dizziness, abdominal distention, palpitations, pedal edema, chest pain,  cough or head congestion. He says that he just feels "shitty".   Says that he has scales and knows he should weigh daily but he doesn't. Knows that he shouldn't add salt but says that he adds it to "everything" except when he is hospitalized. Showed up 2 hours early for his cardiology appointment and left without being seen as the provider didn't see him early. He says this has been rescheduled for January 2021 he thinks.   Past Medical History:  Diagnosis Date  . CAD (coronary artery disease)   . CHF (congestive heart failure) ([place])   . Diabetes mellitus ([place])   . Gross hematuria   . Hyperlipidemia   . Hypertension   . Inguinal hernia   . Nodular prostate   . Syncope    Past Surgical History:  Procedure Laterality Date  . aneurysm surgery     x 2  . LEFT HEART CATH AND CORS/GRAFTS ANGIOGRAPHY N/A 02/28/2019   Procedure: LEFT HEART CATH AND CORS/GRAFTS ANGIOGRAPHY possible PCI and stent;  Surgeon: Yolonda Kida, MD;  Location: [facility];  Service: Cardiovascular;  Laterality: N/A;  . quadrupal bypass     Family History  Problem Relation Age of Onset  . Diabetes Mother   . Heart disease Father   . Stroke Father   . Cancer Brother   . Kidney disease Neg Hx   . Prostate cancer Neg Hx    Social History   Tobacco Use  . Smoking status: Former Research scientist (life sciences)  . Smokeless tobacco: Never Used  . Tobacco comment: quit 20 years  Substance Use Topics  . Alcohol use: No  Alcohol/week: 0.0 standard drinks   No Known Allergies Prior to Admission medications   Medication Sig Start Date End Date Taking? Authorizing Provider  amLODipine (NORVASC) 10 MG tablet Take 1 tablet (10 mg total) by mouth daily at 6 (six) AM. 10/29/18  Yes Duanne Limerick, MD  aspirin EC 325 MG EC tablet Take 1 tablet (325 mg total) by mouth daily. 03/02/19  Yes Lorretta Harp, MD  carvedilol (COREG) 6.25 MG tablet Take 1 tablet (6.25 mg total) by mouth 2 (two) times daily with a meal. 10/29/18  Yes Duanne Limerick, MD  finasteride (PROSCAR) 5 MG tablet Take 1 tablet (5 mg total) by mouth daily. 03/30/19 03/29/20 Yes Wieting, Richard, MD  furosemide (LASIX) 40 MG tablet Take 1 tablet (40 mg total) by mouth daily. 10/29/18  Yes Duanne Limerick, MD  glipiZIDE (GLUCOTROL) 5 MG tablet Take 5 mg by mouth daily before breakfast.   Yes [provider]  hydrOXYzine (ATARAX/VISTARIL) 10 MG tablet Take 1 tablet (10 mg total) by mouth 3 (three) times daily as needed for itching. 03/30/19  Yes Alford Highland, MD  isosorbide mononitrate (IMDUR) 60 MG 24 hr tablet Take 1 tablet (60 mg total) by mouth daily at 6 (six) AM. 03/02/19  Yes Lorretta Harp, MD  lisinopril (ZESTRIL) 5 MG tablet Take 1 tablet (5 mg total) by mouth daily. 10/29/18 10/29/19 Yes Duanne Limerick, MD  metFORMIN (GLUCOPHAGE) 500 MG tablet Take 1 tablet (500 mg total) by mouth 2 (two) times daily with a meal. 04/16/18  Yes Duanne Limerick, MD  pantoprazole (PROTONIX) 40 MG tablet Take 1 tablet (40 mg total) by mouth daily. 03/02/19  Yes Lorretta Harp, MD  ranolazine (RANEXA) 1000 MG SR tablet Take 1 tablet (1,000 mg total) by mouth 2 (two) times daily. 03/30/19  Yes Wieting, Richard, MD  rosuvastatin (CRESTOR) 10 MG tablet Take 1 tablet (10 mg total) by mouth daily. 10/29/18  Yes Duanne Limerick, MD  senna-docusate (SENOKOT-S) 8.6-50 MG tablet Take 1 tablet by mouth 2 (two) times daily as needed for mild constipation. 03/30/19  Yes Wieting, Richard, MD  sildenafil (REVATIO) 20 MG tablet Take 60-100 mg by mouth daily as needed.    Yes [provider]  spironolactone (ALDACTONE) 25 MG tablet Take 1 tablet (25 mg total) by mouth daily. 03/30/19  Yes Alford Highland, MD    Review of Systems  Constitutional: Positive for fatigue. Negative for appetite change.  HENT: Negative for congestion, postnasal drip and sore throat.   Eyes: Negative.   Respiratory: Positive for shortness of breath (moderate). Negative for cough and chest tightness.    Cardiovascular: Negative for chest pain, palpitations and leg swelling.  Gastrointestinal: Negative for abdominal distention and abdominal pain.  Endocrine: Negative.   Genitourinary: Positive for hematuria (has foley catheter).  Musculoskeletal: Negative for back pain and neck pain.  Skin: Negative.   Allergic/Immunologic: Negative.   Neurological: Negative for dizziness and light-headedness.  Hematological: Negative for adenopathy. Does not bruise/bleed easily.  Psychiatric/Behavioral: Negative for dysphoric mood and sleep disturbance. The patient is not nervous/anxious.     Vitals:   04/07/19 1142  BP: 106/60  Pulse: (!) 58  Resp: 15  Weight: 184 lb 6.4 oz (83.6 kg)  Height: 5\' 11"  (1.803 m)   Wt Readings from Last 3 Encounters:  04/07/19 184 lb 6.4 oz (83.6 kg)  03/28/19 191 lb 12.8 oz (87 kg)  03/28/19 193 lb (87.5 kg)   Lab Results  Component Value  Date   CREATININE 1.43 (H) 03/30/2019   CREATININE 1.17 03/29/2019   CREATININE 1.26 (H) 03/28/2019     Physical Exam Vitals and nursing note reviewed.  HENT:     Head: Normocephalic and atraumatic.  Cardiovascular:     Rate and Rhythm: Regular rhythm. Bradycardia present.  Pulmonary:     Effort: Pulmonary effort is normal. No respiratory distress.     Breath sounds: No wheezing or rales.  Abdominal:     General: Abdomen is flat. There is no distension.     Palpations: Abdomen is soft.  Musculoskeletal:        General: No tenderness.     Cervical back: Normal range of motion and neck supple.     Right lower leg: Edema (1+ pitting) present.     Left lower leg: Edema (1+ pitting) present.  Skin:    General: Skin is warm and dry.  Neurological:     General: No focal deficit present.     Mental Status: He is alert and oriented to person, place, and time.  Psychiatric:        Behavior: Behavior normal.        Thought Content: Thought content normal.    Assessment & Plan:  1: Chronic heart failure with  reduced ejection fraction- - NYHA class III - euvolemic today - was supposed to see cardiology Adriana Simas([place]) 04/05/2019 but says that he showed up 2 hours early for his appointment and the provider "never came" so he left; he says that his daughter has R/S the appointment - has scales but says that he doesn't weigh himself daily; encouraged him to weigh every morning and call for an overnight weight gain of >2 pounds or a weekly weight gain of >5 pounds - adds salt to "everything" except when he's hospitalized; refused the low sodium cookbook but did take the dietary information about keeping daily sodium intake to 2000mg / day - BP too low to change to entresto - bradycardic so unable to titrate up carvedilol  - BNP 03/28/2019 was 2779.0 - says that he didn't get the flu vaccine and isn't planning to get it; emphasized good handwashing - encouraged him to elevate his legs when sitting for long periods of time  2: HTN- - BP good today although on the low side (106/60) - saw PCP Yetta Barre([place]) 04/04/2019 - BMP 03/30/2019 reviewed and showed sodium 139, potassium 3.7, creatinine 1.43 and GFR 45  3: DM- - saw endocrinology Gershon Crane([place]) 03/11/2019 - nonfasting glucose in clinic today was 115 - A1c 02/28/2019 was 6.5%  Medication list was reviewed.   Return in 2 months or sooner for any questions/problems before then.

## 2019-04-07 ENCOUNTER — Ambulatory Visit: Payer: Medicare Other | Attending: Family | Admitting: Family

## 2019-04-07 ENCOUNTER — Other Ambulatory Visit: Payer: Self-pay

## 2019-04-07 ENCOUNTER — Encounter: Payer: Self-pay | Admitting: Family

## 2019-04-07 VITALS — BP 106/60 | HR 58 | Resp 15 | Ht 71.0 in | Wt 184.4 lb

## 2019-04-07 DIAGNOSIS — E785 Hyperlipidemia, unspecified: Secondary | ICD-10-CM | POA: Diagnosis not present

## 2019-04-07 DIAGNOSIS — E119 Type 2 diabetes mellitus without complications: Secondary | ICD-10-CM | POA: Insufficient documentation

## 2019-04-07 DIAGNOSIS — Z7984 Long term (current) use of oral hypoglycemic drugs: Secondary | ICD-10-CM | POA: Insufficient documentation

## 2019-04-07 DIAGNOSIS — I110 Hypertensive heart disease with heart failure: Secondary | ICD-10-CM | POA: Insufficient documentation

## 2019-04-07 DIAGNOSIS — I2510 Atherosclerotic heart disease of native coronary artery without angina pectoris: Secondary | ICD-10-CM | POA: Diagnosis not present

## 2019-04-07 DIAGNOSIS — Z87891 Personal history of nicotine dependence: Secondary | ICD-10-CM | POA: Diagnosis not present

## 2019-04-07 DIAGNOSIS — E1129 Type 2 diabetes mellitus with other diabetic kidney complication: Secondary | ICD-10-CM

## 2019-04-07 DIAGNOSIS — I5020 Unspecified systolic (congestive) heart failure: Secondary | ICD-10-CM | POA: Insufficient documentation

## 2019-04-07 DIAGNOSIS — Z7982 Long term (current) use of aspirin: Secondary | ICD-10-CM | POA: Insufficient documentation

## 2019-04-07 DIAGNOSIS — Z79899 Other long term (current) drug therapy: Secondary | ICD-10-CM | POA: Insufficient documentation

## 2019-04-07 DIAGNOSIS — I10 Essential (primary) hypertension: Secondary | ICD-10-CM

## 2019-04-07 DIAGNOSIS — I5022 Chronic systolic (congestive) heart failure: Secondary | ICD-10-CM

## 2019-04-07 LAB — GLUCOSE, CAPILLARY: Glucose-Capillary: 115 mg/dL — ABNORMAL HIGH (ref 70–99)

## 2019-04-07 NOTE — Patient Instructions (Addendum)
Begin weighing daily and call for an overnight weight gain of > 2 pounds or a weekly weight gain of >5 pounds. 

## 2019-04-12 ENCOUNTER — Encounter: Payer: Self-pay | Admitting: Family Medicine

## 2019-04-18 ENCOUNTER — Ambulatory Visit: Payer: Medicare Other | Admitting: Physician Assistant

## 2019-04-22 DEATH — deceased

## 2019-04-25 ENCOUNTER — Ambulatory Visit: Payer: Medicare Other | Admitting: Urology

## 2019-06-08 ENCOUNTER — Ambulatory Visit: Payer: Medicare Other | Admitting: Family

## 2019-09-04 IMAGING — CT CT ABD-PELV W/ CM
2 of 6 series · 13 of 46 positions shown, 15 images · IV contrast (APPLIED)
Comparison: 04/04/2015 prior CTs

CLINICAL DATA: 83-year-old male with acute abdominal and pelvic
pain.

EXAM:
CT ABDOMEN AND PELVIS WITH CONTRAST
TECHNIQUE: Multidetector CT imaging of the abdomen and pelvis was performed
using the standard protocol following bolus administration of
intravenous contrast.
CONTRAST:  100 cc intravenous Omnipaque 300

[Series 2: routine abd/pel with · axial · 0.80mm/px · z∈[-478,-63]mm · 10 of 103 slices shown, 12 images]
[im 10/103  soft-tissue]
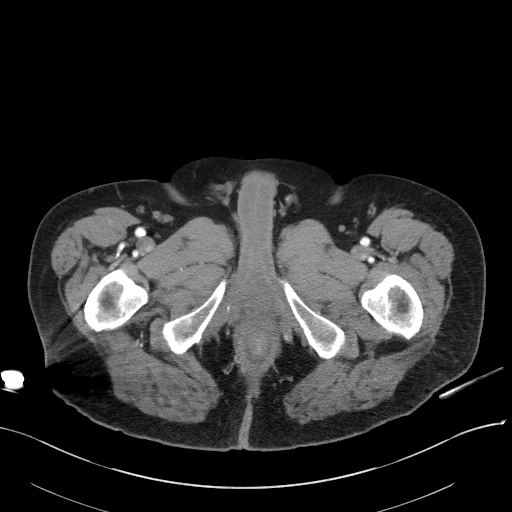
[im 10/103  bone]
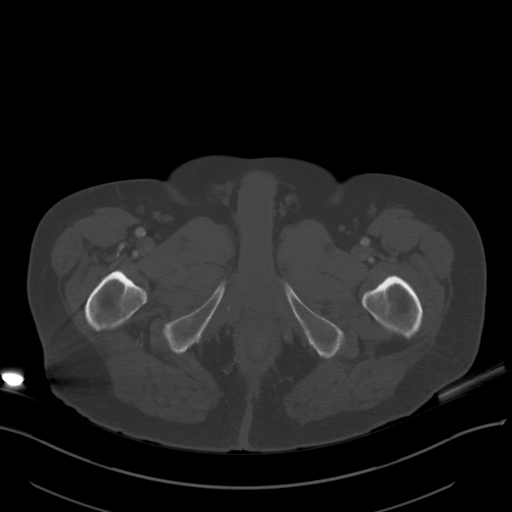
[im 19/103  soft-tissue]
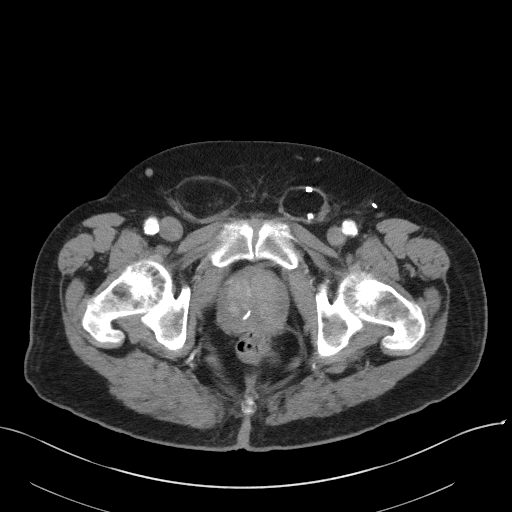
[im 28/103  soft-tissue]
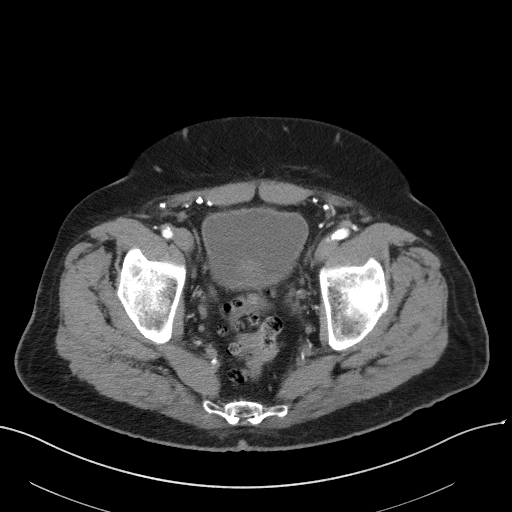
[im 38/103  soft-tissue]
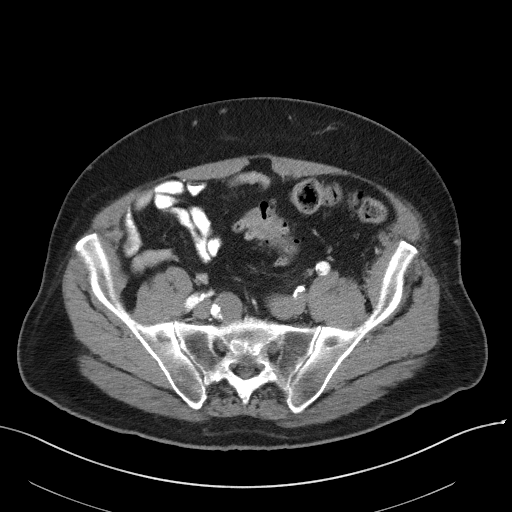
[im 47/103  soft-tissue]
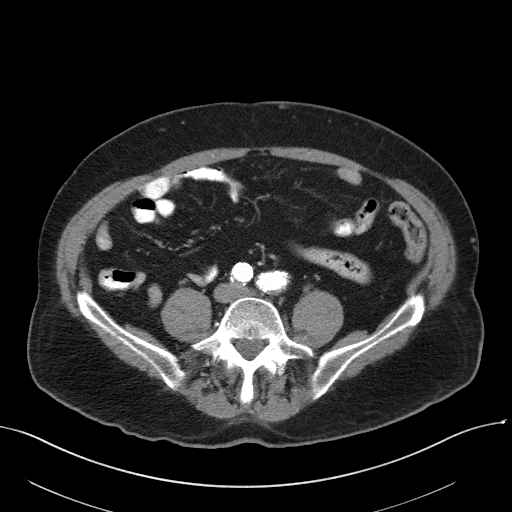
[im 56/103  soft-tissue]
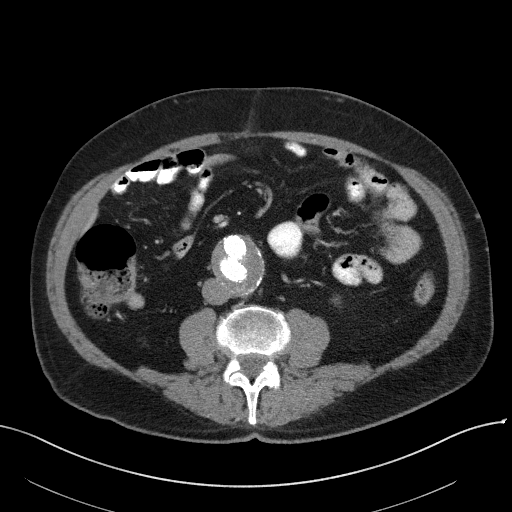
[im 65/103  soft-tissue]
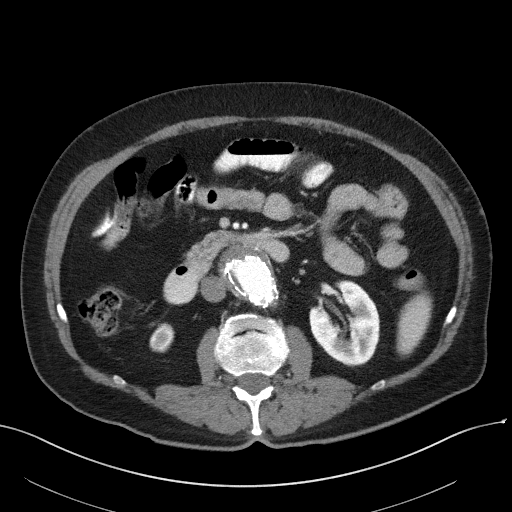
[im 75/103  soft-tissue]
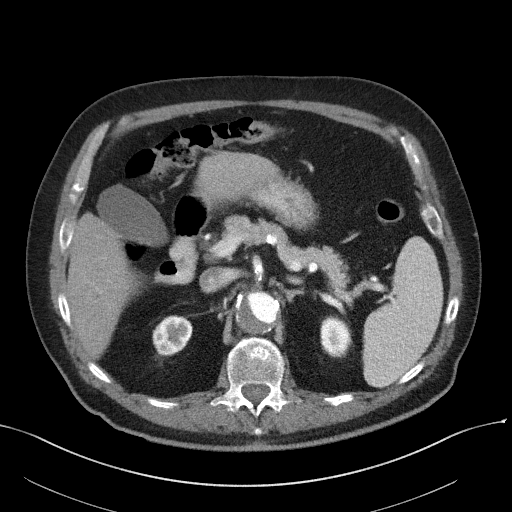
[im 84/103  soft-tissue]
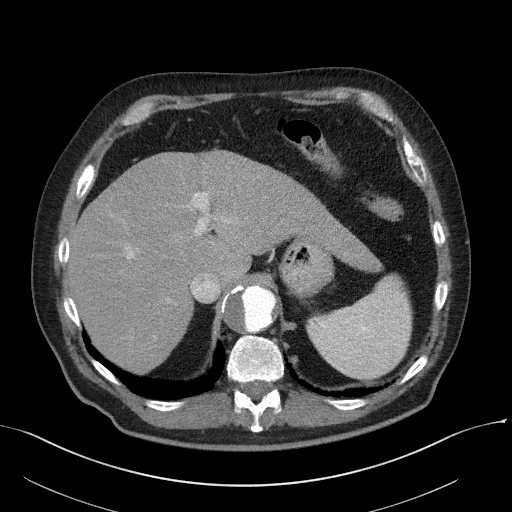
[im 84/103  bone]
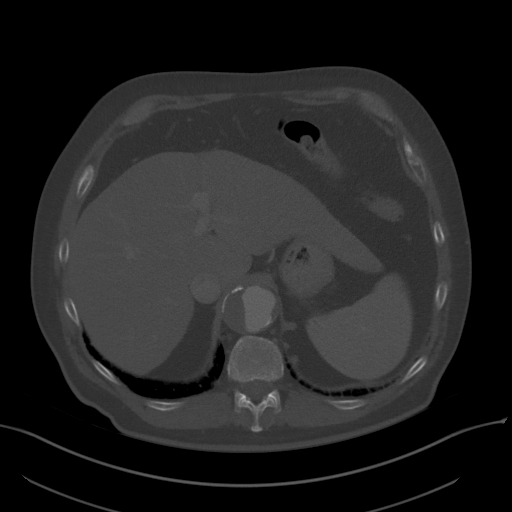
[im 93/103  soft-tissue]
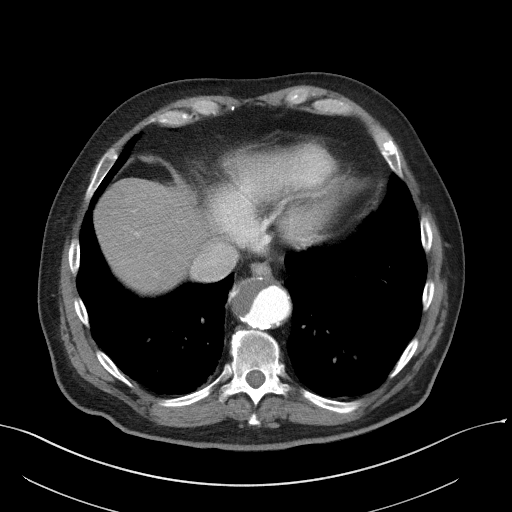

[Series 6: coronal st · coronal · 0.76mm/px · 3 of 98 slices shown]
[im 33/98  soft-tissue]
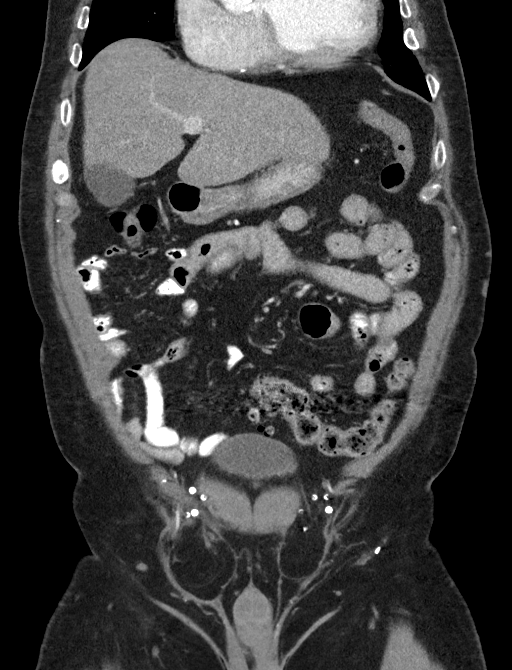
[im 44/98  soft-tissue]
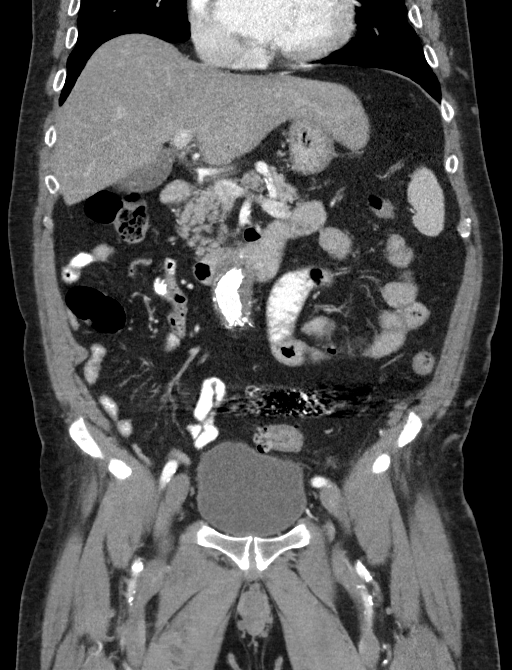
[im 54/98  soft-tissue]
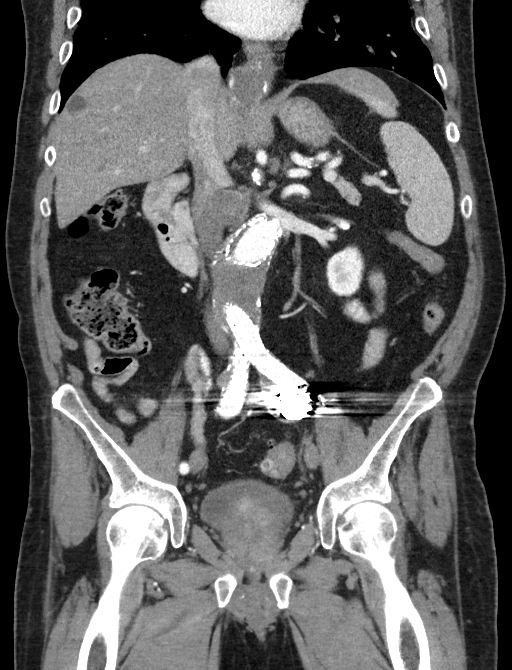

[13 of 46 positions shown; findings below may reference images not displayed]

FINDINGS: Lower chest: No acute abnormality. Cardiomegaly and cardiac surgical
changes identified.

Hepatobiliary: Hepatic steatosis and a RIGHT hepatic cyst again
identified. The gallbladder is unremarkable. No biliary dilatation.

Pancreas: Unremarkable

Spleen: Unremarkable

Adrenals/Urinary Tract: The RIGHT kidney is severely atrophic. There
is no evidence of hydronephrosis or renal mass. The adrenal glands
and bladder are unremarkable.

Stomach/Bowel: Stomach is within normal limits. Appendix appears
normal. No evidence of bowel wall thickening, distention, or
inflammatory changes. Colonic diverticulosis noted without evidence
of diverticulitis.

Vascular/Lymphatic: Abdominal aortic aneurysm and aorto bi-iliac
stent graft again noted. Possible type 2 endoleak posteriorly
(series 4: Image 44) noted. The native aneurysm sac measures up to
4.5 cm and unchanged from 5716.

There is enlargement of a pseudoaneurysm extending off of the
anterior RIGHT aspect of the abdominal aorta just above the stent
graft measuring 2.7 cm (series [DATE]), previously measuring 2 cm on
04/04/2015. No adjacent hemorrhage is identified.

A 3 cm pseudoaneurysm extending off of the RIGHT internal iliac
artery (series [DATE]) previously measured 2.5 cm in 5716.

Atherosclerotic calcification within the renal and mesenteric
arteries noted, which are grossly patent.

Reproductive: Marked prostate enlargement again noted.

Other: No ascites, focal collection, acute hemorrhage or
pneumoperitoneum. Moderate to large bilateral inguinal hernias
containing fat are present.

Musculoskeletal: No acute or suspicious bony abnormalities.
IMPRESSION: 1. Abdominal aortic aneurysm and aorto bi-iliac stent graft again
noted with possible type 2 endoleak. No significant change in native
aneurysm sac size (4.5 cm) since [DATE]. Enlarging pseudoaneurysms since 5716 extending off of the
anterior RIGHT abdominal aorta above the stent graft (now 2.7 cm,
previously 2 cm) and extending off of the RIGHT internal iliac
artery (now 3 cm, previously 2.5 cm). No evidence of adjacent
hemorrhage/hematoma.
3. Hepatic steatosis.
4. Cardiomegaly
5. Marked prostate enlargement
6. Atrophic RIGHT kidney
7. Moderate to large bilateral inguinal hernias containing fat.

## 2021-02-24 IMAGING — CR DG CHEST 2V
2 series · 2 of 2 positions shown · non-contrast
Comparison: February 27, 2019.

CLINICAL DATA: Chest pain.

EXAM:
CHEST - 2 VIEW

[chest pa]
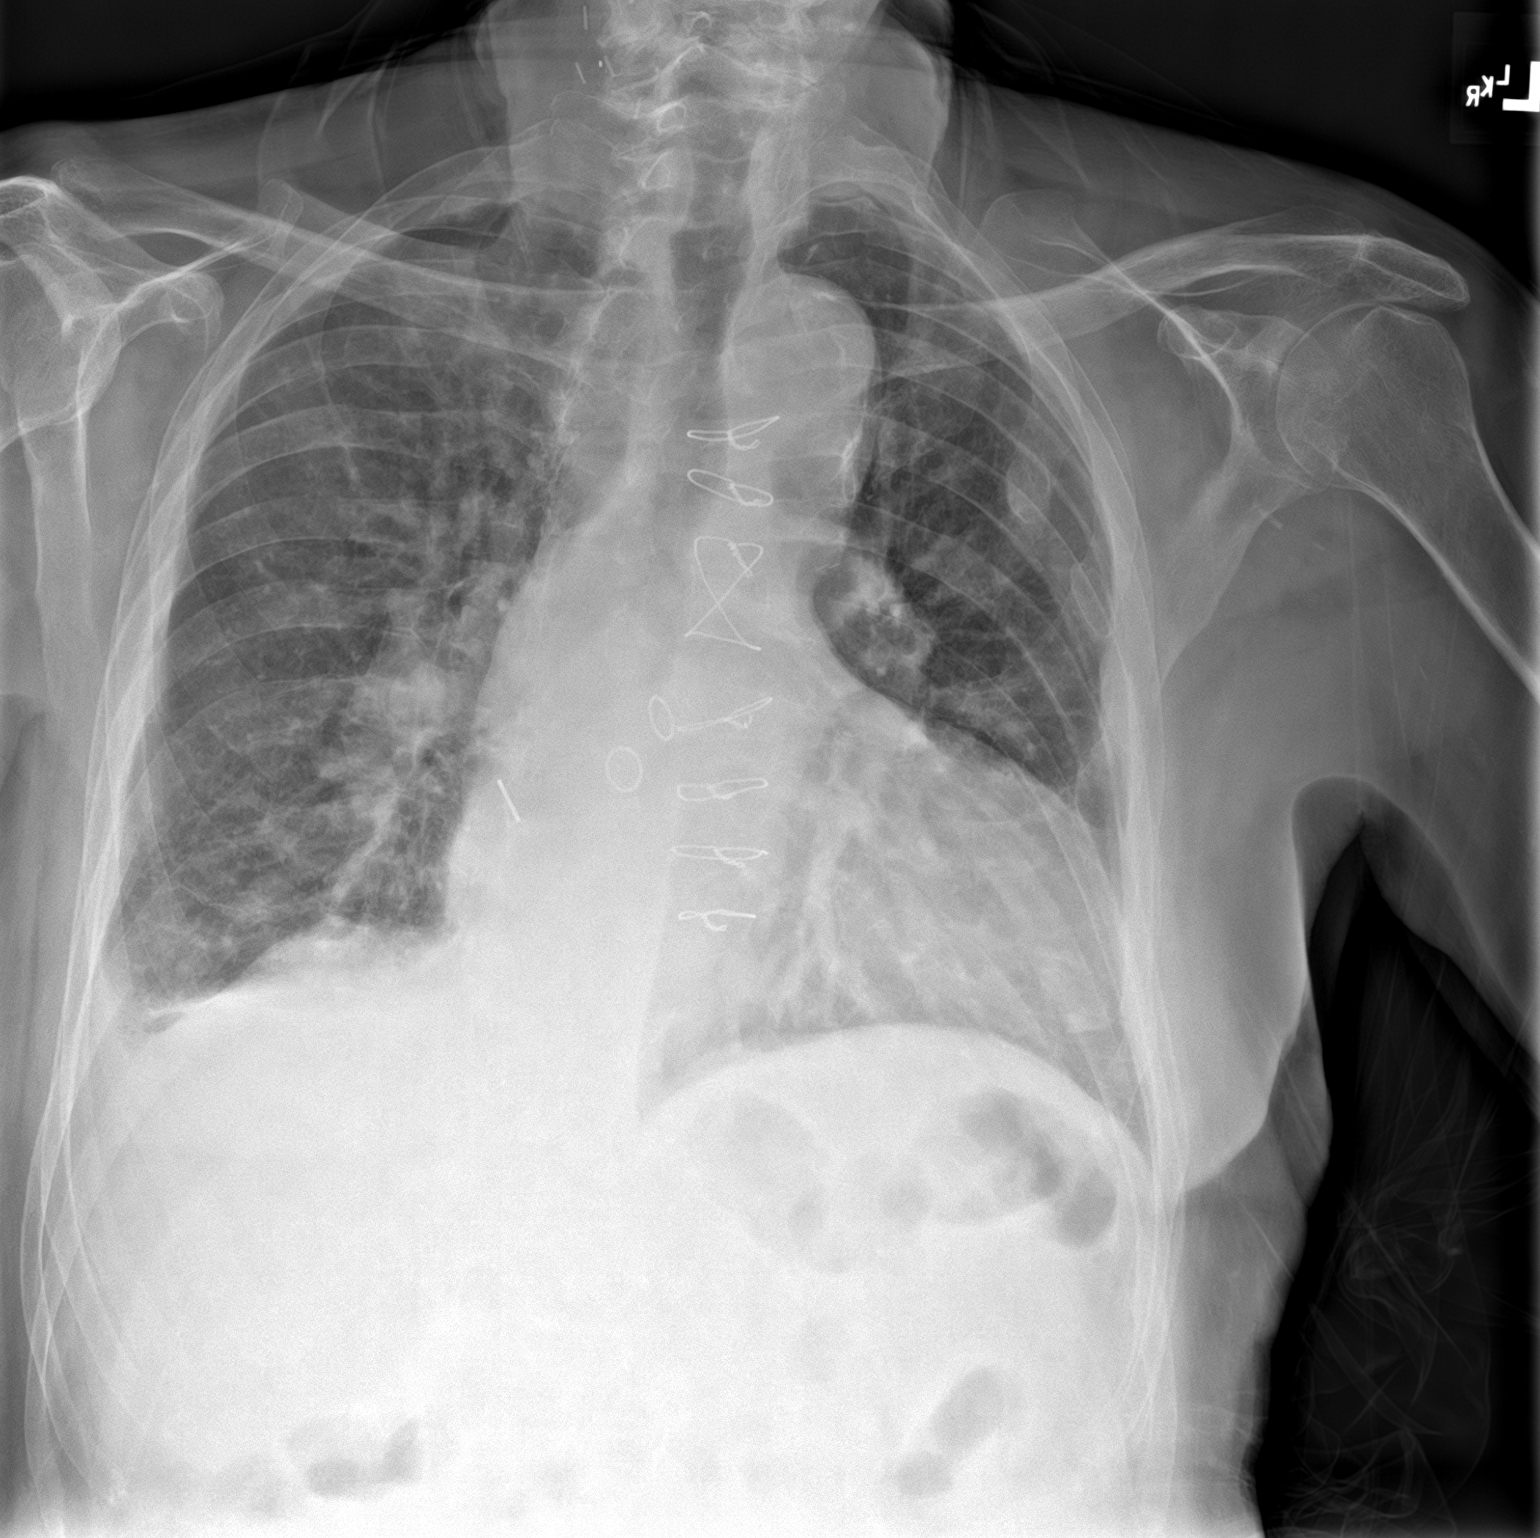

[chest lat]
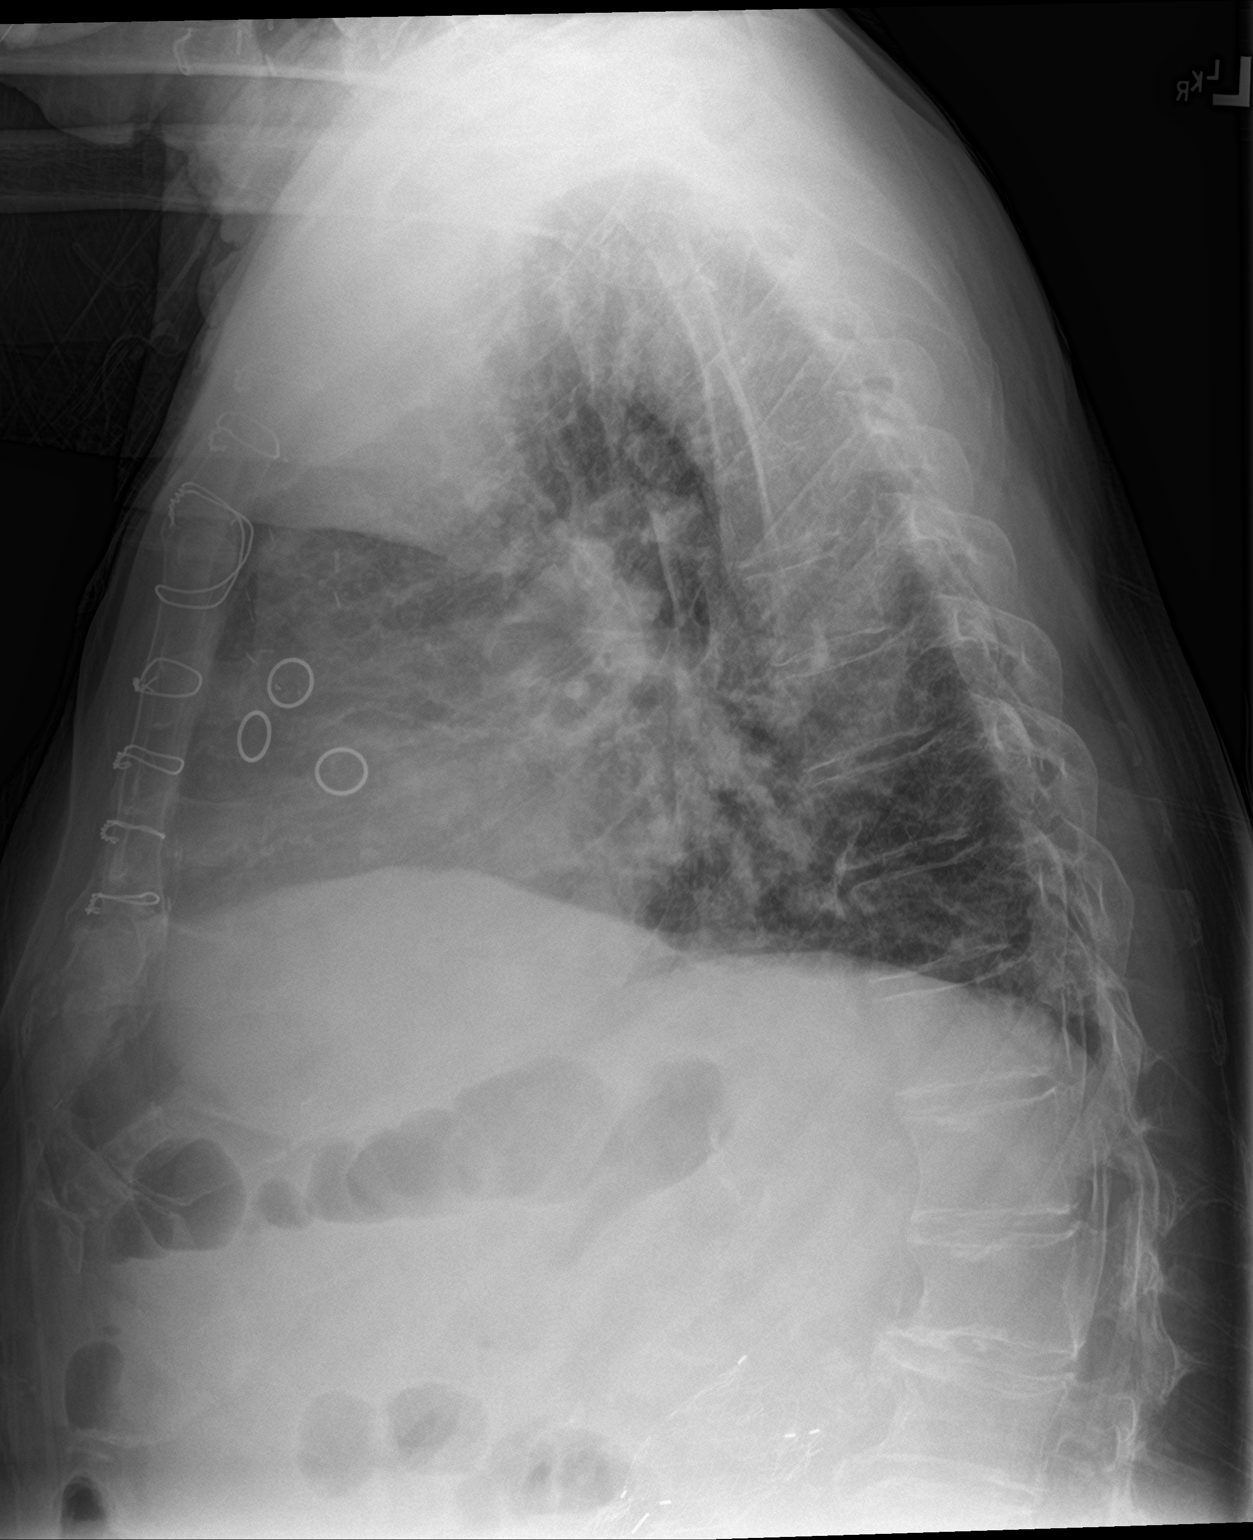

[2 of 2 positions shown; findings below may reference images not displayed]

FINDINGS: Stable cardiomegaly. Status post coronary bypass graft. No
pneumothorax is noted. Old left rib fractures are noted. Left lung
is clear. Minimal right basilar subsegmental atelectasis is noted
with small right pleural effusion.
IMPRESSION: Minimal right basilar subsegmental atelectasis is noted with small
right pleural effusion. Stable cardiomegaly.
